# Patient Record
Sex: Female | Born: 1940 | Race: Asian | Hispanic: No | Marital: Married | Smoking: Never smoker
Health system: Southern US, Community
[De-identification: ages and names within clinical notes are randomized; demographics above are authoritative.]

## PROBLEM LIST (undated history)

## (undated) DIAGNOSIS — I10 Essential (primary) hypertension: Secondary | ICD-10-CM

## (undated) DIAGNOSIS — I5189 Other ill-defined heart diseases: Secondary | ICD-10-CM

## (undated) DIAGNOSIS — I213 ST elevation (STEMI) myocardial infarction of unspecified site: Secondary | ICD-10-CM

## (undated) DIAGNOSIS — E785 Hyperlipidemia, unspecified: Secondary | ICD-10-CM

## (undated) DIAGNOSIS — J45909 Unspecified asthma, uncomplicated: Secondary | ICD-10-CM

## (undated) DIAGNOSIS — I639 Cerebral infarction, unspecified: Secondary | ICD-10-CM

## (undated) DIAGNOSIS — R630 Anorexia: Secondary | ICD-10-CM

## (undated) DIAGNOSIS — D333 Benign neoplasm of cranial nerves: Secondary | ICD-10-CM

## (undated) DIAGNOSIS — G919 Hydrocephalus, unspecified: Secondary | ICD-10-CM

## (undated) HISTORY — DX: Hydrocephalus, unspecified: G91.9

## (undated) HISTORY — DX: Benign neoplasm of cranial nerves: D33.3

## (undated) HISTORY — DX: ST elevation (STEMI) myocardial infarction of unspecified site: I21.3

## (undated) HISTORY — DX: Hyperlipidemia, unspecified: E78.5

## (undated) HISTORY — DX: Other ill-defined heart diseases: I51.89

---

## 2007-12-17 ENCOUNTER — Emergency Department: Payer: Self-pay | Admitting: Emergency Medicine

## 2010-05-19 ENCOUNTER — Emergency Department: Payer: Self-pay | Admitting: Unknown Physician Specialty

## 2010-06-29 ENCOUNTER — Emergency Department: Payer: Self-pay | Admitting: Emergency Medicine

## 2010-07-03 ENCOUNTER — Emergency Department: Payer: Self-pay | Admitting: Emergency Medicine

## 2011-08-03 DIAGNOSIS — I213 ST elevation (STEMI) myocardial infarction of unspecified site: Secondary | ICD-10-CM

## 2011-08-03 HISTORY — DX: ST elevation (STEMI) myocardial infarction of unspecified site: I21.3

## 2011-08-18 ENCOUNTER — Inpatient Hospital Stay (HOSPITAL_COMMUNITY)
Admission: EM | Admit: 2011-08-18 | Discharge: 2011-08-23 | DRG: 249 | Disposition: A | Discharge status: Home Health | Payer: Medicare Other | Attending: Cardiology | Admitting: Cardiology

## 2011-08-18 ENCOUNTER — Encounter (HOSPITAL_COMMUNITY): Payer: Self-pay

## 2011-08-18 ENCOUNTER — Other Ambulatory Visit: Payer: Self-pay

## 2011-08-18 ENCOUNTER — Encounter (HOSPITAL_COMMUNITY)
Admission: EM | Disposition: A | Discharge status: Home Health | Payer: Self-pay | Source: Home / Self Care | Attending: Cardiology

## 2011-08-18 DIAGNOSIS — D72829 Elevated white blood cell count, unspecified: Secondary | ICD-10-CM | POA: Diagnosis present

## 2011-08-18 DIAGNOSIS — R4701 Aphasia: Secondary | ICD-10-CM | POA: Diagnosis present

## 2011-08-18 DIAGNOSIS — I10 Essential (primary) hypertension: Secondary | ICD-10-CM

## 2011-08-18 DIAGNOSIS — I219 Acute myocardial infarction, unspecified: Secondary | ICD-10-CM

## 2011-08-18 DIAGNOSIS — I2510 Atherosclerotic heart disease of native coronary artery without angina pectoris: Secondary | ICD-10-CM | POA: Diagnosis present

## 2011-08-18 DIAGNOSIS — R079 Chest pain, unspecified: Secondary | ICD-10-CM

## 2011-08-18 DIAGNOSIS — I2119 ST elevation (STEMI) myocardial infarction involving other coronary artery of inferior wall: Principal | ICD-10-CM | POA: Diagnosis present

## 2011-08-18 DIAGNOSIS — R634 Abnormal weight loss: Secondary | ICD-10-CM | POA: Diagnosis present

## 2011-08-18 DIAGNOSIS — G919 Hydrocephalus, unspecified: Secondary | ICD-10-CM | POA: Diagnosis present

## 2011-08-18 DIAGNOSIS — Z8673 Personal history of transient ischemic attack (TIA), and cerebral infarction without residual deficits: Secondary | ICD-10-CM

## 2011-08-18 DIAGNOSIS — G911 Obstructive hydrocephalus: Secondary | ICD-10-CM | POA: Diagnosis present

## 2011-08-18 DIAGNOSIS — E7800 Pure hypercholesterolemia, unspecified: Secondary | ICD-10-CM | POA: Diagnosis present

## 2011-08-18 DIAGNOSIS — Z23 Encounter for immunization: Secondary | ICD-10-CM

## 2011-08-18 DIAGNOSIS — Z79899 Other long term (current) drug therapy: Secondary | ICD-10-CM

## 2011-08-18 DIAGNOSIS — D333 Benign neoplasm of cranial nerves: Secondary | ICD-10-CM | POA: Diagnosis present

## 2011-08-18 DIAGNOSIS — I213 ST elevation (STEMI) myocardial infarction of unspecified site: Secondary | ICD-10-CM

## 2011-08-18 DIAGNOSIS — E785 Hyperlipidemia, unspecified: Secondary | ICD-10-CM | POA: Diagnosis present

## 2011-08-18 HISTORY — DX: Essential (primary) hypertension: I10

## 2011-08-18 HISTORY — DX: Cerebral infarction, unspecified: I63.9

## 2011-08-18 HISTORY — PX: LEFT HEART CATHETERIZATION WITH CORONARY ANGIOGRAM: SHX5451

## 2011-08-18 HISTORY — PX: CORONARY STENT PLACEMENT: SHX1402

## 2011-08-18 HISTORY — PX: PERCUTANEOUS CORONARY STENT INTERVENTION (PCI-S): SHX5485

## 2011-08-18 HISTORY — DX: Anorexia: R63.0

## 2011-08-18 LAB — CBC
HCT: 40.5 % (ref 36.0–46.0)
HCT: 40.7 % (ref 36.0–46.0)
Hemoglobin: 13.7 g/dL (ref 12.0–15.0)
Hemoglobin: 12.9 g/dL (ref 12.0–15.0)
MCH: 31.6 pg (ref 26.0–34.0)
MCH: 29.8 pg (ref 26.0–34.0)
MCHC: 33.8 g/dL (ref 30.0–36.0)
MCHC: 31.7 g/dL (ref 30.0–36.0)
MCV: 93.3 fL (ref 78.0–100.0)
MCV: 94 fL (ref 78.0–100.0)
Platelets: 229 10*3/uL (ref 150–400)
Platelets: 215 10*3/uL (ref 150–400)
RBC: 4.34 MIL/uL (ref 3.87–5.11)
RBC: 4.33 MIL/uL (ref 3.87–5.11)
RDW: 12.4 % (ref 11.5–15.5)
RDW: 12.5 % (ref 11.5–15.5)
WBC: 12.3 10*3/uL — ABNORMAL HIGH (ref 4.0–10.5)
WBC: 11.9 10*3/uL — ABNORMAL HIGH (ref 4.0–10.5)

## 2011-08-18 LAB — CARDIAC PANEL(CRET KIN+CKTOT+MB+TROPI)
CK, MB: 93.8 ng/mL (ref 0.3–4.0)
Relative Index: 6.6 — ABNORMAL HIGH (ref 0.0–2.5)
Total CK: 1425 U/L — ABNORMAL HIGH (ref 7–177)
Troponin I: 11.33 ng/mL (ref <0.30)

## 2011-08-18 LAB — BASIC METABOLIC PANEL
BUN: 26 mg/dL — ABNORMAL HIGH (ref 6–23)
CO2: 28 mEq/L (ref 19–32)
Calcium: 9.4 mg/dL (ref 8.4–10.5)
Chloride: 104 mEq/L (ref 96–112)
Creatinine, Ser: 0.81 mg/dL (ref 0.50–1.10)
GFR calc Af Amer: 85 mL/min — ABNORMAL LOW (ref >90)
GFR calc non Af Amer: 73 mL/min — ABNORMAL LOW (ref >90)
Glucose, Bld: 142 mg/dL — ABNORMAL HIGH (ref 70–99)
Potassium: 4.1 mEq/L (ref 3.5–5.1)
Sodium: 142 mEq/L (ref 135–145)

## 2011-08-18 LAB — COMPREHENSIVE METABOLIC PANEL
ALT: 37 U/L — ABNORMAL HIGH (ref 0–35)
AST: 115 U/L — ABNORMAL HIGH (ref 0–37)
Albumin: 3.8 g/dL (ref 3.5–5.2)
Alkaline Phosphatase: 66 U/L (ref 39–117)
BUN: 20 mg/dL (ref 6–23)
CO2: 23 mEq/L (ref 19–32)
Calcium: 9.5 mg/dL (ref 8.4–10.5)
Chloride: 101 mEq/L (ref 96–112)
Creatinine, Ser: 0.66 mg/dL (ref 0.50–1.10)
GFR calc Af Amer: >90 mL/min (ref >90)
GFR calc non Af Amer: 89 mL/min — ABNORMAL LOW (ref >90)
Glucose, Bld: 138 mg/dL — ABNORMAL HIGH (ref 70–99)
Potassium: 4.3 mEq/L (ref 3.5–5.1)
Sodium: 137 mEq/L (ref 135–145)
Total Bilirubin: 0.3 mg/dL (ref 0.3–1.2)
Total Protein: 8 g/dL (ref 6.0–8.3)

## 2011-08-18 LAB — DIFFERENTIAL
Basophils Absolute: 0 10*3/uL (ref 0.0–0.1)
Basophils Absolute: 0 10*3/uL (ref 0.0–0.1)
Basophils Relative: 0 % (ref 0–1)
Basophils Relative: 0 % (ref 0–1)
Eosinophils Absolute: 0.1 10*3/uL (ref 0.0–0.7)
Eosinophils Absolute: 0 10*3/uL (ref 0.0–0.7)
Eosinophils Relative: 1 % (ref 0–5)
Eosinophils Relative: 0 % (ref 0–5)
Lymphocytes Relative: 24 % (ref 12–46)
Lymphocytes Relative: 9 % — ABNORMAL LOW (ref 12–46)
Lymphs Abs: 2.9 10*3/uL (ref 0.7–4.0)
Lymphs Abs: 1.1 10*3/uL (ref 0.7–4.0)
Monocytes Absolute: 0.7 10*3/uL (ref 0.1–1.0)
Monocytes Absolute: 0.2 10*3/uL (ref 0.1–1.0)
Monocytes Relative: 5 % (ref 3–12)
Monocytes Relative: 2 % — ABNORMAL LOW (ref 3–12)
Neutro Abs: 8.5 10*3/uL — ABNORMAL HIGH (ref 1.7–7.7)
Neutro Abs: 10.5 10*3/uL — ABNORMAL HIGH (ref 1.7–7.7)
Neutrophils Relative %: 69 % (ref 43–77)
Neutrophils Relative %: 89 % — ABNORMAL HIGH (ref 43–77)

## 2011-08-18 LAB — PROTIME-INR
INR: 0.9 (ref 0.00–1.49)
INR: 1.15 (ref 0.00–1.49)
Prothrombin Time: 12.3 seconds (ref 11.6–15.2)
Prothrombin Time: 14.9 seconds (ref 11.6–15.2)

## 2011-08-18 LAB — MRSA PCR SCREENING: MRSA by PCR: NEGATIVE

## 2011-08-18 LAB — APTT
aPTT: 29 seconds (ref 24–37)
aPTT: 97 seconds — ABNORMAL HIGH (ref 24–37)

## 2011-08-18 SURGERY — LEFT HEART CATHETERIZATION WITH CORONARY ANGIOGRAM
Anesthesia: LOCAL | Site: Groin | Laterality: Right

## 2011-08-18 MED ORDER — NITROGLYCERIN 0.4 MG SL SUBL
0.4000 mg | SUBLINGUAL_TABLET | SUBLINGUAL | Status: DC | PRN
  Administered 2011-08-19: 0.4 mg via SUBLINGUAL
  Filled 2011-08-18 (×2): qty 25

## 2011-08-18 MED ORDER — MIDAZOLAM HCL 2 MG/2ML IJ SOLN
INTRAMUSCULAR | Status: AC
  Filled 2011-08-18: qty 2

## 2011-08-18 MED ORDER — BIVALIRUDIN 250 MG IV SOLR
INTRAVENOUS | Status: AC
  Filled 2011-08-18: qty 250

## 2011-08-18 MED ORDER — ROSUVASTATIN CALCIUM 20 MG PO TABS
20.0000 mg | ORAL_TABLET | Freq: Every day | ORAL | Status: DC
  Administered 2011-08-18 – 2011-08-19 (×2): 20 mg via ORAL
  Filled 2011-08-18 (×2): qty 1

## 2011-08-18 MED ORDER — FENTANYL CITRATE 0.05 MG/ML IJ SOLN
INTRAMUSCULAR | Status: AC
  Filled 2011-08-18: qty 2

## 2011-08-18 MED ORDER — HEPARIN BOLUS VIA INFUSION
4000.0000 [IU] | Freq: Once | INTRAVENOUS | Status: AC
  Administered 2011-08-18: 4000 [IU] via INTRAVENOUS

## 2011-08-18 MED ORDER — NITROGLYCERIN 0.2 MG/ML ON CALL CATH LAB
INTRAVENOUS | Status: AC
  Filled 2011-08-18: qty 1

## 2011-08-18 MED ORDER — METOPROLOL TARTRATE 25 MG PO TABS
25.0000 mg | ORAL_TABLET | Freq: Two times a day (BID) | ORAL | Status: DC
  Administered 2011-08-18 – 2011-08-23 (×10): 25 mg via ORAL
  Filled 2011-08-18 (×11): qty 1

## 2011-08-18 MED ORDER — NITROGLYCERIN IN D5W 200-5 MCG/ML-% IV SOLN
2.0000 ug/min | Freq: Once | INTRAVENOUS | Status: AC
  Administered 2011-08-18: 10 ug/min via INTRAVENOUS

## 2011-08-18 MED ORDER — ATROPINE SULFATE 1 MG/ML IJ SOLN
INTRAMUSCULAR | Status: AC
  Filled 2011-08-18: qty 1

## 2011-08-18 MED ORDER — ONDANSETRON HCL 4 MG/2ML IJ SOLN: 4.0000 mg | Freq: Four times a day (QID) | INTRAMUSCULAR | Status: DC | PRN

## 2011-08-18 MED ORDER — ACETAMINOPHEN 325 MG PO TABS: 650.0000 mg | ORAL_TABLET | ORAL | Status: DC | PRN

## 2011-08-18 MED ORDER — DOCUSATE SODIUM 100 MG PO CAPS
100.0000 mg | ORAL_CAPSULE | Freq: Every day | ORAL | Status: DC
  Administered 2011-08-18 – 2011-08-22 (×5): 100 mg via ORAL
  Filled 2011-08-18: qty 2
  Filled 2011-08-18 (×5): qty 1

## 2011-08-18 MED ORDER — SODIUM CHLORIDE 0.9 % IV SOLN
250.0000 mL | INTRAVENOUS | Status: DC | PRN
  Administered 2011-08-20: 250 mL via INTRAVENOUS

## 2011-08-18 MED ORDER — ASPIRIN EC 325 MG PO TBEC
325.0000 mg | DELAYED_RELEASE_TABLET | Freq: Every day | ORAL | Status: DC
  Administered 2011-08-18 – 2011-08-19 (×2): 325 mg via ORAL
  Filled 2011-08-18 (×3): qty 1

## 2011-08-18 MED ORDER — CLOPIDOGREL BISULFATE 300 MG PO TABS: 600.0000 mg | ORAL_TABLET | Freq: Once | ORAL | Status: DC

## 2011-08-18 MED ORDER — CLOPIDOGREL BISULFATE 75 MG PO TABS
75.0000 mg | ORAL_TABLET | Freq: Every day | ORAL | Status: DC
  Administered 2011-08-19 – 2011-08-23 (×5): 75 mg via ORAL
  Filled 2011-08-18 (×6): qty 1

## 2011-08-18 MED ORDER — HEPARIN SODIUM (PORCINE) 5000 UNIT/ML IJ SOLN
5000.0000 [IU] | Freq: Three times a day (TID) | INTRAMUSCULAR | Status: DC
  Administered 2011-08-19 – 2011-08-23 (×13): 5000 [IU] via SUBCUTANEOUS
  Filled 2011-08-18 (×16): qty 1

## 2011-08-18 MED ORDER — HEPARIN (PORCINE) IN NACL 2-0.9 UNIT/ML-% IJ SOLN
INTRAMUSCULAR | Status: AC
  Filled 2011-08-18: qty 2000

## 2011-08-18 MED ORDER — CLOPIDOGREL BISULFATE 300 MG PO TABS
600.0000 mg | ORAL_TABLET | Freq: Once | ORAL | Status: AC
  Administered 2011-08-18: 600 mg via ORAL

## 2011-08-18 MED ORDER — LIDOCAINE HCL (PF) 1 % IJ SOLN
INTRAMUSCULAR | Status: AC
  Filled 2011-08-18: qty 30

## 2011-08-18 MED ORDER — SODIUM CHLORIDE 0.9 % IJ SOLN
3.0000 mL | Freq: Two times a day (BID) | INTRAMUSCULAR | Status: DC
  Administered 2011-08-20 – 2011-08-23 (×7): 3 mL via INTRAVENOUS

## 2011-08-18 MED ORDER — ASPIRIN EC 81 MG PO TBEC
81.0000 mg | DELAYED_RELEASE_TABLET | Freq: Every day | ORAL | Status: DC
  Administered 2011-08-19 – 2011-08-23 (×5): 81 mg via ORAL
  Filled 2011-08-18 (×5): qty 1

## 2011-08-18 MED ORDER — DOPAMINE-DEXTROSE 3.2-5 MG/ML-% IV SOLN
INTRAVENOUS | Status: AC
  Filled 2011-08-18: qty 250

## 2011-08-18 MED ORDER — CLOPIDOGREL BISULFATE 300 MG PO TABS
ORAL_TABLET | ORAL | Status: AC
  Filled 2011-08-18: qty 2

## 2011-08-18 MED ORDER — HEPARIN SOD (PORCINE) IN D5W 100 UNIT/ML IV SOLN
900.0000 [IU]/h | INTRAVENOUS | Status: DC
  Administered 2011-08-18: 900 [IU]/h via INTRAVENOUS
  Filled 2011-08-18: qty 250

## 2011-08-18 MED ORDER — CLOPIDOGREL BISULFATE 75 MG PO TABS
75.0000 mg | ORAL_TABLET | Freq: Every day | ORAL | Status: DC
  Filled 2011-08-18: qty 1

## 2011-08-18 MED ORDER — SODIUM CHLORIDE 0.9 % IJ SOLN: 3.0000 mL | INTRAMUSCULAR | Status: DC | PRN

## 2011-08-18 MED ORDER — EPTIFIBATIDE 75 MG/100ML IV SOLN
INTRAVENOUS | Status: AC
  Filled 2011-08-18: qty 100

## 2011-08-18 NOTE — Progress Notes (Signed)
ANTICOAGULATION CONSULT NOTE - Initial Consult  Pharmacy Consult for heparin  Indication: chest pain/ACS  No Known Allergies  Patient Measurements: Height: 5\' 2"  (157.5 cm) Weight: 152 lb (68.947 kg) IBW/kg (Calculated) : 50.1  Heparin Dosing Weight: 65 kg  Vital Signs: Temp: 97.4 F (36.3 C) (02/16 1749) BP: 154/90 mmHg (02/16 1749)  Labs:  Basename 08/18/11 1745  HGB 13.7  HCT 40.5  PLT 229  APTT --  LABPROT --  INR --  HEPARINUNFRC --  CREATININE --  CKTOTAL --  CKMB --  TROPONINI --   CrCl is unknown because no creatinine reading has been taken.  Medical History: Past Medical History  Diagnosis Date  . Stroke   . Hypertension     Medications:  Scheduled:    . lidocaine      . clopidogrel  600 mg Oral Once  . clopidogrel  600 mg Oral Once  . heparin  4,000 Units Intravenous Once  . nitroGLYCERIN  2-200 mcg/min Intravenous Once    Assessment: 3 YOF admitted for STEMI, awaiting for Cath, to start heparin infusion. 4000 units bolus was already given. Hgb and plt wnl, baseline PT/INR and aPTT are pending.   Goal of Therapy:  Heparin level 0.3-0.7 units/ml   Plan:  1. Heparin infusion 900 units/hr 2. F/u plan after cath. 3. F/u heparin level 6 hrs after infusion start (0030 on 2/17) if not go to cath lab before midnight.   Riki Rusk 08/18/2011,6:23 PM

## 2011-08-18 NOTE — ED Notes (Signed)
Waiting for cath lab to empty to take patient upstairs

## 2011-08-18 NOTE — ED Notes (Signed)
Pt waiting for transport to Cath lab.  Dr. Katha Cabal with Cardiology at bedside.  Orders received for Heparin, Nitro gtt, & Plavix.  Dr. Lynelle Doctor placed orders in system.  Pt's son at bedside.  Will cont to monitor until transport to cath lab.

## 2011-08-18 NOTE — ED Provider Notes (Signed)
Pt presented to the Emergency room as a transfer from [facility] for a STEMI.  Her visit is complicated by a language barrier.  We do not know what language she speaks.  [place] interpreter was used however the patient did not respond at all.  They are attempting to get a family input. Cardiologist is at the bedside .  The patient is being cared for by them.    Celene Kras, MD 08/18/11 [phone]

## 2011-08-18 NOTE — ED Notes (Signed)
EKG handed to cardiologist

## 2011-08-18 NOTE — ED Notes (Signed)
Pt called EMS for "sick call"  abd pain for 6 hours, inferior MI on 12 lead, 2 IVs,  ASA 324 mg, 3 sprays of NTG,

## 2011-08-18 NOTE — ED Notes (Signed)
EKG was performed by DJ, EMT

## 2011-08-18 NOTE — ED Notes (Signed)
Heparin 4000 unit bolus given

## 2011-08-18 NOTE — Op Note (Signed)
Cardiac Catheterization Procedure Note  Name: [name] MRN: [id number] DOB: December 27, 1940  Procedure: Left Heart Cath, Selective Coronary Angiography, LV angiography,  PTCA/Stent of the mid RCA  Indication: Year-old [name] presented with chief complaint of abdominal pain. ECG showed 4 mm of ST segment elevation in the inferior leads. Of note there were significant delays in door to balloon time. The patient did not speak English and there was difficulty obtaining consent. There were also simultaneous code STEMI patients with only one lab available resulting in delay of this procedure until the first procedure was complete. This patient was also uncooperative until she was adequately sedated.  Diagnostic Procedure Details: The right groin was prepped, draped, and anesthetized with 1% lidocaine. Using the modified Seldinger technique, a 6 French sheath was introduced into the right femoral artery. Standard Judkins catheters were used for selective coronary angiography and left ventriculography. Catheter exchanges were performed over a wire.  The diagnostic procedure was well-tolerated without immediate complications.  PROCEDURAL FINDINGS Hemodynamics: AO 166/84 with a mean of 117 mm Hg LV 166/22 mmHg  Coronary angiography: Coronary dominance: right  Left mainstem: There is mild diffuse narrowing of the left main up to 20%.  Left anterior descending (LAD): There is a 90% stenosis in the proximal LAD. The proximal vessel is diffusely diseased. The mid LAD is very tortuous and diffusely diseased of moderate degree up to 70%. The first diagonal also has a moderate stenosis proximally up to 80%.  Left circumflex (LCx): The left circumflex gives rise to a very small first marginal branch and then terminates in a larger marginal branch. There is diffuse disease in the mid vessel up to 70%.  Right coronary artery (RCA): The right coronary has an anterior takeoff. It is occluded  proximally.  Left ventriculography: Performed at the end of the procedure Left ventricular systolic function is overall normal, LVEF is estimated at 55-65%, there is mild inferior wall hypokinesis, there is no significant mitral regurgitation   PCI Procedure Note:  Following the diagnostic procedure, the decision was made to proceed with PCI.  Weight-based bivalirudin was given for anticoagulation. Once a therapeutic ACT was achieved, a 6 French left Amplatz 0.75 guide catheter was inserted.  A pro-water coronary guidewire was used to cross the lesion.  With wire crossing there was reperfusion with a high-grade stenosis in the mid right coronary with significant thrombus burden. Thrombectomy extraction was performed with an expressway catheter. The lesion was predilated with a 2.5 mm balloon.  The lesion was then stented with a 3.0 x 22 mm integrity bare-metal stent.   The stent was postdilated with a 3.25 mm noncompliant balloon.  Following PCI, there was less than 10 % residual stenosis and TIMI-3 flow. The distal right coronary was occluded after the first posterior lateral branch. The PDA was severely and diffusely diseased in the proximal and mid vessel up to 95%. Femoral hemostasis was achieved with an Angio-Seal device.  The patient tolerated the PCI procedure well. There were no immediate procedural complications.  The patient was transferred to the intensive care unit for further monitoring.  PCI Data: Vessel - RCA/Segment - mid Percent Stenosis (pre)  100% TIMI-flow 0 Stent 3.0 x 22 mm integrity stent Percent Stenosis (post) less than 10% TIMI-flow (post) 3  Final Conclusions:   1. 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 2. Good left ventricular systolic function. 3. Successful stenting of the mid right coronary with a bare-metal stent.  Recommendations: Patient will be treated initially  with dual antiplatelet therapy. I think she will need coronary bypass surgery  for definitive revascularization.  Tanya Crothers [place] 08/18/2011, 8:39 PM

## 2011-08-18 NOTE — H&P (Signed)
[name] is an 71 y.o. female.    Chief Complaint: Epigastric pain  HPI: 71 y/o [name] who speaks no [place].  History was obtained from patient's son ([name]) via a [place] interpreter.  Patient has a PMH of HTN and a recent diagnosis of "brain tumor" (seeing Dr. Deneen Harts at [facility]). She was in her usual state of health up until about 11 am today when she started to complain of epigastric pain.  She denies any chest pain, nausea, vomiting, diaphoresis, PND, orthopnea or syncope.  Due to persistent epigastric pain, EMS were called who obtained a 12-lead ECG and found the patient to have 3mm ST elevation in lead II, III and aVF.  She received 3 SL NTG and 4-baby Aspirin en-route to ER. She arrived in ER with no chest pain, but she still has epigastric pain.  In ER, she was loaded on Plavix (600mg ) and started on Heparin drip.  Her initial blood pressure in ER was 154/90 with a heart rate of 58 bpm.  She is not in distress and she is not in heart failure by examination.  I discussed the clinical situation with the patient's son.  He has signed consent for cardiac catheterization.   Past Medical History  Diagnosis Date  . Stroke   . Hypertension     History reviewed. No pertinent past surgical history.  History reviewed. No pertinent family history. Social History:  does not have a smoking history on file. She does not have any smokeless tobacco history on file. Her alcohol and drug histories not on file.  Allergies: No Known Allergies  Medications Prior to Admission  Medication Dose Route Frequency Provider Last Rate Last Dose  . lidocaine (XYLOCAINE) 1 % injection           . DISCONTD: bivalirudin (ANGIOMAX) 250 MG injection           . DISCONTD: fentaNYL (SUBLIMAZE) 0.05 MG/ML injection           . DISCONTD: heparin 2-0.9 UNIT/ML-% infusion           . DISCONTD: midazolam (VERSED) 2 MG/2ML injection           . DISCONTD: nitroGLYCERIN (NTG ON-CALL) 0.2 mg/mL injection            . clopidogrel (PLAVIX) tablet 600 mg  600 mg Oral Once Celene Kras, MD      . heparin ADULT infusion 100 units/ml (25000 units/250 ml)  900 Units/hr Intravenous Continuous Celene Kras, MD      . heparin bolus via infusion 4,000 Units  4,000 Units Intravenous Once Celene Kras, MD      . nitroGLYCERIN 0.2 mg/mL in dextrose 5 % infusion  2-200 mcg/min Intravenous Once Celene Kras, MD 3 mL/hr at 08/18/11 1813 10 mcg/min at 08/18/11 1813  . DISCONTD: clopidogrel (PLAVIX) tablet 600 mg  600 mg Oral Once Celene Kras, MD       No current outpatient prescriptions on file as of 08/18/2011.   Home Medication  1. Metoprolol 25 mg q daily 2.  Tramadol 50 mg q 6 hours PRN  Results for orders placed during the hospital encounter of 08/18/11 (from the past 48 hour(s))  CBC     Status: Abnormal   Collection Time   08/18/11  5:45 PM      Component Value Range Comment   WBC 12.3 (*) 4.0 - 10.5 (K/uL)    RBC 4.34  3.87 - 5.11 (  MIL/uL)    Hemoglobin 13.7  12.0 - 15.0 (g/dL)    HCT 16.1  09.6 - 04.5 (%)    MCV 93.3  78.0 - 100.0 (fL)    MCH 31.6  26.0 - 34.0 (pg)    MCHC 33.8  30.0 - 36.0 (g/dL)    RDW 40.9  81.1 - 91.4 (%)    Platelets 229  150 - 400 (K/uL)   DIFFERENTIAL     Status: Abnormal   Collection Time   08/18/11  5:45 PM      Component Value Range Comment   Neutrophils Relative 69  43 - 77 (%)    Neutro Abs 8.5 (*) 1.7 - 7.7 (K/uL)    Lymphocytes Relative 24  12 - 46 (%)    Lymphs Abs 2.9  0.7 - 4.0 (K/uL)    Monocytes Relative 5  3 - 12 (%)    Monocytes Absolute 0.7  0.1 - 1.0 (K/uL)    Eosinophils Relative 1  0 - 5 (%)    Eosinophils Absolute 0.1  0.0 - 0.7 (K/uL)    Basophils Relative 0  0 - 1 (%)    Basophils Absolute 0.0  0.0 - 0.1 (K/uL)    No results found.  Review of Systems  Constitutional: Negative.  Negative for fever, chills, weight loss, malaise/fatigue and diaphoresis.  HENT: Negative.  Negative for hearing loss, ear pain, neck pain, tinnitus and ear discharge.    Eyes: Negative.  Negative for blurred vision, double vision, photophobia, pain, discharge and redness.  Respiratory: Negative.  Negative for cough, hemoptysis, sputum production, shortness of breath and wheezing.   Cardiovascular: Negative.  Negative for chest pain, palpitations, orthopnea and claudication.  Gastrointestinal: Positive for abdominal pain. Negative for heartburn, nausea, vomiting, diarrhea, constipation and blood in stool.  Genitourinary: Negative.  Negative for dysuria, urgency, frequency, hematuria and flank pain.  Musculoskeletal: Negative.  Negative for myalgias, back pain and joint pain.  Skin: Negative.  Negative for itching and rash.  Neurological: Negative.  Negative for dizziness, tingling, tremors, seizures, weakness and headaches.  Endo/Heme/Allergies: Negative for environmental allergies. Does not bruise/bleed easily.  Psychiatric/Behavioral: Negative.  Negative for depression and hallucinations.    Blood pressure 154/90, temperature 97.4 F (36.3 C), resp. rate 13, height 5\' 2"  (1.575 m), weight 68.947 kg (152 lb), SpO2 94.00%. Physical Exam  Constitutional: She appears well-developed and well-nourished. No distress.  HENT:  Head: Normocephalic and atraumatic.  Eyes: EOM are normal. Pupils are equal, round, and reactive to light.  Neck: Neck supple. No JVD present.  Cardiovascular: Normal rate, regular rhythm and normal heart sounds.  Exam reveals no gallop and no friction rub.   No murmur heard. Respiratory: Effort normal and breath sounds normal. No stridor. No respiratory distress. She has no wheezes. She has no rales. She exhibits no tenderness.  GI: Soft. Bowel sounds are normal. She exhibits no distension. There is no tenderness. There is no rebound.  Musculoskeletal: Normal range of motion. She exhibits no tenderness.  Neurological: She is alert. No cranial nerve deficit.  Skin: Skin is warm and dry. She is not diaphoretic. No erythema.  Psychiatric:  She has a normal mood and affect.     Assessment  1.  Inferior STEMI 2.  Epigastric pain (anginal equivalent) 3.  HTN  Plan  Patient received 3 SL NTG and 4 baby Aspirin by EMS.  She has been loaded on Plavix 600mg  and is currently on Heparin and Nitro drip.  She is  currently hemodynamically stable. We will send the patient to the cardiac cath lab for diagnostic cath and PCI. Patient's Son has already signed consent for cardiac cath. In the meantime, we will obtain serial cardiac markers, restart her on Metoprolol and obtain a transthoracic echocardiogram in the morning to evaluate her left ventricular systolic and diastolic function.  Mabelle Mungin E 08/18/2011, 6:26 PM

## 2011-08-18 NOTE — ED Notes (Signed)
Pt transported to Cath lab. Pt remains alertx4. Report given to cath lab.

## 2011-08-18 NOTE — Interval H&P Note (Signed)
History and Physical Interval Note:  08/18/2011 8:33 PM  [name]  has presented today for surgery, with the diagnosis of STEMI  The various methods of treatment have been discussed with the patient and family. After consideration of risks, benefits and other options for treatment, the patient has consented to  Procedure(s) (LRB): LEFT HEART CATHETERIZATION WITH CORONARY ANGIOGRAM (N/A) PERCUTANEOUS CORONARY STENT INTERVENTION (PCI-S) (Right) as a surgical intervention .  The patients' history has been reviewed, patient examined, no change in status, stable for surgery.  I have reviewed the patients' chart and labs.  Questions were answered to the patient's satisfaction.     Theron Arista [facility]

## 2011-08-19 ENCOUNTER — Encounter (HOSPITAL_COMMUNITY): Payer: Self-pay | Admitting: Thoracic Surgery (Cardiothoracic Vascular Surgery)

## 2011-08-19 ENCOUNTER — Inpatient Hospital Stay (HOSPITAL_COMMUNITY): Payer: Medicare Other

## 2011-08-19 DIAGNOSIS — I2510 Atherosclerotic heart disease of native coronary artery without angina pectoris: Secondary | ICD-10-CM

## 2011-08-19 DIAGNOSIS — I219 Acute myocardial infarction, unspecified: Secondary | ICD-10-CM

## 2011-08-19 LAB — LIPID PANEL
Cholesterol: 306 mg/dL — ABNORMAL HIGH (ref 0–200)
HDL: 60 mg/dL (ref >39)
LDL Cholesterol: 221 mg/dL — ABNORMAL HIGH (ref 0–99)
Total CHOL/HDL Ratio: 5.1 RATIO
Triglycerides: 127 mg/dL (ref <150)
VLDL: 25 mg/dL (ref 0–40)

## 2011-08-19 LAB — BASIC METABOLIC PANEL
BUN: 19 mg/dL (ref 6–23)
CO2: 25 mEq/L (ref 19–32)
Calcium: 9.6 mg/dL (ref 8.4–10.5)
Chloride: 102 mEq/L (ref 96–112)
Creatinine, Ser: 0.64 mg/dL (ref 0.50–1.10)
GFR calc Af Amer: >90 mL/min (ref >90)
GFR calc non Af Amer: 90 mL/min — ABNORMAL LOW (ref >90)
Glucose, Bld: 122 mg/dL — ABNORMAL HIGH (ref 70–99)
Potassium: 3.7 mEq/L (ref 3.5–5.1)
Sodium: 138 mEq/L (ref 135–145)

## 2011-08-19 LAB — CARDIAC PANEL(CRET KIN+CKTOT+MB+TROPI)
CK, MB: 225.3 ng/mL (ref 0.3–4.0)
CK, MB: 166.6 ng/mL (ref 0.3–4.0)
Relative Index: 7.2 — ABNORMAL HIGH (ref 0.0–2.5)
Relative Index: 5.4 — ABNORMAL HIGH (ref 0.0–2.5)
Total CK: 3144 U/L — ABNORMAL HIGH (ref 7–177)
Total CK: 3110 U/L — ABNORMAL HIGH (ref 7–177)
Troponin I: >25 ng/mL (ref <0.30)
Troponin I: >25 ng/mL (ref <0.30)

## 2011-08-19 LAB — CBC
HCT: 41.8 % (ref 36.0–46.0)
Hemoglobin: 14 g/dL (ref 12.0–15.0)
MCH: 31.2 pg (ref 26.0–34.0)
MCHC: 33.5 g/dL (ref 30.0–36.0)
MCV: 93.1 fL (ref 78.0–100.0)
Platelets: 231 10*3/uL (ref 150–400)
RBC: 4.49 MIL/uL (ref 3.87–5.11)
RDW: 12.5 % (ref 11.5–15.5)
WBC: 13.7 10*3/uL — ABNORMAL HIGH (ref 4.0–10.5)

## 2011-08-19 LAB — HEMOGLOBIN A1C
Hgb A1c MFr Bld: 5.7 % — ABNORMAL HIGH (ref <5.7)
Mean Plasma Glucose: 117 mg/dL — ABNORMAL HIGH (ref <117)

## 2011-08-19 LAB — TSH: TSH: 1.201 u[IU]/mL (ref 0.350–4.500)

## 2011-08-19 MED ORDER — ROSUVASTATIN CALCIUM 40 MG PO TABS
40.0000 mg | ORAL_TABLET | Freq: Every day | ORAL | Status: DC
  Administered 2011-08-20 – 2011-08-23 (×4): 40 mg via ORAL
  Filled 2011-08-19 (×4): qty 1

## 2011-08-19 NOTE — Consult Note (Signed)
CARDIOTHORACIC SURGERY CONSULTATION REPORT  PCP is Jaclyn Shaggy, MD, MD Attending physician is Cassell Clement, MD Referring Provider is  [place], PETER, MD   Reason for consultation:  Severe 3 vessel CAD s/p acute MI  HPI:  Patient is a 71 year old [name] who lives in [place] with her husband and has no previous history of coronary artery disease but risk factors notable for history of hypertension. The patient also has history of some type of brain tumor for which she has been evaluated by Dr. Kelli Hope at [facility]. The patient reportedly developed epigastric tightness in her chest associated with diaphoresis initially 3 days ago. The pain recurred yesterday morning, ultimately prompting the patient be brought via EMS to the emergency department where baseline electrocardiogram demonstrated ST segment elevation in the inferior leads. The patient was taken directly to the cardiac Cath Lab by Dr. [place] where she was found to have acute occlusion of the right coronary artery with severe three-vessel coronary artery disease. She underwent PCI and stenting of the right coronary artery using a bare-metal stent. Left ventricular function was remarkably normal. Cardiothoracic surgical consultation has been requested to consider surgical revascularization.  Past Medical History  Diagnosis Date  . Stroke   . Hypertension   . Brain tumor     evaluated at [facility]  . Aphasia   . Anorexia     History reviewed. No pertinent past surgical history.  History reviewed. No pertinent family history.  Social History History  Substance Use Topics  . Smoking status: Not on file  . Smokeless tobacco: Not on file  . Alcohol Use:     Prior to Admission medications   Medication Sig Start Date End Date Taking? Authorizing Provider  docusate sodium (COLACE) 100 MG capsule Take 100 mg by mouth daily.   Yes Historical Provider, MD  metoprolol tartrate  (LOPRESSOR) 25 MG tablet Take 25 mg by mouth daily.   Yes Historical Provider, MD  polyethylene glycol (MIRALAX / GLYCOLAX) packet Take 17 g by mouth daily as needed. For constipation   Yes Historical Provider, MD  traMADol (ULTRAM) 50 MG tablet Take 50 mg by mouth every 6 (six) hours as needed. For pain   Yes Historical Provider, MD    Current Facility-Administered Medications  Medication Dose Route Frequency Provider Last Rate Last Dose  . 0.9 %  sodium chloride infusion  250 mL Intravenous PRN Celene Kras, MD 10 mL/hr at 08/18/11 2100 250 mL at 08/18/11 2100  . acetaminophen (TYLENOL) tablet 650 mg  650 mg Oral Q4H PRN Peter [place], MD      . aspirin EC tablet 325 mg  325 mg Oral Daily Peter [place], MD   325 mg at 08/19/11 1029  . aspirin EC tablet 81 mg  81 mg Oral Daily Celene Kras, MD   81 mg at 08/19/11 1030  . atropine 1 MG/ML injection           . bivalirudin (ANGIOMAX) 250 MG injection           . clopidogrel (PLAVIX) tablet 600 mg  600 mg Oral Once Celene Kras, MD   600 mg at 08/18/11 1826  . clopidogrel (PLAVIX) tablet 75 mg  75 mg Oral Q breakfast Peter [place], MD   75 mg at 08/19/11 0819  . docusate sodium (COLACE) capsule 100 mg  100 mg Oral Daily Celene Kras, MD   100 mg at 08/19/11  1135  . fentaNYL (SUBLIMAZE) 0.05 MG/ML injection           . heparin 2-0.9 UNIT/ML-% infusion           . heparin injection 5,000 Units  5,000 Units Subcutaneous Q8H Peter [place], MD   5,000 Units at 08/19/11 1450  . metoprolol tartrate (LOPRESSOR) tablet 25 mg  25 mg Oral BID Celene Kras, MD   25 mg at 08/19/11 1134  . midazolam (VERSED) 2 MG/2ML injection           . midazolam (VERSED) 2 MG/2ML injection           . nitroGLYCERIN (NITROSTAT) SL tablet 0.4 mg  0.4 mg Sublingual Q5 Min x 3 PRN Celene Kras, MD      . nitroGLYCERIN 0.2 mg/mL in dextrose 5 % infusion  2-200 mcg/min Intravenous Once Celene Kras, MD 3 mL/hr at 08/18/11 1813 10 mcg/min at 08/18/11 1813  . ondansetron (ZOFRAN) injection  4 mg  4 mg Intravenous Q6H PRN Peter [place], MD      . rosuvastatin (CRESTOR) tablet 40 mg  40 mg Oral Daily Cassell Clement, MD      . sodium chloride 0.9 % injection 3 mL  3 mL Intravenous Q12H Celene Kras, MD      . sodium chloride 0.9 % injection 3 mL  3 mL Intravenous PRN Celene Kras, MD      . DISCONTD: acetaminophen (TYLENOL) tablet 650 mg  650 mg Oral Q4H PRN Celene Kras, MD      . DISCONTD: bivalirudin (ANGIOMAX) 250 MG injection           . DISCONTD: clopidogrel (PLAVIX) tablet 600 mg  600 mg Oral Once Celene Kras, MD      . DISCONTD: clopidogrel (PLAVIX) tablet 75 mg  75 mg Oral Q breakfast Celene Kras, MD      . DISCONTD: DOPamine (INTROPIN) 3.2-5 MG/ML-% infusion           . DISCONTD: eptifibatide (INTEGRILIN) 75 mg / 100 mL infusion           . DISCONTD: heparin ADULT infusion 100 units/ml (25000 units/250 ml)  900 Units/hr Intravenous Continuous Celene Kras, MD 9 mL/hr at 08/18/11 1828 900 Units/hr at 08/18/11 1828  . DISCONTD: lidocaine (XYLOCAINE) 1 % injection           . DISCONTD: nitroGLYCERIN (NTG ON-CALL) 0.2 mg/mL injection           . DISCONTD: ondansetron (ZOFRAN) injection 4 mg  4 mg Intravenous Q6H PRN Celene Kras, MD      . DISCONTD: rosuvastatin (CRESTOR) tablet 20 mg  20 mg Oral Daily Celene Kras, MD   20 mg at 08/19/11 1027    No Known Allergies  Review of Systems:  Obtained at the patient's bedside with her daughter who speaks [place] well.   General:  Very poor appetite for several months. The patient has lost a fair amount of weight. She no longer eats nor communicates with her family much at all.   Respiratory:  no cough, no wheezing, no hemoptysis, no pain with inspiration or cough, no shortness of breath   Cardiac:   + chest pain or tightness first noted last Wednesday, no exertional SOB, no resting SOB, no PND, no orthopnea, no LE edema, no palpitations, no syncope  GI:   Not eating much.  No longer has any control of bowel function  GU:  No bladder  control  Musculoskeletal: no arthritis, no arthralgia  Vascular:  no pain suggestive of claudication  Neuro:   Progressive confusion dating back nearly one year. The patient no longer recognizes nor communicates with her daughter. She has been completely aphasic for several months. She has no control of bowel or bladder function. She has not eating and has lost weight. She reportedly can no longer walk at all.  Endocrine:  Negative   HEENT:  no loose teeth or painful teeth,  no recent vision changes  Psych:   no anxiety, no depression    Physical Exam:   BP 108/69  Pulse 66  Temp(Src) 98.3 F (36.8 C) (Oral)  Resp 17  Ht 5\' 2"  (1.575 m)  Wt 70 kg (154 lb 5.2 oz)  BMI 28.23 kg/m2  SpO2 96%  General:  Mildly obese in no distress  HEENT:  Unremarkable   Neck:   no JVD, no bruits, no adenopathy   Chest:   clear to auscultation, symmetrical breath sounds, no wheezes, no rhonchi   CV:   RRR, no  murmur   Abdomen:  soft, non-tender, no masses   Extremities:  warm, well-perfused, pulses   Rectal/GU  Deferred  Neuro:   Will not follow commands  Skin:   Clean and dry, no rashes, no breakdown  Labs:   Results for Kylie, [name] (MRN [id number]) as of 08/19/2011 18:09  Ref. Range 08/19/2011 03:55  WBC Latest Range: 4.0-10.5 K/uL 13.7 (H)  RBC Latest Range: 3.87-5.11 MIL/uL 4.49  HGB Latest Range: 12.0-15.0 g/dL 47.8  HCT Latest Range: 36.0-46.0 % 41.8  MCV Latest Range: 78.0-100.0 fL 93.1  MCH Latest Range: 26.0-34.0 pg 31.2  MCHC Latest Range: 30.0-36.0 g/dL 29.5  RDW Latest Range: 11.5-15.5 % 12.5  Platelets Latest Range: 150-400 K/uL 231    Results for YAHAYRA, GEIS (MRN [id number]) as of 08/19/2011 18:09  Ref. Range 08/19/2011 03:55  Sodium Latest Range: 135-145 mEq/L 138  Potassium Latest Range: 3.5-5.1 mEq/L 3.7  Chloride Latest Range: 96-112 mEq/L 102  CO2 Latest Range: 19-32 mEq/L 25  BUN Latest Range: 6-23 mg/dL 19  Creat Latest Range: 0.50-1.10 mg/dL 8.46  Calcium Latest  Range: 8.4-10.5 mg/dL 9.6  GFR calc non Af Amer Latest Range: >90 mL/min 90 (L)  GFR calc Af Amer Latest Range: >90 mL/min >90  Glucose Latest Range: 70-99 mg/dL 962 (H)    Results for PRINCESS, KARNES (MRN [id number]) as of 08/19/2011 18:09  Ref. Range 08/18/2011 21:54 08/19/2011 03:55 08/19/2011 07:12 08/19/2011 10:22 08/19/2011 14:00  CK, MB Latest Range: 0.3-4.0 ng/mL 93.8 (HH) 225.3 (HH)  166.6 (HH)   CK Total Latest Range: 7-177 U/L 1425 (H) 3144 (H)  3110 (H)   Troponin I Latest Range: <0.30 ng/mL 11.33 (HH) >25.00 (HH)  >25.00 [facility])     Diagnostic Tests:  Cardiac Catheterization Procedure Note  Name: Promyse Ardito  MRN: [id number]  DOB: 1940-08-15  PROCEDURAL FINDINGS  Hemodynamics:  AO 166/84 with a mean of 117 mm Hg  LV 166/22 mmHg  Coronary angiography:  Coronary dominance: right  Left mainstem: There is mild diffuse narrowing of the left main up to 20%.  Left anterior descending (LAD): There is a 90% stenosis in the proximal LAD. The proximal vessel is diffusely diseased. The mid LAD is very tortuous and diffusely diseased of moderate degree up to 70%. The first diagonal also has a moderate stenosis proximally up to 80%.  Left circumflex (LCx): The left circumflex gives rise to a very  small first marginal branch and then terminates in a larger marginal branch. There is diffuse disease in the mid vessel up to 70%.  Right coronary artery (RCA): The right coronary has an anterior takeoff. It is occluded proximally.  Left ventriculography: Performed at the end of the procedure Left ventricular systolic function is overall normal, LVEF is estimated at 55-65%, there is mild inferior wall hypokinesis, there is no significant mitral regurgitation  PCI Procedure Note: Following the diagnostic procedure, the decision was made to proceed with PCI. Weight-based bivalirudin was given for anticoagulation. Once a therapeutic ACT was achieved, a 6 French left Amplatz 0.75 guide catheter was inserted. A  pro-water coronary guidewire was used to cross the lesion. With wire crossing there was reperfusion with a high-grade stenosis in the mid right coronary with significant thrombus burden. Thrombectomy extraction was performed with an expressway catheter. The lesion was predilated with a 2.5 mm balloon. The lesion was then stented with a 3.0 x 22 mm integrity bare-metal stent. The stent was postdilated with a 3.25 mm noncompliant balloon. Following PCI, there was less than 10 % residual stenosis and TIMI-3 flow. The distal right coronary was occluded after the first posterior lateral branch. The PDA was severely and diffusely diseased in the proximal and mid vessel up to 95%. Femoral hemostasis was achieved with an Angio-Seal device. The patient tolerated the PCI procedure well. There were no immediate procedural complications. The patient was transferred to the intensive care unit for further monitoring.  PCI Data:  Vessel - RCA/Segment - mid  Percent Stenosis (pre) 100%  TIMI-flow 0  Stent 3.0 x 22 mm integrity stent  Percent Stenosis (post) less than 10%  TIMI-flow (post) 3  Final Conclusions:  1. 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  2. Good left ventricular systolic function.  3. Successful stenting of the mid right coronary with a bare-metal stent.   Impression:  Severe three-vessel coronary artery disease status post acute inferior wall myocardial infarction treated with PCI and stenting using a bare-metal stent. The patient has preserved left ventricular function and residual severe three-vessel coronary artery disease. Based upon coronary anatomy surgical revascularization would probably provide the best long-term result. However, this patient suffers from some type of brain tumor that has left her with severe neurologic functional deficit that would preclude the use of coronary artery bypass grafting as an appropriate means of therapy. According to the patient's  daughter she has been going downhill quite traumatically in recent months. She reportedly can no longer walk, she no longer recognizes her family members, she is eating poorly and losing weight, and she has apparently been aphasic. Details of her previous workup for brain tumor at [facility] are not currently available. However, under the circumstances I cannot recommend surgical revascularization.  Plan:  I recommend either long-term medical therapy or consideration of possible PCI and stenting for palliative treatment of coronary artery disease. Please call if further questions arise.    Salvatore Decent. Cornelius Moras, MD 08/19/2011 5:57 PM

## 2011-08-19 NOTE — Progress Notes (Signed)
Subjective:  The patient is resting comfortably this am after PCI of total RCA occlusion last pm.  She speaks no [place]. Her husband in room understands my questions slightly.  Rhythm NSR.  Objective:  Vital Signs in the last 24 hours: Temp:  [97.4 F (36.3 C)-98.6 F (37 C)] 98.1 F (36.7 C) (02/17 1153) Pulse Rate:  [59-97] 70  (02/17 1134) Resp:  [13-22] 20  (02/17 1000) BP: (103-165)/(48-142) 103/48 mmHg (02/17 1134) SpO2:  [93 %-100 %] 96 % (02/17 1000) Weight:  [152 lb (68.947 kg)-154 lb 5.2 oz (70 kg)] 154 lb 5.2 oz (70 kg) (02/17 0000)  Intake/Output from previous day: 02/16 0701 - 02/17 0700 In: 240 [P.O.:120; I.V.:110] Out: 650 [Urine:650] Intake/Output from this shift: Total I/O In: 80 [P.O.:50; I.V.:30] Out: 30 [Urine:30]     . aspirin EC  325 mg Oral Daily  . aspirin EC  81 mg Oral Daily  . atropine      . bivalirudin      . clopidogrel  600 mg Oral Once  . clopidogrel  75 mg Oral Q breakfast  . docusate sodium  100 mg Oral Daily  . fentaNYL      . heparin      . heparin  4,000 Units Intravenous Once  . heparin  5,000 Units Subcutaneous Q8H  . lidocaine      . metoprolol tartrate  25 mg Oral BID  . midazolam      . midazolam      . nitroGLYCERIN  2-200 mcg/min Intravenous Once  . rosuvastatin  20 mg Oral Daily  . sodium chloride  3 mL Intravenous Q12H  . DISCONTD: clopidogrel  600 mg Oral Once  . DISCONTD: clopidogrel  75 mg Oral Q breakfast      . DISCONTD: heparin 900 Units/hr (08/18/11 1828)    Physical Exam: The patient appears to be in no distress.  Head and neck exam reveals that the pupils are equal and reactive.  The extraocular movements are full.  There is no scleral icterus.  Mouth and pharynx are benign.  No lymphadenopathy.  No carotid bruits.  The jugular venous pressure is normal.  Thyroid is not enlarged or tender.  Chest is clear to percussion and auscultation.  No rales or rhonchi.  Expansion of the chest is  symmetrical.  Heart reveals no abnormal lift or heave.  First and second heart sounds are normal.  There is no murmur gallop rub or click.  The abdomen is soft and nontender.  Bowel sounds are normoactive.  There is no hepatosplenomegaly or mass.  There are no abdominal bruits.  Groin reveals no hematoma.  Extremities reveal no phlebitis or edema.  Pedal pulses are good.  There is no cyanosis or clubbing.  Integument reveals no rash  Lab Results:  Basename 08/19/11 0355 08/18/11 2154  WBC 13.7* 11.9*  HGB 14.0 12.9  PLT 231 215    Basename 08/19/11 0355 08/18/11 2154  NA 138 137  K 3.7 4.3  CL 102 101  CO2 25 23  GLUCOSE 122* 138*  BUN 19 20  CREATININE 0.64 0.66    Basename 08/19/11 1022 08/19/11 0355  TROPONINI >25.00* >25.00*   Hepatic Function Panel  Basename 08/18/11 2154  PROT 8.0  ALBUMIN 3.8  AST 115*  ALT 37*  ALKPHOS 66  BILITOT 0.3  BILIDIR --  IBILI --    Basename 08/19/11 0355  CHOL 306*   No results found for this basename: PROTIME  in the last 72 hours  Imaging: Imaging results have been reviewed.  No chest xray report in chart.  Will order. EKG shows evolving inferior MI Cardiac Studies: Cholesterol very high.  LDL 221 Assessment/Plan:  Patient Active Hospital Problem List: 1. STEMI          Continue dual platelet therapy for now. Will need eventual CABG if other medical problems ? Brain tumor followed at [facility] do not contradict.  I have spoken with Dr. Cornelius Moras. Their team will see, probably tomorrow. I will ask care management to obtain old records from [place]. 2. Marked dyslipidemia     Statin therapy   LOS: 1 day    Cassell Clement 08/19/2011, 12:09 PM

## 2011-08-20 DIAGNOSIS — E7800 Pure hypercholesterolemia, unspecified: Secondary | ICD-10-CM | POA: Diagnosis present

## 2011-08-20 DIAGNOSIS — I219 Acute myocardial infarction, unspecified: Secondary | ICD-10-CM

## 2011-08-20 DIAGNOSIS — I10 Essential (primary) hypertension: Secondary | ICD-10-CM | POA: Diagnosis present

## 2011-08-20 LAB — POCT ACTIVATED CLOTTING TIME: Activated Clotting Time: 672 seconds

## 2011-08-20 MED FILL — Dextrose Inj 5%: INTRAVENOUS | Qty: 50 | Status: AC

## 2011-08-20 NOTE — Progress Notes (Signed)
   CARE MANAGEMENT NOTE 08/20/2011  Patient:  [name], [name]   Account Number:  [id number]  Date Initiated:  08/20/2011  Documentation initiated by:  GRAVES-BIGELOW,Karys Meckley  Subjective/Objective Assessment:   Pt admitted with Epigastric pain. Pt has PMH: of HTN and a recent dx of "brain tumor" (seeing Dr. Deneen Harts at [facility]). 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 Continue dual platelet therapy for now.     Action/Plan:   RN / Sec on floor was able to send release forms and faxed infromation to [facility] to receive records. PT will f/u for d/c disposition. CM will speak to husband about disposition plans.   Anticipated DC Date:  08/27/2011   Anticipated DC Plan:  SKILLED NURSING FACILITY      DC Planning Services  CM consult      Choice offered to / List presented to:             Status of service:  In process, will continue to follow Medicare Important Message given?   (If response is "NO", the following Medicare IM given date fields will be blank) Date Medicare IM given:   Date Additional Medicare IM given:    Discharge Disposition:    Per UR Regulation:    Comments:  08-20-11 [address] RN,BSN [phone] CM did speak to RN Sam and she stated that per family pt had been loosing weight at home- wc bound and incontinent at times. CM called for a [place] interpreter to assist with disposition needs. Pt has been in a wheelchair for 1 year. She has dme rw and bsc. Pt has 4 steps to get into home and no ramp per husband. He bathes her in bed and prepares meals. Pt has cheildren that assists with care also. CM did give pt an advanced directive brochure and he will take it home and have his children read it to him. CM was able to fax release of information forms to [facility] with MD signature. Hopefully records to be sent today. If PT recommends HH services- pt will need [facility] RN also. Husband stated that [facility] will be fine if that is the  recommendation. We did discuss the possibility of SNF. Will continue to f/u for d/c disposition.

## 2011-08-20 NOTE — Progress Notes (Signed)
Cardiology Progress Note Patient Name: [name] Date of Encounter: 08/20/2011, 8:41 AM     Subjective  No overnight events. Patient does not speak english. Resting comfortably in bed with husband at bedside, in no acute distress. Awaiting records from [facility] regarding her brain tumor.   Objective   Telemetry: Sinus rhythm, 60s-80s, occasional PVCs  Medications: . aspirin EC  325 mg Oral Daily  . aspirin EC  81 mg Oral Daily  . clopidogrel  75 mg Oral Q breakfast  . docusate sodium  100 mg Oral Daily  . heparin  5,000 Units Subcutaneous Q8H  . metoprolol tartrate  25 mg Oral BID  . rosuvastatin  40 mg Oral Daily  . sodium chloride  3 mL Intravenous Q12H   Physical Exam: Temp:  [97.6 F (36.4 C)-99.1 F (37.3 C)] 99.1 F (37.3 C) (02/18 0721) Pulse Rate:  [60-100] 68  (02/18 0800) Resp:  [10-25] 19  (02/18 0800) BP: (103-155)/(42-119) 116/61 mmHg (02/18 0800) SpO2:  [90 %-97 %] 96 % (02/18 0800) Weight:  [154 lb 12.2 oz (70.2 kg)] 154 lb 12.2 oz (70.2 kg) (02/18 0500)  General: [name], in no acute distress. Head: Normocephalic, atraumatic, sclera non-icteric, nares are without discharge.  Neck: Supple. Negative for carotid bruits or JVD Lungs: Clear bilaterally to auscultation without wheezes, rales, or rhonchi. Breathing is unlabored. Heart: RRR S1 S2 without murmurs, rubs, or gallops.  Abdomen: Soft, non-tender, non-distended with normoactive bowel sounds. No rebound/guarding. No obvious abdominal masses. Msk:  Strength and tone appear normal for age. Extremities: No edema. No clubbing or cyanosis. Distal pedal pulses are 2+ and equal bilaterally. Neuro: Alert. Moves all extremities spontaneously. Psych:  Does not communicate verbally.   Intake/Output Summary (Last 24 hours) at 08/20/11 0841 Last data filed at 08/20/11 0800  Gross per 24 hour  Intake    390 ml  Output    725 ml  Net   -335 ml    Labs:  [facility] 08/19/11 0355 08/18/11 2154  NA  138 137  K 3.7 4.3  CL 102 101  CO2 25 23  GLUCOSE 122* 138*  BUN 19 20  CREATININE 0.64 0.66  CALCIUM 9.6 9.5   Basename 08/18/11 2154  AST 115*  ALT 37*  ALKPHOS 66  BILITOT 0.3  PROT 8.0  ALBUMIN 3.8   Basename 08/19/11 0355 08/18/11 2154 08/18/11 1745  WBC 13.7* 11.9* --  NEUTROABS -- 10.5* 8.5*  HGB 14.0 12.9 --  HCT 41.8 40.7 --  MCV 93.1 94.0 --  PLT 231 215 --    Basename 08/19/11 1022 08/19/11 0355 08/18/11 2154  CKTOTAL 3110* 3144* 1425*  CKMB 166.6* 225.3* 93.8*  TROPONINI >25.00* >25.00* 11.33*   Basename 08/18/11 2154  HGBA1C 5.7*   Basename 08/19/11 0355  CHOL 306*  HDL 60  LDLCALC 221*  TRIG 127  CHOLHDL 5.1   Basename 08/18/11 2154  TSH 1.201    Radiology/Studies:   08/18/11 - Left Heart Cath, Selective Coronary Angiography, LV angiography, PTCA/Stent of the mid RCA Hemodynamics:  AO 166/84 with a mean of 117 mm Hg  LV 166/22 mmHg  Coronary angiography:  Coronary dominance: right  Left mainstem: There is mild diffuse narrowing of the left main up to 20%.  Left anterior descending (LAD): There is a 90% stenosis in the proximal LAD. The proximal vessel is diffusely diseased. The mid LAD is very tortuous and diffusely diseased of moderate degree up to 70%. The first  diagonal also has a moderate stenosis proximally up to 80%.  Left circumflex (LCx): The left circumflex gives rise to a very small first marginal branch and then terminates in a larger marginal branch. There is diffuse disease in the mid vessel up to 70%.  Right coronary artery (RCA): The right coronary has an anterior takeoff. It is occluded proximally.  Left ventriculography: Performed at the end of the procedure Left ventricular systolic function is overall normal, LVEF is estimated at 55-65%, there is mild inferior wall hypokinesis, there is no significant mitral regurgitation  Final Conclusions:  1. 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  2. Good left  ventricular systolic function.  3. Successful stenting of the mid right coronary with a 3.0 x 22 mm integrity bare-metal stent.  Recommendations: Patient will be treated initially with dual antiplatelet therapy. I think she will need coronary bypass surgery for definitive revascularization.  08/19/2011 - CXR  Findings: Midline trachea.  Cardiomegaly accentuated by AP portable technique.  Age advanced aortic atherosclerosis. No pleural effusion or pneumothorax.  No congestive failure.  Mild scarring about the left midlung laterally.  Mild left base volume loss.  IMPRESSION: Cardiomegaly and low lung volumes, without acute disease.     Assessment and Plan  71 y.o. female w/ PMHx significant for HTN, CVA, and Brain tumor who presented to [facility] on 08/18/11 with an inferior STEMI and underwent emergent cath with BMS to RCA.  1. Inferior STEMI: Patient presented with epigastric pain and inferior ST elevation. She underwent cardiac catheterization revealing severe 3 vessel obstructive CAD & good LVSF (EF55-65%) and treated with BMS to RCA with recommendations for possible CABG. She was evaluated by TCTS who felt her reported h/o brain tumor and severe neurologic functional deficit would preclude CABG as an appropriate means of therapy and would recommend either long-term medical therapy or consideration of possible PCI & stenting for palliative treatment of CAD. Awaiting records from [facility] regarding her brain tumor workup. Cont DAPT w/ ASA & Plavix, and cont BB & statin. Echo pending. Will consult case management for assistance with social needs and dc planning. Will also get PT consultation.  2. Leukocytosis: WBC 13.7 yesterday, up from 11.9. Likely r/t STEMI. TMax 99.1 CXR without acute disease. Check UA.  3. Dyslipidemia: LDL 221. Cont statin  4. Hypertension: BPs stable. Cont current med regimen.  Disposition: Transfer to stepdown today  Signed, HOPE, JESSICA PA-C  Patient seen and  examined and history reviewed. Agree with above findings and plan. Patient has severe neurologic deficit according to family. We will need to review records from [facility] concerning cause and extent of neurologic disease. Clearly not a candidate for CABG at current functional level. If her prognosis is very poor from a neuro standpoint then there is little reason to pursue more aggressive revascularization even with PCI ( Proximal LAD could be stented but the rest of her disease is not suitable for PCI). Will get PT evaluation. May need to consider NHP. May need Neuro input depending on prior assessment.  Theron Arista [facility] 08/20/2011 10:42 AM

## 2011-08-20 NOTE — Progress Notes (Signed)
UR Completed. Simmons, Kalicia Dufresne F [phone]  

## 2011-08-20 NOTE — Progress Notes (Signed)
08-20-11 1509 Gerome Apley [phone] Records obtained from [facility]. Unit secretary placed records in shadow chart. Thanks

## 2011-08-21 ENCOUNTER — Encounter (HOSPITAL_COMMUNITY): Payer: Self-pay | Admitting: *Deleted

## 2011-08-21 DIAGNOSIS — G919 Hydrocephalus, unspecified: Secondary | ICD-10-CM | POA: Diagnosis present

## 2011-08-21 DIAGNOSIS — I2510 Atherosclerotic heart disease of native coronary artery without angina pectoris: Secondary | ICD-10-CM | POA: Diagnosis present

## 2011-08-21 DIAGNOSIS — I517 Cardiomegaly: Secondary | ICD-10-CM

## 2011-08-21 DIAGNOSIS — D333 Benign neoplasm of cranial nerves: Secondary | ICD-10-CM | POA: Diagnosis present

## 2011-08-21 DIAGNOSIS — I2119 ST elevation (STEMI) myocardial infarction involving other coronary artery of inferior wall: Secondary | ICD-10-CM | POA: Diagnosis present

## 2011-08-21 MED ORDER — ENSURE PUDDING PO PUDG
1.0000 | Freq: Three times a day (TID) | ORAL | Status: DC
  Administered 2011-08-21 – 2011-08-22 (×3): 1 via ORAL

## 2011-08-21 MED ORDER — PNEUMOCOCCAL VAC POLYVALENT 25 MCG/0.5ML IJ INJ
0.5000 mL | INJECTION | INTRAMUSCULAR | Status: AC
  Administered 2011-08-23: 0.5 mL via INTRAMUSCULAR
  Filled 2011-08-21: qty 0.5

## 2011-08-21 NOTE — Progress Notes (Signed)
TELEMETRY: Reviewed telemetry pt in NSR occ. PVCs: Filed Vitals:   08/21/11 0500 08/21/11 0600 08/21/11 0700 08/21/11 0806  BP:  107/61    Pulse:      Temp:    99.5 F (37.5 C)  TempSrc:    Axillary  Resp: 18 20 19    Height:      Weight:  68.6 kg (151 lb 3.8 oz)    SpO2:        Intake/Output Summary (Last 24 hours) at 08/21/11 0809 Last data filed at 08/21/11 0600  Gross per 24 hour  Intake    433 ml  Output   1150 ml  Net   -717 ml    SUBJECTIVE  Doesn't speak english. Uncommunicative even with family. No new events overnight.  LABS: Basic Metabolic Panel:  Basename 2011-08-25 0355 08/18/11 2154  NA 138 137  K 3.7 4.3  CL 102 101  CO2 25 23  GLUCOSE 122* 138*  BUN 19 20  CREATININE 0.64 0.66  CALCIUM 9.6 9.5  MG -- --  PHOS -- --   Liver Function Tests:  [facility] 08/18/11 2154  AST 115*  ALT 37*  ALKPHOS 66  BILITOT 0.3  PROT 8.0  ALBUMIN 3.8   No results found for this basename: LIPASE:2,AMYLASE:2 in the last 72 hours CBC:  Basename 08/25/2011 0355 08/18/11 2154 08/18/11 1745  WBC 13.7* 11.9* --  NEUTROABS -- 10.5* 8.5*  HGB 14.0 12.9 --  HCT 41.8 40.7 --  MCV 93.1 94.0 --  PLT 231 215 --   Cardiac Enzymes:  Basename 08-25-11 1022 08/25/11 0355 08/18/11 2154  CKTOTAL 3110* 3144* 1425*  CKMB 166.6* 225.3* 93.8*  CKMBINDEX -- -- --  TROPONINI >25.00* >25.00* 11.33*   BNP: No components found with this basename: POCBNP:3 D-Dimer: No results found for this basename: DDIMER:2 in the last 72 hours Hemoglobin A1C:  Basename 08/18/11 2154  HGBA1C 5.7*   Fasting Lipid Panel:  Basename 08/25/11 0355  CHOL 306*  HDL 60  LDLCALC 221*  TRIG 127  CHOLHDL 5.1  LDLDIRECT --   Thyroid Function Tests:  Basename 08/18/11 2154  TSH 1.201  T4TOTAL --  T3FREE --  THYROIDAB --   Anemia Panel: No results found for this basename: VITAMINB12,FOLATE,FERRITIN,TIBC,IRON,RETICCTPCT in the last 72 hours  Radiology/Studies:  Dg Chest Port 1  View  08/25/11  *RADIOLOGY REPORT*  Clinical Data: History stroke.  Myocardial infarction.  PORTABLE CHEST - 1 VIEW  Comparison: None.  Findings: Midline trachea.  Cardiomegaly accentuated by AP portable technique.  Age advanced aortic atherosclerosis. No pleural effusion or pneumothorax.  No congestive failure.  Mild scarring about the left midlung laterally.  Mild left base volume loss.  IMPRESSION: Cardiomegaly and low lung volumes, without acute disease.  Original Report Authenticated By: Consuello Bossier, M.D.    PHYSICAL EXAM General: Well developed, well nourished, in no acute distress. Head: Normocephalic, atraumatic, sclera non-icteric, no xanthomas, nares are without discharge. Neck: Negative for carotid bruits. JVD not elevated. Lungs: Clear bilaterally to auscultation without wheezes, rales, or rhonchi. Breathing is unlabored. Heart: RRR S1 S2 without murmurs, rubs, or gallops.  Abdomen: Soft, non-tender, non-distended with normoactive bowel sounds. No hepatomegaly. No rebound/guarding. No obvious abdominal masses. Msk:  Unable to follow commands. Extremities: No clubbing, cyanosis or edema.  Distal pedal pulses are 2+ and equal bilaterally. Neuro: Alert. Moves all extremities spontaneously.   ASSESSMENT AND PLAN: 1. Inferior STEMI s/p BMS to RCA. Patient has severe 3 vessel CAD. EF  is well preserved. 2. Progressive neurologic decline. Records from [facility] reviewed. Patient has a left acoustic neuroma. Also has hydrocephalus although lumbar drain trial was negative.  3. Dyslipidemia 4. Htn  Plan: PT consulted. Unclear what patient is able to do. Apparently she is essentially bed bound. Some discussion at [facility] about a shunt but its felt that this may not benefit. Now on dual antiplatelet Rx. Overall prognosis appears poor. I am not inclined to pursue any further cardiac intervention unless there is some hope for neurologic improvement. Need discussion with family concerning long term  goals/ Rx. ? If she will need NHP. Will ask neurology to see here to help determine prognosis/ treatment. Will transfer to telemetry today.  Active Problems:  HTN (hypertension)  Hypercholesterolemia    Signed, Treshawn Allen [place] MD, [facility] 08/21/2011 8:19 AM

## 2011-08-21 NOTE — Progress Notes (Signed)
Pt's husband informed of neuro consult and transfer to telemetry floor plan  Of care for today via pacific telephone interpreter. Husband states no questions of care via interpreter.

## 2011-08-21 NOTE — Progress Notes (Signed)
Interpreter in room and History completed with husband . Understands transfer

## 2011-08-21 NOTE — Progress Notes (Signed)
INITIAL ADULT NUTRITION ASSESSMENT Date: 08/21/2011   Time: 12:49 PM  Reason for Assessment: Consult  ASSESSMENT: Female 71 y.o.  Dx: ST elevation myocardial infarction (STEMI) of inferior wall  Hx:  Past Medical History  Diagnosis Date  . Stroke   . Hypertension   . Brain tumor     evaluated at [facility]  . Aphasia   . Anorexia    Related Meds:  Past Medical History  Diagnosis Date  . Stroke   . Hypertension   . Brain tumor     evaluated at [facility]  . Aphasia   . Anorexia     Ht: 5\' 2"  (157.5 cm)  Wt: 151 lb 3.8 oz (68.6 kg)  Ideal Wt: 50 kg % Ideal Wt: 137%  Usual Wt: --- % Usual Wt: ---  Body mass index is 27.66 kg/(m^2).  Food/Nutrition Related Hx: dysphagia per admission nutrition screen  Labs:  CMP     Component Value Date/Time   NA 138 08/19/2011 0355   K 3.7 08/19/2011 0355   CL 102 08/19/2011 0355   CO2 25 08/19/2011 0355   GLUCOSE 122* 08/19/2011 0355   BUN 19 08/19/2011 0355   CREATININE 0.64 08/19/2011 0355   CALCIUM 9.6 08/19/2011 0355   PROT 8.0 08/18/2011 2154   ALBUMIN 3.8 08/18/2011 2154   AST 115* 08/18/2011 2154   ALT 37* 08/18/2011 2154   ALKPHOS 66 08/18/2011 2154   BILITOT 0.3 08/18/2011 2154   GFRNONAA 90* 08/19/2011 0355   GFRAA >90 08/19/2011 0355    I/O last 3 completed shifts: In: 553 [P.O.:400; I.V.:153] Out: 1660 [Urine:1660] Total I/O In: 240 [P.O.:240] Out: 150 [Urine:150]   Diet Order: Heart Healthy  Supplements/Tube Feeding: N/A  IVF: N/A  Estimated Nutritional Needs:   Kcal: 1700-1900 Protein: 80-90 gm Fluid: 1.7-1.9 L  RD spoke with pt's husband utilizing telephonic interpreting; s/p percutaneous coronary stent intervention 2/16; husband reports pt has had an 8 lb weight loss -- unable to quantify exact time frame; was eating rather poorly PTA; suspect some level of malnutrition; current PO intake variable at 25-100%; also reports pt has trouble swallowing at times; amenable to RD adding supplements during  hospitalization   NUTRITION DIAGNOSIS: -Inadequate oral intake (NI-2.1).  Status: Ongoing  RELATED TO: decreased appetite, s/p PCI-S  AS EVIDENCE BY: PO intake 25-100%  MONITORING/EVALUATION(Goals): Goal: meet >90% of estimated nutrition needs to promote post op healing & recovery Monitor: PO intake, labs, weight, I/O's  EDUCATION NEEDS: -No education needs identified at this time  INTERVENTION:  Recommend speech path consult for swallow evaluation  Ensure Pudding PO TID (170 kcals, 4 gm protein per 4 oz cup)  RD to follow for nutrition care plan  Dietitian #: [phone]  DOCUMENTATION CODES Per approved criteria  -Not Applicable    Alger Memos 08/21/2011, 12:49 PM

## 2011-08-21 NOTE — Evaluation (Signed)
Physical Therapy Evaluation Patient Details Name: [name] MRN: [id number] DOB: 1941/04/30 Today's Date: 08/21/2011  Problem List:  Patient Active Problem List  Diagnoses  . HTN (hypertension)  . Hypercholesterolemia  . ST elevation myocardial infarction (STEMI) of inferior wall  . CAD (coronary artery disease)  . Hydrocephalus  . Acoustic neuroma    Past Medical History:  Past Medical History  Diagnosis Date  . Stroke   . Hypertension   . Brain tumor     evaluated at [facility]  . Aphasia   . Anorexia    Past Surgical History: History reviewed. No pertinent past surgical history.  PT Assessment/Plan/Recommendation PT Assessment Clinical Impression Statement: Pt with STEMI and hx of brain tumor. Pt with severely impaired mobility and cognition. Spouse has been caregiver for pt and states he is not interested in SNF and wishes to continue to  care for pt but would be accepting of assist of an aide. Will trial PT to see if pt can progress with mobility coupled with cognitive impairment. Educated family for benefit of exercising legs as much as possilbe and trying to get pt to participate rather than doing it for her if possible. Family appreciative. PT Recommendation/Assessment: Patient will need skilled PT in the acute care venue PT Problem List: Decreased strength;Decreased balance;Decreased mobility;Decreased activity tolerance;Decreased coordination;Decreased cognition;Decreased safety awareness;Decreased knowledge of use of DME Barriers to Discharge: Inaccessible home environment PT Therapy Diagnosis : Difficulty walking;Generalized weakness;Altered mental status PT Plan PT Frequency: Min 2X/week PT Treatment/Interventions: DME instruction;Functional mobility training;Balance training;Therapeutic exercise;Therapeutic activities;Patient/family education PT Recommendation Follow Up Recommendations: Skilled nursing facility;Spouse does not want SNF and states he plans to continue to  provide care.Hospital bed, ramp, and aide would be most beneficial to maximize pt ability in home and decrease physical burden for caregiver Equipment Recommended: Other (comment) (hospital bed) PT Goals  Acute Rehab PT Goals PT Goal Formulation: With family Pt will go Supine/Side to Sit: with min assist PT Goal: Supine/Side to Sit - Progress: Goal set today Pt will go Sit to Stand: with min assist PT Goal: Sit to Stand - Progress: Goal set today Pt will go Stand to Sit: with min assist PT Goal: Stand to Sit - Progress: Goal set today Pt will Transfer Bed to Chair/Chair to Bed: with min assist PT Transfer Goal: Bed to Chair/Chair to Bed - Progress: Goal set today  PT Evaluation Precautions/Restrictions  Precautions Precautions: Fall Restrictions Weight Bearing Restrictions: No Prior Functioning  Home Living Lives With: Spouse Receives Help From: Family Type of Home: House Home Layout: One level Home Access: Stairs to enter [employer] of Steps: 4, family physically carries pt up the steps Home Adaptive Equipment: Wheelchair - manual;Walker - rolling;Bedside commode/3-in-1 Prior Function Level of Independence: Needs assistance with ADLs;Needs assistance with tranfers Bath: Total Toileting: Total Dressing: Total Grooming: Total Feeding: Total Driving: No Vocation: Unemployed Comments: Per son who provided information spouse does everything for pt who has not ambulated in 1 year after a fall and whose cognition has declined over the last 3 months with waxing and waning of being able to understand commands or recognize family Cognition Cognition Arousal/Alertness: Awake/alert Overall Cognitive Status: History of cognitive impairments History of Cognitive Impairment: Appears at baseline functioning Memory: Appears impaired Orientation Level: Other (Comment) (Pt does not respond to questions) Following Commands: Other (comment) (pt only follows tactile cues coupled  with facilitation) Cognition - Other Comments: Pt with flat affect and relatively blank stare. When physically assisted onto feet or to  scoot to EOB pt will participate.Pt unable to perform with commands only Sensation/Coordination   Extremity Assessment RLE Assessment RLE Assessment: Exceptions to [facility] RLE Strength RLE Overall Strength Comments: grossly 2+/5 pt able to perform Long arc quads with visual and tactile cueing LLE Assessment LLE Assessment: Exceptions to [facility] LLE Strength LLE Overall Strength Comments: grossly 2+/5 pt able to perform Long arc quads with visual and tactile cueing Mobility (including Balance) Bed Mobility Supine to Sit: 6: Modified independent (Device/Increase time) Sitting - Scoot to Edge of Bed: 2: Max assist Sitting - Scoot to [employer] of Bed Details (indicate cue type and reason): Pt able to sit up in bed without assist but required max assist with pad to pivot and scoot to EOB Transfers Sit to Stand: 3: Mod assist;From bed;From chair/3-in-1 Sit to Stand Details (indicate cue type and reason): Pt requires tactile cueing and facilitation at bilateral axilla to initiate standing with assist for anterior weight shift x 4 trials Stand to Sit: 3: Mod assist;To chair/3-in-1 Stand to Sit Details: assist to control descent and position buttocks at surface x 4 trials Stand Pivot Transfers: 3: Mod assist Stand Pivot Transfer Details (indicate cue type and reason): no AD, pt holding onto PT arms with facilitation to advance and rotate pelvis Ambulation/Gait Ambulation/Gait: Yes Ambulation/Gait Assistance: 2: Max assist Ambulation/Gait Assistance Details (indicate cue type and reason): pt required posterior to anterior facilitation to advance feet with max assist for anterior translation and initiation. Total assist to direct RW and pull RW back toward pt. Very short shuffling steps grossly a couple inches each step. Pt maintaining upright without knee buckling Ambulation  Distance (Feet): 12 Feet Assistive device: Rolling walker Gait Pattern: Shuffle Gait velocity: very slow  Posture/Postural Control Posture/Postural Control: No significant limitations Balance Balance Assessed: Yes Static Sitting Balance Static Sitting - Balance Support: Feet supported Static Sitting - Level of Assistance: 5: Stand by assistance Static Sitting - Comment/# of Minutes: 2 Exercise    End of Session PT - End of Session Equipment Utilized During Treatment: Gait belt Activity Tolerance: Patient tolerated treatment well;Other (comment) (Pt limited by cognition) Patient left: in chair;with call bell in reach;with family/visitor present Nurse Communication: Mobility status for transfers;Mobility status for ambulation General Behavior During Session: Flat affect Cognition: Impaired, at baseline Son able to translate Delorse Lek 08/21/2011, 4:05 PM  Toney Sang, PT [phone]

## 2011-08-21 NOTE — Progress Notes (Signed)
  Echocardiogram 2D Echocardiogram has been performed.  [name], Real Cons 08/21/2011, 11:25 AM

## 2011-08-21 NOTE — Progress Notes (Signed)
   CARE MANAGEMENT NOTE 08/21/2011  Patient:  [name], [name]   Account Number:  [id number]  Date Initiated:  08/20/2011  Documentation initiated by:  GRAVES-BIGELOW,Siah Kannan  Subjective/Objective Assessment:   Pt admitted with Epigastric pain. Pt has PMH: of HTN and a recent dx of "brain tumor" (seeing Dr. Deneen Harts at [facility]). 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 Continue dual platelet therapy for now.     Action/Plan:   RN / Sec on floor was able to send release forms and faxed infromation to [facility] to receive records. PT will f/u for d/c disposition. CM will speak to husband about disposition plans.   Anticipated DC Date:  08/27/2011   Anticipated DC Plan:  SKILLED NURSING FACILITY      DC Planning Services  CM consult      Choice offered to / List presented to:             Status of service:  In process, will continue to follow Medicare Important Message given?   (If response is "NO", the following Medicare IM given date fields will be blank) Date Medicare IM given:   Date Additional Medicare IM given:    Discharge Disposition:    Per UR Regulation:    Comments:  08-21-11 1504 Tomi Bamberger, RN,BSN [phone] CM did speak to PT and she will evaluate today and make recommendations for disposition. CM will continue to monitor for disposition needs.  08-20-11 1509 Gerome Apley [phone] Records obtained from [facility]. Unit secretary placed records in shadow chart. Thanks  08-20-11 [address], [place] [phone] CM did speak to RN Sam and she stated that per family pt had been loosing weight at home- wc bound and incontinent at times. CM called for a [place] interpreter to assist with disposition needs. Pt has been in a wheelchair for 1 year. She has dme rw and bsc. Pt has 4 steps to get into home and no ramp per husband. He bathes her in bed and prepares meals. Pt has cheildren that assists with care also.  CM did give pt an advanced directive brochure and he will take it home and have his children read it to him. CM was able to fax release of information forms to [facility] with MD signature. Hopefully records to be sent today. If PT recommends HH services- pt will need [facility] RN also. Husband stated that [facility] will be fine if that is the recommendation. We did discuss the possibility of SNF. Will continue to f/u for d/c disposition.

## 2011-08-22 DIAGNOSIS — I10 Essential (primary) hypertension: Secondary | ICD-10-CM

## 2011-08-22 DIAGNOSIS — I2510 Atherosclerotic heart disease of native coronary artery without angina pectoris: Secondary | ICD-10-CM

## 2011-08-22 DIAGNOSIS — I2119 ST elevation (STEMI) myocardial infarction involving other coronary artery of inferior wall: Principal | ICD-10-CM

## 2011-08-22 NOTE — Progress Notes (Signed)
   CARE MANAGEMENT NOTE 08/22/2011  Patient:  [name], [name]   Account Number:  [id number]  Date Initiated:  08/20/2011  Documentation initiated by:  GRAVES-BIGELOW,Chantille Navarrete  Subjective/Objective Assessment:   Pt admitted with Epigastric pain. Pt has PMH: of HTN and a recent dx of "brain tumor" (seeing Dr. Deneen Harts at [facility]). 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 Continue dual platelet therapy for now.     Action/Plan:   RN / Sec on floor was able to send release forms and faxed infromation to [facility] to receive records. PT will f/u for d/c disposition. CM will speak to husband about disposition plans.   Anticipated DC Date:  08/27/2011   Anticipated DC Plan:  SKILLED NURSING FACILITY      DC Planning Services  CM consult      PAC Choice  DURABLE MEDICAL EQUIPMENT  HOME HEALTH   Choice offered to / List presented to:  C-1 Patient   DME arranged  HOSPITAL BED      DME agency  [facility]     [facility] arranged  HH-1 RN  HH-10 DISEASE MANAGEMENT  HH-2 PT  HH-4 NURSE'S AIDE  HH-6 SOCIAL WORKER      HH agency  [facility]   Status of service:  In process, will continue to follow Medicare Important Message given?   (If response is "NO", the following Medicare IM given date fields will be blank) Date Medicare IM given:   Date Additional Medicare IM given:    Discharge Disposition:  HOME W HOME HEALTH SERVICES  Per UR Regulation:    Comments:  08-22-11 [address], Rn,BSN [phone] CM did make a referral with [facility] for services of listed above. Will need to get orders. Will f/u.  08-22-11 1411 Tomi Bamberger, RN,BSN [phone] CM did speak to Pt's son Vo and he and his dad would like to take pt home and would like [facility] services of RN, Aide and PT. Per PT dme needed would be Hospital bed.Pt will need to qualify for hospital bed.  Per son they would like to use [facility] for services. CM will need orders for  [facility] services and did speak to [employer].  08-21-11 1504 Tomi Bamberger, RN,BSN [phone] CM did speak to PT and she will evaluate today and make recommendations for disposition. CM will continue to monitor for disposition needs.  08-20-11 1509 Gerome Apley [phone] Records obtained from [facility]. Unit secretary placed records in shadow chart. Thanks  08-20-11 [address], [place] [phone] CM did speak to RN Sam and she stated that per family pt had been loosing weight at home- wc bound and incontinent at times. CM called for a [place] interpreter to assist with disposition needs. Pt has been in a wheelchair for 1 year. She has dme rw and bsc. Pt has 4 steps to get into home and no ramp per husband. He bathes her in bed and prepares meals. Pt has cheildren that assists with care also. CM did give pt an advanced directive brochure and he will take it home and have his children read it to him. CM was able to fax release of information forms to [facility] with MD signature. Hopefully records to be sent today. If PT recommends HH services- pt will need [facility] RN also. Husband stated that [facility] will be fine if that is the recommendation. We did discuss the possibility of SNF. Will continue to f/u for d/c disposition.

## 2011-08-22 NOTE — Progress Notes (Signed)
   TELEMETRY: Reviewed telemetry pt in NSR: Filed Vitals:   08/21/11 1425 08/21/11 2203 08/22/11 0528 08/22/11 1330  BP: 115/70 124/80 130/68 102/60  Pulse: 65 65 65 67  Temp: 97.5 F (36.4 C) 97.6 F (36.4 C) 98.4 F (36.9 C) 97.5 F (36.4 C)  TempSrc: Oral Oral  Oral  Resp: 18 18 18 18   Height:      Weight:   70.7 kg (155 lb 13.8 oz)   SpO2: 95% 97% 97% 95%    Intake/Output Summary (Last 24 hours) at 08/22/11 1410 Last data filed at 08/22/11 1300  Gross per 24 hour  Intake    600 ml  Output   1103 ml  Net   -503 ml    SUBJECTIVE  Doesn't speak english. Uncommunicative even with family. No new events overnight. Family present. They report patient comfortable.  LABS:  PHYSICAL EXAM General: Well developed, well nourished, in no acute distress. Head: Normocephalic, atraumatic, sclera non-icteric, no xanthomas, nares are without discharge. Neck: Negative for carotid bruits. JVD not elevated. Lungs: Clear bilaterally to auscultation without wheezes, rales, or rhonchi. Breathing is unlabored. Heart: RRR S1 S2 without murmurs, rubs, or gallops.  Abdomen: Soft, non-tender, non-distended with normoactive bowel sounds. No hepatomegaly. No rebound/guarding. No obvious abdominal masses. Msk:  Unable to follow commands. Extremities: No clubbing, cyanosis or edema.  Distal pedal pulses are 2+ and equal bilaterally. Neuro: Alert. Eyes open. Moves all extremities spontaneously.   ASSESSMENT AND PLAN: 1. Inferior STEMI s/p BMS to RCA. Patient has severe 3 vessel CAD. EF is well preserved. 2. Progressive neurologic decline. Records from [facility] reviewed. Patient has a left acoustic neuroma. Also has hydrocephalus although lumbar drain trial was negative.  3. Dyslipidemia 4. Htn  Plan: I spoke with her neurosurgeon Dr. Cherrie Distance. Zachery Conch at [facility] yesterday. Updated him on events. Patent's evaluation there demonstrated normal pressure hydrocephalus and an acoustic neuroma. He did not feel  her neurologic decline was related to these issues. She was admitted to the hospital in November and had a lumbar drain placed for 3 days without improvement. There was consideration for a shunt but given the lack of response to lumbar drain he did not think this would help. Now with her recent cardiac events she is not a candidate for surgical procedure. I discussed all this with son and daughter who speak some [place]. I offered NHP. They desire to take patient home with [facility]. They understand that she is not likely to improve. If they are not able to adequately care for her at home then NHP could be done later. We will DC foley today. Plan DC to home tomorrow.   Principal Problem:  *ST elevation myocardial infarction (STEMI) of inferior wall Active Problems:  HTN (hypertension)  Hypercholesterolemia  CAD (coronary artery disease)  Hydrocephalus  Acoustic neuroma    Signed, Masai Kidd Sw[place] MD, [facility] 08/22/2011 2:10 PM

## 2011-08-22 NOTE — Progress Notes (Signed)
   CARE MANAGEMENT NOTE 08/22/2011  Patient:  [name], [name]   Account Number:  [id number]  Date Initiated:  08/20/2011  Documentation initiated by:  GRAVES-BIGELOW,Bharat Antillon  Subjective/Objective Assessment:   Pt admitted with Epigastric pain. Pt has PMH: of HTN and a recent dx of "brain tumor" (seeing Dr. Deneen Harts at [facility]). 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 Continue dual platelet therapy for now.     Action/Plan:   RN / Sec on floor was able to send release forms and faxed infromation to [facility] to receive records. PT will f/u for d/c disposition. CM will speak to husband about disposition plans.   Anticipated DC Date:  08/27/2011   Anticipated DC Plan:  SKILLED NURSING FACILITY      DC Planning Services  CM consult      Choice offered to / List presented to:             Status of service:  In process, will continue to follow Medicare Important Message given?   (If response is "NO", the following Medicare IM given date fields will be blank) Date Medicare IM given:   Date Additional Medicare IM given:    Discharge Disposition:    Per UR Regulation:    Comments:  08-22-11 1411 Tomi Bamberger, RN,BSN [phone] CM did speak to Pt's son Vo and he and his dad would like to take pt home and would like [facility] services of RN, Aide and PT. Per PT dme needed would be Hospital bed.Pt will need to qualify for hospital bed.  Per son they would like to use [facility] for services. CM will need orders for [facility] services and did speak to [employer].  08-21-11 1504 Tomi Bamberger, RN,BSN [phone] CM did speak to PT and she will evaluate today and make recommendations for disposition. CM will continue to monitor for disposition needs.  08-20-11 1509 Gerome Apley [phone] Records obtained from [facility]. Unit secretary placed records in shadow chart. Thanks  08-20-11 [address] [place] [phone] CM did speak to RN Sam and she  stated that per family pt had been loosing weight at home- wc bound and incontinent at times. CM called for a [place] interpreter to assist with disposition needs. Pt has been in a wheelchair for 1 year. She has dme rw and bsc. Pt has 4 steps to get into home and no ramp per husband. He bathes her in bed and prepares meals. Pt has cheildren that assists with care also. CM did give pt an advanced directive brochure and he will take it home and have his children read it to him. CM was able to fax release of information forms to [facility] with MD signature. Hopefully records to be sent today. If PT recommends HH services- pt will need [facility] RN also. Husband stated that [facility] will be fine if that is the recommendation. We did discuss the possibility of SNF. Will continue to f/u for d/c disposition.

## 2011-08-22 NOTE — Discharge Instructions (Addendum)
Home Health  Services will be arranged with [facility]. [phone]. 1. Registered Nurse 2. Physical Therapy 3. Aide 4. Child psychotherapist.   Durable medical equipment will be delivered to home-hospital bed.   Activity: Increase activity slowly as tolerated. You may shower, but no soaking baths for 2 days.    You May Return to Work: ---  Wound Care: You may wash cath site gently with soap and water. Keep cath site clean and dry. If you notice pain, swelling, bleeding or pus at your cath site, please call [phone].

## 2011-08-23 MED ORDER — METOPROLOL TARTRATE 25 MG PO TABS: 25.0000 mg | ORAL_TABLET | Freq: Two times a day (BID) | ORAL | Status: DC

## 2011-08-23 MED ORDER — CLOPIDOGREL BISULFATE 75 MG PO TABS: 75.0000 mg | ORAL_TABLET | Freq: Every day | ORAL | Status: DC

## 2011-08-23 MED ORDER — NITROGLYCERIN 0.4 MG SL SUBL: 0.4000 mg | SUBLINGUAL_TABLET | SUBLINGUAL | Status: DC | PRN

## 2011-08-23 MED ORDER — ASPIRIN 81 MG PO TBEC: 81.0000 mg | DELAYED_RELEASE_TABLET | Freq: Every day | ORAL | Status: DC

## 2011-08-23 MED ORDER — ROSUVASTATIN CALCIUM 40 MG PO TABS: 40.0000 mg | ORAL_TABLET | Freq: Every day | ORAL | Status: DC

## 2011-08-23 NOTE — Progress Notes (Signed)
   TELEMETRY: Reviewed telemetry pt in NSR: Filed Vitals:   08/22/11 0528 08/22/11 1330 08/22/11 2213 08/23/11 0459  BP: 130/68 102/60 120/74 149/73  Pulse: 65 67 64 74  Temp: 98.4 F (36.9 C) 97.5 F (36.4 C) 98.7 F (37.1 C) 97.5 F (36.4 C)  TempSrc:  Oral Oral Oral  Resp: 18 18 20 19   Height:      Weight: 70.7 kg (155 lb 13.8 oz)     SpO2: 97% 95% 97% 97%    Intake/Output Summary (Last 24 hours) at 08/23/11 9604 Last data filed at 08/22/11 2100  Gross per 24 hour  Intake    360 ml  Output    803 ml  Net   -443 ml    SUBJECTIVE  Doesn't speak english. Uncommunicative even with family. No new events overnight. Need to confirm voiding urine now that foley out  LABS:  PHYSICAL EXAM General: Well developed, well nourished, in no acute distress. Head: Normocephalic, atraumatic, sclera non-icteric, no xanthomas, nares are without discharge. Neck: Negative for carotid bruits. JVD not elevated. Lungs: Clear bilaterally to auscultation without wheezes, rales, or rhonchi. Breathing is unlabored. Heart: RRR S1 S2 without murmurs, rubs, or gallops.  Abdomen: Soft, non-tender, non-distended with normoactive bowel sounds. No hepatomegaly. No rebound/guarding. Bladder not distended Msk:  Unable to follow commands. Extremities: No clubbing, cyanosis or edema.  Distal pedal pulses are 2+ and equal bilaterally. Neuro: Alert. Eyes open. Moves all extremities spontaneously.   ASSESSMENT AND PLAN: 1. Inferior STEMI s/p BMS to RCA. Patient has severe 3 vessel CAD. EF is well preserved. 2. Progressive neurologic decline. Records from [facility] reviewed. Patient has a left acoustic neuroma. Also has hydrocephalus although lumbar drain trial was negative. See discussion from yesterday 3. Dyslipidemia 4. Htn  Plan: DC today with HH services.   Principal Problem:  *ST elevation myocardial infarction (STEMI) of inferior wall Active Problems:  HTN (hypertension)  Hypercholesterolemia  CAD  (coronary artery disease)  Hydrocephalus  Acoustic neuroma    Signed, Treshun Wold [place] MD, [facility] 08/23/2011 8:03 AM

## 2011-08-23 NOTE — Discharge Summary (Signed)
Patient seen and examined and history reviewed. Agree with above findings and plan.See rounding note from earlier today.   Truman Aceituno JordanMD 08/23/2011 10:58 AM    

## 2011-08-23 NOTE — Progress Notes (Signed)
CARE MANAGEMENT NOTE 08/23/2011  Patient:  [name], [name]   Account Number:  [id number]  Date Initiated:  08/20/2011  Documentation initiated by:  GRAVES-BIGELOW,Keidy Thurgood  Subjective/Objective Assessment:   Pt admitted with Epigastric pain. Pt has PMH: of HTN and a recent dx of "brain tumor" (seeing Dr. Deneen Harts at [facility]). 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 Continue dual platelet therapy for now.     Action/Plan:   RN / Sec on floor was able to send release forms and faxed infromation to [facility] to receive records. PT will f/u for d/c disposition. CM will speak to husband about disposition plans.   Anticipated DC Date:  08/27/2011   Anticipated DC Plan:  SKILLED NURSING FACILITY      DC Planning Services  CM consult      PAC Choice  DURABLE MEDICAL EQUIPMENT  HOME HEALTH   Choice offered to / List presented to:  C-1 Patient   DME arranged  HOSPITAL BED      DME agency  [facility]     [facility] arranged  HH-1 RN  HH-10 DISEASE MANAGEMENT  HH-2 PT  HH-4 NURSE'S AIDE  HH-6 SOCIAL WORKER      HH agency  [facility]   Status of service:  In process, will continue to follow Medicare Important Message given?   (If response is "NO", the following Medicare IM given date fields will be blank) Date Medicare IM given:   Date Additional Medicare IM given:    Discharge Disposition:  HOME W HOME HEALTH SERVICES  Per UR Regulation:    Comments:  08-23-11 0946 Tomi Bamberger, RN,BSN [phone] CM did receive orders for services and dme. referrals made and bed will be delivered today to home. HH services SOC to begin within 24-48 hours post d/c.   08-22-11 [address] Rn,BSN [phone] CM did make a referral with [facility] for services of listed above. Will need to get orders. Will f/u.  08-22-11 1411 Tomi Bamberger, RN,BSN [phone] CM did speak to Pt's son Vo and he and his dad would like to  take pt home and would like [facility] services of RN, Aide and PT. Per PT dme needed would be Hospital bed.Pt will need to qualify for hospital bed.  Per son they would like to use [facility] for services. CM will need orders for [facility] services and did speak to [employer].  08-21-11 1504 Tomi Bamberger, RN,BSN [phone] CM did speak to PT and she will evaluate today and make recommendations for disposition. CM will continue to monitor for disposition needs.  08-20-11 1509 Gerome Apley [phone] Records obtained from [facility]. Unit secretary placed records in shadow chart. Thanks  08-20-11 [address] [place] [phone] CM did speak to RN Sam and she stated that per family pt had been loosing weight at home- wc bound and incontinent at times. CM called for a [place] interpreter to assist with disposition needs. Pt has been in a wheelchair for 1 year. She has dme rw and bsc. Pt has 4 steps to get into home and no ramp per husband. He bathes her in bed and prepares meals. Pt has cheildren that assists with care also. CM did give pt an advanced directive brochure and he will take it home and have his children read it to him. CM was able to fax release of information forms to [facility] with MD signature. Hopefully records to be sent today. If PT recommends [facility] services- pt will need  HH RN also. Husband stated that [facility] will be fine if that is the recommendation. We did discuss the possibility of SNF. Will continue to f/u for d/c disposition.

## 2011-08-23 NOTE — Progress Notes (Signed)
Pt. Foley Discontinued at 2240 on 08/22/11, pt has not voided yet. Bladder scan was done, pt had 307 cc in bladder. Interpretor was called and spoke with husband to see if pt felt any pressure or felt like bladder was full. Pt told husband no and husband told RN no. Will reassess per protocol and continue to monitor.

## 2011-08-23 NOTE — Discharge Summary (Signed)
Discharge Summary   Patient ID: [name],  MRN: [id number], DOB/AGE: [birth year] 71 y.o.  Admit date: 08/18/2011 Discharge date: 08/23/2011  Discharge Diagnoses Principal Problem:  *ST elevation myocardial infarction (STEMI) of inferior wall Active Problems:  HTN (hypertension)  Hypercholesterolemia  CAD (coronary artery disease)  Hydrocephalus  Acoustic neuroma   Allergies No Known Allergies  Procedures  Cardiac Catheterization  Hemodynamics:  AO 166/84 with a mean of 117 mm Hg  LV 166/22 mmHg  Coronary angiography:  Coronary dominance: right  Left mainstem: There is mild diffuse narrowing of the left main up to 20%.  Left anterior descending (LAD): There is a 90% stenosis in the proximal LAD. The proximal vessel is diffusely diseased. The mid LAD is very tortuous and diffusely diseased of moderate degree up to 70%. The first diagonal also has a moderate stenosis proximally up to 80%.  Left circumflex (LCx): The left circumflex gives rise to a very small first marginal branch and then terminates in a larger marginal branch. There is diffuse disease in the mid vessel up to 70%.  Right coronary artery (RCA): The right coronary has an anterior takeoff. It is occluded proximally.  Left ventriculography: Performed at the end of the procedure Left ventricular systolic function is overall normal, LVEF is estimated at 55-65%, there is mild inferior wall hypokinesis, there is no significant mitral regurgitation  PCI Procedure Note: Following the diagnostic procedure, the decision was made to proceed with PCI. Weight-based bivalirudin was given for anticoagulation. Once a therapeutic ACT was achieved, a 6 French left Amplatz 0.75 guide catheter was inserted. A pro-water coronary guidewire was used to cross the lesion. With wire crossing there was reperfusion with a high-grade stenosis in the mid right coronary with significant thrombus burden. Thrombectomy extraction was performed with an  expressway catheter. The lesion was predilated with a 2.5 mm balloon. The lesion was then stented with a 3.0 x 22 mm integrity bare-metal stent. The stent was postdilated with a 3.25 mm noncompliant balloon. Following PCI, there was less than 10 % residual stenosis and TIMI-3 flow. The distal right coronary was occluded after the first posterior lateral branch. The PDA was severely and diffusely diseased in the proximal and mid vessel up to 95%. Femoral hemostasis was achieved with an Angio-Seal device. The patient tolerated the PCI procedure well. There were no immediate procedural complications. The patient was transferred to the intensive care unit for further monitoring.  PCI Data:  Vessel - RCA/Segment - mid  Percent Stenosis (pre) 100%  TIMI-flow 0  Stent 3.0 x 22 mm integrity stent  Percent Stenosis (post) less than 10%  TIMI-flow (post) 3  Final Conclusions:  1. 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  2. Good left ventricular systolic function.  3. Successful stenting of the mid right coronary with a bare-metal stent.  Recommendations: Patient will be treated initially with dual antiplatelet therapy. I think she will need coronary bypass surgery for definitive revascularization.  2D echocardiogram   Study Conclusions  Left ventricle: The cavity size was normal. Wall thickness was increased in a pattern of moderate LVH. Systolic function was normal. The estimated ejection fraction was in the range of 55% to 60%. There is severe hypokinesis of the entireinferior myocardium. Doppler parameters are consistent with abnormal left ventricular relaxation (grade 1 diastolic dysfunction). Transthoracic echocardiography. M-mode, complete 2D, spectral Doppler, and color Doppler. Height: Height: 157.5cm. Height: 62in. Weight: Weight: 68.6kg. Weight: 150.9lb. Body mass index: BMI: 27.7kg/m^2. Body surface area: BSA: 1.19m^2.  Blood pressure: 110/60. Patient status: Inpatient.  Location: ICU/CCU ------------------------------------------------------------ ------------------------------------------------------------ Left ventricle: The cavity size was normal. Wall thickness was increased in a pattern of moderate LVH. Systolic function was normal. The estimated ejection fraction was in the range of 55% to 60%. Regional wall motion abnormalities: There is severe hypokinesis of the entireinferior myocardium. Doppler parameters are consistent with abnormal left ventricular relaxation (grade 1 diastolic dysfunction). ------------------------------------------------------------ Aortic valve: Mildly thickened leaflets. Doppler: No significant regurgitation. ------------------------------------------------------------ Aorta: Aortic root: The aortic root was normal in size. Ascending aorta: The ascending aorta was normal in size. ------------------------------------------------------------ Mitral valve: Doppler: No significant regurgitation. Peak gradient: 2mm Hg (D). ------------------------------------------------------------ Left atrium: The atrium was normal in size. ------------------------------------------------------------ Right ventricle: The cavity size was normal. Systolic function was normal. ------------------------------------------------------------ Pulmonic valve: Poorly visualized. ------------------------------------------------------------ Tricuspid valve: Doppler: No significant regurgitation. ------------------------------------------------------------ Right atrium: The atrium was normal in size. ------------------------------------------------------------ Pericardium: There was no pericardial effusion. ------------------------------------------------------------ 2D measurements Normal Doppler Normal Left ventricle measurements LVID ED, 39 mm 43-52 Left ventricle chord, Ea, lat 5.26 cm/ ------- PLAX ann, tiss s LVID ES, 27.8 mm 23-38 DP chord, E/Ea,  lat 14.16 ------- PLAX ann, tiss FS, chord, 29 % >29 DP PLAX Ea, med 4.29 cm/ ------- LVPW, ED 14.2 mm ------ ann, tiss s IVS/LVPW 1.16 <1.3 DP ratio, ED E/Ea, med 17.37 ------- Ventricular septum ann, tiss IVS, ED 16.5 mm ------ DP Aorta Mitral valve Root diam, 30 mm ------ Peak E vel 74.5 cm/ ------- ED s Left atrium Peak A vel 103 cm/ ------- AP dim 38 mm ------ s AP dim 2.17 cm/m^2 <2.2 Deceleratio 243 ms 150-230 index n time Peak 2 mm ------- gradient, D Hg Peak E/A 0.7 ------- ratio Right ventricle Sa vel, lat 15.1 cm/ ------- ann, tiss s DP  History of Present Illness  Ms. Shadden is a 71yo Asian female, non-English-speaking, with PMHx significant for acoustic neuroma, normal pressure hydrocephalus s/p failed improvement with lumbar drain placement (managed by [facility] neurology), HTN and HL who presented to [facility] ED on 02/16 with epigastric pain and evidence of STEMI. She was transported to the cardiac cath lab for emergent PCI.   Her HPI was supplemented by her son who speaks [place]. She has been in her usual state of health until 11am the day of admission when she complained of epigastric pain. EMS was called, and a 12-lead EKG revealed inferior ST elevations (II, III, aVF) of ~ 3mm. She received NTG SL x 3, ASA 81 x 4 en route to the ED. Her pain persisted on arrival. She was loaded with Plavix (600mg ) and started on Heparin drip. BP in the ED: 154/90, HR 58. NAD or signs of HF on exam. Clinical condition was discussed with her son, and he signed the consent form.    Hospital Course   She was started on NTG gtt on transport to the cath lab. She underwent cardiac catheterization eliciting the above details including 100% mid RCA stenosis, LVEF 55-65% and severe diffuse CAD (90% prox LAD, 70% mid LAD, 80%, prox D1, 70% mid LCx lesions). The decision was made to stent the culprit lesions (mid RCA stenosis), which was successfully achieved with a BMS. This a achieved a 10%  post-intervention stenosis. The recommendation was made to pursue DAPT- ASA/Plavix, and evaluate CABG for definitive revascularization. She tolerated the procedure well without immediate post-procedure complications.   Her symptoms improved the following day. TCTS was consulted, and agreed CABG would provide definitive revascularization, however, this patient suffers  from some type of brain tumor that has left her with severe neurologic functional deficit that would preclude the use of coronary artery bypass grafting as an appropriate means of therapy. According to the patient's daughter she had been going downhill quite traumatically in recent months. She reportedly can no longer walk, she no longer recognizes her family members, she is eating poorly and losing weight, and she has apparently been aphasic. For this reason, surgical revascularization was not recommended. The decision was made to continue long-term medical therapy and consideration of palliative treatment. CM was consulted for [facility] needs including RN, PT, Aide and SW for SNF placement in the future.   A hospital bed was also requested and ordered. The patient's neurological and functional status, as mentioned above, have been steadily declining. The patient has an acoustic neuroma and normal pressure hydrocephalus, and the patient has become unable to ambulate. This requires her upper body and extremities to be positioned in ways not feasible with a normal bed. The head must be elevated at least 30 degrees or more to ensure proper airway management and to feed easily. If condition worsens, patient may not be able to maintain her own airway and/or may be a high aspiration risk.   Records were obtained from [facility] and Dr. Swaziland spoke with a neurologist there. He was informed of her circumstances this admission. Dr. Swaziland learned of her course at [facility] with failing improvement in lumbar drain placement. The decision was definitively made not to pursue  further intervention given her decline. This was discussed with the family, and they understood that her condition is not likely to improve. They did request that she be discharged home with [facility] services (mentioned above) and consideration of SNF in the future.   A 2D echo was performed revealing the above details including LVEF 55-60%, moderate LVH, severe hypokinesis of the inferior myocardium and grade 1 diastolic dysfunction.   The patient remained in NSR and condition further stabilized. Today, she is stable, at baseline and will be discharged home. She will be continued on initiated cardiac meds- ASA/Plavix, up-titrated Lopressor, Crestor and NTG SL PRN. She will resume her outpatient meds. She will follow-up in our office in 14 days. She will also need to follow-up with [facility] neurology for this hospitalization. This information has been clearly outlined for her on the discharge sheet, including post-PCI instructions.   Discharge Vitals:  Blood pressure 149/73, pulse 74, temperature 97.5 F (36.4 C), temperature source Oral, resp. rate 19, height 5\' 2"  (1.575 m), weight 70.7 kg (155 lb 13.8 oz), SpO2 97.00%.   Labs:  Lab 08/19/11 0355 08/18/11 2154 08/18/11 1745  NA 138 137 142  K 3.7 4.3 4.1  CL 102 101 104  CO2 25 23 28   BUN 19 20 26*  CREATININE 0.64 0.66 0.81  CALCIUM 9.6 9.5 9.4  PROT -- 8.0 --  BILITOT -- 0.3 --  ALKPHOS -- 66 --  ALT -- 37* --  AST -- 115* --  AMYLASE -- -- --  LIPASE -- -- --  GLUCOSE 122* 138* 142*    Disposition:  Discharge Orders    Future Appointments: Provider: Department: Dept Phone: Center:   09/06/2011 9:15 AM Rosalio Macadamia, NP [facility] [phone] None     Future Orders Please Complete By Expires   DME Hospital bed        Follow-up Information    Follow up with Norma Fredrickson, NP on 09/06/2011. (At 9:15 AM)    Contact  information:   [address], [address] [place] [address] [phone]       Schedule an  appointment as soon as possible for a visit to follow up. (With [facility] for follow-up after this hospitalization. )          Discharge Medications:  Medication List  As of 08/23/2011  9:41 AM   START taking these medications         aspirin 81 MG EC tablet   Take 1 tablet (81 mg total) by mouth daily.      clopidogrel 75 MG tablet   Commonly known as: PLAVIX   Take 1 tablet (75 mg total) by mouth daily with breakfast.      nitroGLYCERIN 0.4 MG SL tablet   Commonly known as: NITROSTAT   Place 1 tablet (0.4 mg total) under the tongue every 5 (five) minutes x 3 doses as needed for chest pain.      rosuvastatin 40 MG tablet   Commonly known as: CRESTOR   Take 1 tablet (40 mg total) by mouth daily.         CHANGE how you take these medications         metoprolol tartrate 25 MG tablet   Commonly known as: LOPRESSOR   Take 1 tablet (25 mg total) by mouth 2 (two) times daily.   What changed: how often to take the med         CONTINUE taking these medications         docusate sodium 100 MG capsule   Commonly known as: COLACE      polyethylene glycol packet   Commonly known as: MIRALAX / GLYCOLAX      traMADol 50 MG tablet   Commonly known as: ULTRAM          Where to get your medications    These are the prescriptions that you need to pick up. We sent them to a specific pharmacy, so you will need to go there to get them.   [id number] - [place], [place] - [address]    [address] [place] [address]    Phone: [phone]        clopidogrel 75 MG tablet   metoprolol tartrate 25 MG tablet   nitroGLYCERIN 0.4 MG SL tablet   rosuvastatin 40 MG tablet         Information on where to get these meds is not yet available. Ask your nurse or doctor.         aspirin 81 MG EC tablet             Outstanding Labs/Studies: None  Duration of Discharge Encounter: 40 minutes including physician time.  Signed, R. Hurman Horn,  PA-C 08/23/2011, 9:41 AM

## 2011-08-30 ENCOUNTER — Telehealth: Payer: Self-pay | Admitting: Cardiology

## 2011-08-30 NOTE — Telephone Encounter (Signed)
Talked with Barbara Cower. He is already in the home doing PT. He is  requesting an order for speech therapy also. Barbara Cower is aware that I am forwarding  this to Dr Swaziland for review and recommendations.

## 2011-08-30 NOTE — Telephone Encounter (Signed)
Barbara Cower- PT from [facility] [phone]   Please return call to Physical therapist from [facility], he needs a  Verbal order for speech therapy.

## 2011-08-30 NOTE — Telephone Encounter (Signed)
OK for speech therapy evaluation. Felder Lebeda Swaziland MD, [facility]

## 2011-08-31 NOTE — Telephone Encounter (Signed)
Left message on [facility]'s [facility] personal voice mail, ok with Dr.Jordan for patient to have speech therapy evaluation.

## 2011-09-04 ENCOUNTER — Telehealth: Payer: Self-pay | Admitting: Cardiology

## 2011-09-04 NOTE — Telephone Encounter (Signed)
Amy speech therapist called stating she was going to follow patient every week for the next 3 weeks.

## 2011-09-04 NOTE — Telephone Encounter (Signed)
New msg [facility] from [facility] wants speech therapy orders please call back

## 2011-09-06 ENCOUNTER — Ambulatory Visit (INDEPENDENT_AMBULATORY_CARE_PROVIDER_SITE_OTHER): Payer: Medicare Other | Admitting: Nurse Practitioner

## 2011-09-06 ENCOUNTER — Encounter: Payer: Self-pay | Admitting: Nurse Practitioner

## 2011-09-06 VITALS — BP 118/70 | HR 62 | Ht 61.0 in | Wt 146.0 lb

## 2011-09-06 DIAGNOSIS — I213 ST elevation (STEMI) myocardial infarction of unspecified site: Secondary | ICD-10-CM

## 2011-09-06 DIAGNOSIS — G911 Obstructive hydrocephalus: Secondary | ICD-10-CM

## 2011-09-06 DIAGNOSIS — I219 Acute myocardial infarction, unspecified: Secondary | ICD-10-CM

## 2011-09-06 DIAGNOSIS — I2119 ST elevation (STEMI) myocardial infarction involving other coronary artery of inferior wall: Secondary | ICD-10-CM

## 2011-09-06 DIAGNOSIS — E7800 Pure hypercholesterolemia, unspecified: Secondary | ICD-10-CM

## 2011-09-06 DIAGNOSIS — I10 Essential (primary) hypertension: Secondary | ICD-10-CM

## 2011-09-06 DIAGNOSIS — G919 Hydrocephalus, unspecified: Secondary | ICD-10-CM

## 2011-09-06 LAB — BASIC METABOLIC PANEL
BUN: 18 mg/dL (ref 6–23)
CO2: 26 mEq/L (ref 19–32)
Calcium: 9.2 mg/dL (ref 8.4–10.5)
Chloride: 106 mEq/L (ref 96–112)
Creatinine, Ser: 0.7 mg/dL (ref 0.4–1.2)
GFR: 82.74 mL/min (ref >60.00)
Glucose, Bld: 117 mg/dL — ABNORMAL HIGH (ref 70–99)
Potassium: 4.1 mEq/L (ref 3.5–5.1)
Sodium: 140 mEq/L (ref 135–145)

## 2011-09-06 LAB — CBC WITH DIFFERENTIAL/PLATELET
Basophils Absolute: 0 10*3/uL (ref 0.0–0.1)
Basophils Relative: 0.6 % (ref 0.0–3.0)
Eosinophils Absolute: 0.5 10*3/uL (ref 0.0–0.7)
Eosinophils Relative: 7.5 % — ABNORMAL HIGH (ref 0.0–5.0)
HCT: 36.3 % (ref 36.0–46.0)
Hemoglobin: 12.1 g/dL (ref 12.0–15.0)
Lymphocytes Relative: 20.7 % (ref 12.0–46.0)
Lymphs Abs: 1.4 10*3/uL (ref 0.7–4.0)
MCHC: 33.4 g/dL (ref 30.0–36.0)
MCV: 93.7 fl (ref 78.0–100.0)
Monocytes Absolute: 0.4 10*3/uL (ref 0.1–1.0)
Monocytes Relative: 5.4 % (ref 3.0–12.0)
Neutro Abs: 4.6 10*3/uL (ref 1.4–7.7)
Neutrophils Relative %: 65.8 % (ref 43.0–77.0)
Platelets: 234 10*3/uL (ref 150.0–400.0)
RBC: 3.87 Mil/uL (ref 3.87–5.11)
RDW: 13.1 % (ref 11.5–14.6)
WBC: 6.9 10*3/uL (ref 4.5–10.5)

## 2011-09-06 NOTE — Assessment & Plan Note (Signed)
Will recheck fasting labs on return.

## 2011-09-06 NOTE — Assessment & Plan Note (Signed)
She apparently failed improvement with lumbar drain. She is basically aphasic and cannot walk. She has not followed up with neurology at [facility]. Son will schedule her a follow up visit.

## 2011-09-06 NOTE — Assessment & Plan Note (Signed)
Blood pressure looks ok.

## 2011-09-06 NOTE — Patient Instructions (Signed)
We are going to check some labs today.  Stay on the same medicines.  We will see her back in about 6 weeks for fasting labs and a visit.  Call the [facility] office at [phone] if you have any questions, problems or concerns.

## 2011-09-06 NOTE — Progress Notes (Signed)
[name] Date of Birth: 03/31/1941 Medical Record #[id number]  History of Present Illness: [name] is seen today for a post hospital visit. She is seen for Dr. Swaziland. She is an unfortunate 71 year old Asian, non-English speaking female who has had recent inferior STEMI. She had emergent cath showing severe diffuse CAD. Culprit lesion in the RCA was stented with a BMS. She was felt to need CABG in order to be fully revascularized but due to her poor neurologic status, this was deferred and she is managed medically. She was treated with Lopressor, Crestor and Plavix. She has a normal EF at 55 to 60% with moderate LVH, severe hypokinesis of the inferior wall and grade 1 diastolic dysfunction. She has had a normal pressure hydrocephalus and failed improvement with lumbar drain placement. She has had a continued decline physically and can no longer walk, eating poorly and losing weight and apparently has been aphasic.   She comes in today. She is here with her son and with an interpreter. Her son reports that she is doing ok. Seems to be back to her baseline. Speaks very little. Not walking. No chest pain reported. Not short of breath. Seems to be tolerating the medicines. Has not been back to [facility] to follow up from her neurologic standpoint. No problems with her groin reported.   Current Outpatient Prescriptions on File Prior to Visit  Medication Sig Dispense Refill  . aspirin EC 81 MG EC tablet Take 1 tablet (81 mg total) by mouth daily.      . clopidogrel (PLAVIX) 75 MG tablet Take 1 tablet (75 mg total) by mouth daily with breakfast.  30 tablet  3  . docusate sodium (COLACE) 100 MG capsule Take 100 mg by mouth daily.      . metoprolol tartrate (LOPRESSOR) 25 MG tablet Take 1 tablet (25 mg total) by mouth 2 (two) times daily.  60 tablet  3  . nitroGLYCERIN (NITROSTAT) 0.4 MG SL tablet Place 1 tablet (0.4 mg total) under the tongue every 5 (five) minutes x 3 doses as needed for chest pain.  25  tablet  11  . polyethylene glycol (MIRALAX / GLYCOLAX) packet Take 17 g by mouth daily as needed. For constipation      . rosuvastatin (CRESTOR) 40 MG tablet Take 1 tablet (40 mg total) by mouth daily.  30 tablet  3  . traMADol (ULTRAM) 50 MG tablet Take 50 mg by mouth every 6 (six) hours as needed. For pain        No Known Allergies  Past Medical History  Diagnosis Date  . Stroke   . Hypertension   . Aphasia   . Anorexia   . ST elevation MI (STEMI) 08/2011    Inferior wall MI with severe diffuse CAD (90% prox LAD, 70% mid LAD, 80% prox DX1, 70% mid LCX & 100% RCA) with BMS to the RCA. Not felt to be a surgical candidate due to her poor neurologic condition with no hope for recovery. Will be managed medically  . Hyperlipidemia   . Hydrocephalus     failed improvement with lumbar drain placement - managed by [facility] neurology  . Acoustic neuroma   . Diastolic dysfunction     Per echo 08/2011 with moderate LVH, normal EF at 55 to 60%, & severe hypokinesis of the inferior wall. Grade 1 diastolic dysfunction    Past Surgical History  Procedure Date  . Coronary stent placement 08/18/2011    BMS to the  RCA    History  Smoking status  . Never Smoker   Smokeless tobacco  . Never Used    History  Alcohol Use No    History reviewed. No pertinent family history.  Review of Systems: The review of systems is per the HPI.  All other systems were reviewed and are negative.  Physical Exam: BP 118/70  Pulse 62  Ht 5\' 1"  (1.549 m)  Wt 146 lb (66.225 kg)  BMI 27.59 kg/m2  SpO2 96% Weight is questionable. She is not able to stand independently.  Patient is very pleasant and in no acute distress. She is in a wheelchair. She does not speak. She does smile. Skin is warm and dry. Color is normal.  HEENT is unremarkable. Normocephalic/atraumatic. PERRL. Sclera are nonicteric. Neck is supple. No masses. No JVD. Lungs are fairly clear but with poor effort. Cardiac exam shows a regular rate and  rhythm. Abdomen is soft. Extremities are without edema. Gait and ROM are intact. No gross neurologic deficits noted.   LABORATORY DATA: PENDING  Assessment / Plan:

## 2011-09-06 NOTE — Assessment & Plan Note (Signed)
Patient has had recent inferior STEMI treated with emergent cath. Has severe diffuse 3 vessel disease. Culprit lesion in the RCA was treated with a bare metal stent. She was not felt to be a candidate for CABG for fully revascularization due to her neurologic condition. She seems to be doing ok. Will check follow up BMET and CBC today. Will plan on seeing her back in about 6 weeks with fasting labs. Patient's son is agreeable to this plan and will call if any problems develop in the interim.

## 2011-09-14 ENCOUNTER — Telehealth: Payer: Self-pay | Admitting: Cardiology

## 2011-09-14 NOTE — Telephone Encounter (Signed)
New msg fyi Speech therapist called from [facility] wanted you to know she didn't see pt this week because of therapist's illness. She didn't need a call back

## 2011-09-14 NOTE — Telephone Encounter (Signed)
FU Call: Home Health PT nurse calling wanting to get extension of orders for home health PT. Please return call to discuss further.

## 2011-09-14 NOTE — Telephone Encounter (Signed)
Dr.Jordan was made aware. 

## 2011-09-17 ENCOUNTER — Telehealth: Payer: Self-pay | Admitting: Cardiology

## 2011-09-17 NOTE — Telephone Encounter (Signed)
09/17/11--pt therapy ([facility])  calling wanting to proceed with swallowing therapy , along with speech and ? Physical therapy --advised to continue with therapy--nt

## 2011-09-17 NOTE — Telephone Encounter (Signed)
OK to proceed with therapies. Florian Chauca Swaziland MD, [facility]

## 2011-09-17 NOTE — Telephone Encounter (Signed)
New msg Amy from [facility] wanted to talk to you about therapy orders

## 2011-09-21 ENCOUNTER — Telehealth: Payer: Self-pay | Admitting: Cardiology

## 2011-09-21 NOTE — Telephone Encounter (Signed)
Sherry from [facility] called, okay with Dr Swaziland to continue b/p checks and baths.

## 2011-09-21 NOTE — Telephone Encounter (Signed)
Needs order called into [facility] for her to continue BP checks, and for bath nurse to continue baths

## 2011-09-26 ENCOUNTER — Telehealth: Payer: Self-pay | Admitting: Nurse Practitioner

## 2011-09-26 NOTE — Telephone Encounter (Signed)
Discharge & H&P faxed to [facility] @ [phone] 09/26/11/KM

## 2011-10-01 HISTORY — PX: OTHER SURGICAL HISTORY: SHX169

## 2011-10-24 ENCOUNTER — Ambulatory Visit (INDEPENDENT_AMBULATORY_CARE_PROVIDER_SITE_OTHER): Payer: Medicare Other | Admitting: Nurse Practitioner

## 2011-10-24 ENCOUNTER — Other Ambulatory Visit: Payer: Self-pay

## 2011-10-24 ENCOUNTER — Encounter: Payer: Self-pay | Admitting: Nurse Practitioner

## 2011-10-24 VITALS — BP 110/80 | HR 66 | Ht 62.0 in | Wt 142.0 lb

## 2011-10-24 DIAGNOSIS — E785 Hyperlipidemia, unspecified: Secondary | ICD-10-CM

## 2011-10-24 DIAGNOSIS — G911 Obstructive hydrocephalus: Secondary | ICD-10-CM

## 2011-10-24 DIAGNOSIS — I2510 Atherosclerotic heart disease of native coronary artery without angina pectoris: Secondary | ICD-10-CM

## 2011-10-24 DIAGNOSIS — E7800 Pure hypercholesterolemia, unspecified: Secondary | ICD-10-CM

## 2011-10-24 DIAGNOSIS — I10 Essential (primary) hypertension: Secondary | ICD-10-CM

## 2011-10-24 DIAGNOSIS — G919 Hydrocephalus, unspecified: Secondary | ICD-10-CM

## 2011-10-24 LAB — HEPATIC FUNCTION PANEL
ALT: 20 U/L (ref 0–35)
AST: 22 U/L (ref 0–37)
Albumin: 3.8 g/dL (ref 3.5–5.2)
Alkaline Phosphatase: 47 U/L (ref 39–117)
Bilirubin, Direct: 0 mg/dL (ref 0.0–0.3)
Total Bilirubin: 0.2 mg/dL — ABNORMAL LOW (ref 0.3–1.2)
Total Protein: 7.3 g/dL (ref 6.0–8.3)

## 2011-10-24 LAB — LIPID PANEL
Cholesterol: 142 mg/dL (ref 0–200)
HDL: 41 mg/dL (ref >39.00)
Total CHOL/HDL Ratio: 3
Triglycerides: 204 mg/dL — ABNORMAL HIGH (ref 0.0–149.0)
VLDL: 40.8 mg/dL — ABNORMAL HIGH (ref 0.0–40.0)

## 2011-10-24 LAB — BASIC METABOLIC PANEL
BUN: 12 mg/dL (ref 6–23)
CO2: 25 mEq/L (ref 19–32)
Calcium: 9.1 mg/dL (ref 8.4–10.5)
Chloride: 108 mEq/L (ref 96–112)
Creatinine, Ser: 0.7 mg/dL (ref 0.4–1.2)
GFR: 91.18 mL/min (ref >60.00)
Glucose, Bld: 97 mg/dL (ref 70–99)
Potassium: 4.1 mEq/L (ref 3.5–5.1)
Sodium: 141 mEq/L (ref 135–145)

## 2011-10-24 LAB — LDL CHOLESTEROL, DIRECT: Direct LDL: 78.2 mg/dL

## 2011-10-24 NOTE — Assessment & Plan Note (Signed)
Has had recent STEMI with BMS to the RCA. Had severe and diffuse CAD and CABG was initially recommended. Currently doing well with no symptoms. Her neurologic status is now improving and she looks much stronger. Will see her back in 2 months. No change with her current medicines for now. Will have her see Dr. Swaziland on return. ? If her neurologic status continues to improve if she would be a surgical candidate. Patient and her son are agreeable to this plan and will call if any problems develop in the interim.

## 2011-10-24 NOTE — Progress Notes (Signed)
[name] Date of Birth: [birth year] Medical Record #[id number]  History of Present Illness: [name] is seen back today for a 6 week check. She is seen for Dr. Swazil[place]. She had an inferior STEMI back in February with BMS to the RCA which was the culprit lesion. She did have severe diffuse CAD but was not a candidate for CABG due to her poor neurologic condition. She has a normal pressure hydrocephalus and has failed with lumbar drain placement.  Her EF was normal at 55 to 60% with moderate LVH, severe hypokinesis of the inferior wall and grade 1 diastolic dysfunction. She remained in quite a debilitated state and could no longer walk, was aphasic, eating poorly and losing weight. She is followed by neurology at [facility].  She comes in today. She is here with her son and an interpreter. She has made a complete turn around. She had her drain revised at [facility] since her last visit. She can now talk. She is back walking short distances. No chest pain reported. Not short of breath. Tolerating her medicines. Not dizzy or lightheaded. Needs lipids rechecked today.   Current Outpatient Prescriptions on File Prior to Visit  Medication Sig Dispense Refill  . aspirin EC 81 MG EC tablet Take 1 tablet (81 mg total) by mouth daily.      . clopidogrel (PLAVIX) 75 MG tablet Take 1 tablet (75 mg total) by mouth daily with breakfast.  30 tablet  3  . docusate sodium (COLACE) 100 MG capsule Take 100 mg by mouth daily.      . metoprolol tartrate (LOPRESSOR) 25 MG tablet Take 1 tablet (25 mg total) by mouth 2 (two) times daily.  60 tablet  3  . nitroGLYCERIN (NITROSTAT) 0.4 MG SL tablet Place 1 tablet (0.4 mg total) under the tongue every 5 (five) minutes x 3 doses as needed for chest pain.  25 tablet  11  . polyethylene glycol (MIRALAX / GLYCOLAX) packet Take 17 g by mouth daily as needed. For constipation      . rosuvastatin (CRESTOR) 40 MG tablet Take 1 tablet (40 mg total) by mouth daily.  30 tablet  3  . traMADol  (ULTRAM) 50 MG tablet Take 50 mg by mouth every 6 (six) hours as needed. For pain        No Known Allergies  Past Medical History  Diagnosis Date  . Stroke   . Hypertension   . Aphasia   . Anorexia   . ST elevation MI (STEMI) 08/2011    Inferior wall MI with severe diffuse CAD (90% prox LAD, 70% mid LAD, 80% prox DX1, 70% mid LCX & 100% RCA) with BMS to the RCA. Not felt to be a surgical candidate due to her poor neurologic condition with no hope for recovery. Will be managed medically  . Hyperlipidemia   . Hydrocephalus     failed improvement with lumbar drain placement - managed by [facility] neurology  . Acoustic neuroma   . Diastolic dysfunction     Per echo 08/2011 with moderate LVH, normal EF at 55 to 60%, & severe hypokinesis of the inferior wall. Grade 1 diastolic dysfunction    Past Surgical History  Procedure Date  . Coronary stent placement 08/18/2011    BMS to the RCA    History  Smoking status  . Never Smoker   Smokeless tobacco  . Never Used    History  Alcohol Use No    No family history on file.  Review of Systems: The review of systems is per the HPI.  All other systems were reviewed and are negative.  Physical Exam: Ht 5\' 2"  (1.575 m)  Wt 142 lb (64.411 kg)  BMI 25.97 kg/m2 Patient is very pleasant and in no acute distress. She is in a wheelchair but was able to get up on the exam table.  She is now speaking and is very alert. Skin is warm and dry. Color is normal.  HEENT is unremarkable. Normocephalic/atraumatic. PERRL. Sclera are nonicteric. Neck is supple. No masses. No JVD. Lungs are clear. Cardiac exam shows a regular rate and rhythm. Abdomen is soft. Extremities are without edema. Gait and ROM are intact. No gross neurologic deficits noted.   LABORATORY DATA: Lab Results  Component Value Date   WBC 6.9 09/06/2011   HGB 12.1 09/06/2011   HCT 36.3 09/06/2011   PLT 234.0 09/06/2011   GLUCOSE 117* 09/06/2011   CHOL 306* 08/19/2011   TRIG 127 08/19/2011   HDL  60 08/19/2011   LDLCALC 221* 08/19/2011   ALT 37* 08/18/2011   AST 115* 08/18/2011   NA 140 09/06/2011   K 4.1 09/06/2011   CL 106 09/06/2011   CREATININE 0.7 09/06/2011   BUN 18 09/06/2011   CO2 26 09/06/2011   TSH 1.201 08/18/2011   INR 1.15 08/18/2011   HGBA1C 5.7* 08/18/2011     Assessment / Plan:

## 2011-10-24 NOTE — Assessment & Plan Note (Signed)
She has had revision of her drain with dramatic improvement in her neurologic function.

## 2011-10-24 NOTE — Assessment & Plan Note (Signed)
Blood pressure looks good. No change with her current medicines.

## 2011-10-24 NOTE — Patient Instructions (Addendum)
I think you are doing well.  Keep getting stronger  We will recheck your cholesterol levels today  See Dr. Swaziland in 2 months.  Call the [facility] office at [phone] if you have any questions, problems or concerns.

## 2011-10-24 NOTE — Assessment & Plan Note (Signed)
Rechecking labs today.

## 2011-12-25 ENCOUNTER — Encounter: Payer: Self-pay | Admitting: Cardiology

## 2011-12-25 ENCOUNTER — Ambulatory Visit (INDEPENDENT_AMBULATORY_CARE_PROVIDER_SITE_OTHER): Payer: Medicare Other | Admitting: Cardiology

## 2011-12-25 VITALS — BP 124/68 | HR 80 | Ht 62.0 in | Wt 143.2 lb

## 2011-12-25 DIAGNOSIS — E785 Hyperlipidemia, unspecified: Secondary | ICD-10-CM

## 2011-12-25 DIAGNOSIS — G919 Hydrocephalus, unspecified: Secondary | ICD-10-CM

## 2011-12-25 DIAGNOSIS — G911 Obstructive hydrocephalus: Secondary | ICD-10-CM

## 2011-12-25 DIAGNOSIS — E7800 Pure hypercholesterolemia, unspecified: Secondary | ICD-10-CM

## 2011-12-25 DIAGNOSIS — I2510 Atherosclerotic heart disease of native coronary artery without angina pectoris: Secondary | ICD-10-CM

## 2011-12-25 NOTE — Assessment & Plan Note (Signed)
She is made a good recovery from her myocardial infarction. She has no recurrent anginal symptoms at this time. Her LV function was previously good. She has no evidence of congestive heart failure or arrhythmias. Previously she was not considered a candidate for surgery because of her neurologic status. This has improved quite dramatically. I discussed with the patient and her family the possibility reconsidering her for bypass surgery. While they are in agreement with this they would like to give her more time to recover from her previous illnesses. I will plan on seeing her back again in 3 months. She is still being followed for her acoustic neuroma by neurosurgery but I believe this is going to be managed conservatively and that most of her neurologic deficit was related to hydrocephalus.

## 2011-12-25 NOTE — Patient Instructions (Signed)
Continue the same medications  I will see you again in 3 months.  We may consider the option of coronary bypass surgery in the future to minimize her long term cardiac risk.

## 2011-12-25 NOTE — Assessment & Plan Note (Signed)
Lab work was reviewed from April was acceptable. We will continue with her current statin therapy.

## 2011-12-25 NOTE — Progress Notes (Signed)
[name] Date of Birth: [birth year] Medical Record #[id number]  History of Present Illness: [name] is seen back today for a followup visit. She had an inferior STEMI back in February with BMS to the RCA which was the culprit lesion. She did have severe three-vessel CAD but was not a candidate for CABG due to her poor neurologic condition. She has a normal pressure hydrocephalus and had failed with lumbar drain placement.  Her EF was normal at 55 to 60% with moderate LVH, severe hypokinesis of the inferior wall and grade 1 diastolic dysfunction. She remained in quite a debilitated state and could no longer walk, was aphasic, eating poorly and losing weight. Since her heart attack she did undergo placement of a drain by neurosurgery at [facility]. She is unremarkable recovery since then. Her speech is now normal. She is back walking 2, church per day. She denies any chest pain, shortness of breath, or palpitations. Her only complaint is that her legs are nervous when she walks.   Current Outpatient Prescriptions on File Prior to Visit  Medication Sig Dispense Refill  . aspirin EC 81 MG EC tablet Take 1 tablet (81 mg total) by mouth daily.      . clopidogrel (PLAVIX) 75 MG tablet Take 1 tablet (75 mg total) by mouth daily with breakfast.  30 tablet  3  . docusate sodium (COLACE) 100 MG capsule Take 100 mg by mouth daily.      . metoprolol tartrate (LOPRESSOR) 25 MG tablet Take 1 tablet (25 mg total) by mouth 2 (two) times daily.  60 tablet  3  . nitroGLYCERIN (NITROSTAT) 0.4 MG SL tablet Place 1 tablet (0.4 mg total) under the tongue every 5 (five) minutes x 3 doses as needed for chest pain.  25 tablet  11  . polyethylene glycol (MIRALAX / GLYCOLAX) packet Take 17 g by mouth daily as needed. For constipation      . rosuvastatin (CRESTOR) 40 MG tablet Take 1 tablet (40 mg total) by mouth daily.  30 tablet  3  . traMADol (ULTRAM) 50 MG tablet Take 50 mg by mouth every 6 (six) hours as needed. For pain         No Known Allergies  Past Medical History  Diagnosis Date  . Stroke   . Hypertension   . Anorexia   . ST elevation MI (STEMI) 08/2011    Inferior wall MI with severe diffuse CAD (90% prox LAD, 70% mid LAD, 80% prox DX1, 70% mid LCX & 100% RCA) with BMS to the RCA. Not felt to be a surgical candidate due to her poor neurologic condition with no Kylie for recovery. Will be managed medically  . Hyperlipidemia   . Hydrocephalus     failed improvement with lumbar drain placement - managed by [facility] neurology; revised in March 2013 with dramatic improvement  . Acoustic neuroma   . Diastolic dysfunction     Per echo 08/2011 with moderate LVH, normal EF at 55 to 60%, & severe hypokinesis of the inferior wall. Grade 1 diastolic dysfunction    Past Surgical History  Procedure Date  . Coronary stent placement 08/18/2011    BMS to the RCA  . Intracranial shunt april 2013    History  Smoking status  . Never Smoker   Smokeless tobacco  . Never Used    History  Alcohol Use No    No family history on file.  Review of Systems: The review of systems is per  the HPI.  All other systems were reviewed and are negative.  Physical Exam: BP 124/68  Pulse 80  Ht 5\' 2"  (1.575 m)  Wt 143 lb 3.2 oz (64.955 kg)  BMI 26.19 kg/m2 Patient is very pleasant and in no acute distress. She is walking without any support and appears quite stable.  She is now speaking and is very alert. Skin is warm and dry. Color is normal.  HEENT is unremarkable. Normocephalic/atraumatic. PERRL. Sclera are nonicteric. Neck is supple. No masses. No JVD. Lungs are clear. Cardiac exam shows a regular rate and rhythm. Abdomen is soft. Extremities are without edema. Gait and ROM are intact. No gross neurologic deficits noted.   LABORATORY DATA:   Assessment / Plan:

## 2012-01-06 ENCOUNTER — Other Ambulatory Visit (HOSPITAL_COMMUNITY): Payer: Self-pay | Admitting: Physician Assistant

## 2012-01-19 ENCOUNTER — Emergency Department: Payer: Self-pay | Admitting: Emergency Medicine

## 2012-01-20 LAB — URINALYSIS, COMPLETE
Bilirubin,UR: NEGATIVE
Blood: NEGATIVE
Glucose,UR: NEGATIVE mg/dL (ref 0–75)
Nitrite: NEGATIVE
Ph: 7 (ref 4.5–8.0)
Protein: 30
RBC,UR: 1 /HPF (ref 0–5)
Specific Gravity: 1.014 (ref 1.003–1.030)
Squamous Epithelial: 1
WBC UR: 33 /HPF (ref 0–5)

## 2012-01-20 LAB — CBC
HCT: 39 % (ref 35.0–47.0)
HGB: 12.9 g/dL (ref 12.0–16.0)
MCH: 31 pg (ref 26.0–34.0)
MCHC: 33.1 g/dL (ref 32.0–36.0)
MCV: 94 fL (ref 80–100)
Platelet: 188 10*3/uL (ref 150–440)
RBC: 4.16 10*6/uL (ref 3.80–5.20)
RDW: 13.5 % (ref 11.5–14.5)
WBC: 8.6 10*3/uL (ref 3.6–11.0)

## 2012-01-20 LAB — BASIC METABOLIC PANEL
Anion Gap: 10 (ref 7–16)
BUN: 16 mg/dL (ref 7–18)
Calcium, Total: 8.8 mg/dL (ref 8.5–10.1)
Chloride: 104 mmol/L (ref 98–107)
Co2: 29 mmol/L (ref 21–32)
Creatinine: 0.8 mg/dL (ref 0.60–1.30)
EGFR (African American): >60
EGFR (Non-African Amer.): >60
Glucose: 143 mg/dL — ABNORMAL HIGH (ref 65–99)
Osmolality: 289 (ref 275–301)
Potassium: 3.4 mmol/L — ABNORMAL LOW (ref 3.5–5.1)
Sodium: 143 mmol/L (ref 136–145)

## 2012-01-20 LAB — CK TOTAL AND CKMB (NOT AT ARMC)
CK, Total: 82 U/L (ref 21–215)
CK-MB: 0.6 ng/mL (ref 0.5–3.6)

## 2012-01-20 LAB — TROPONIN I: Troponin-I: <0.02 ng/mL

## 2012-02-07 ENCOUNTER — Emergency Department: Payer: Self-pay | Admitting: Emergency Medicine

## 2012-02-07 LAB — CBC WITH DIFFERENTIAL/PLATELET
Basophil #: 0 10*3/uL (ref 0.0–0.1)
Basophil %: 0.3 %
Eosinophil #: 0 10*3/uL (ref 0.0–0.7)
Eosinophil %: 0.1 %
HCT: 41 % (ref 35.0–47.0)
HGB: 13.8 g/dL (ref 12.0–16.0)
Lymphocyte #: 1.9 10*3/uL (ref 1.0–3.6)
Lymphocyte %: 16 %
MCH: 30.8 pg (ref 26.0–34.0)
MCHC: 33.6 g/dL (ref 32.0–36.0)
MCV: 92 fL (ref 80–100)
Monocyte #: 0.6 x10 3/mm (ref 0.2–0.9)
Monocyte %: 5.2 %
Neutrophil #: 9.3 10*3/uL — ABNORMAL HIGH (ref 1.4–6.5)
Neutrophil %: 78.4 %
Platelet: 216 10*3/uL (ref 150–440)
RBC: 4.48 10*6/uL (ref 3.80–5.20)
RDW: 13.1 % (ref 11.5–14.5)
WBC: 11.9 10*3/uL — ABNORMAL HIGH (ref 3.6–11.0)

## 2012-02-07 LAB — COMPREHENSIVE METABOLIC PANEL
Albumin: 3.9 g/dL (ref 3.4–5.0)
Alkaline Phosphatase: 77 U/L (ref 50–136)
Anion Gap: 8 (ref 7–16)
BUN: 8 mg/dL (ref 7–18)
Bilirubin,Total: 0.6 mg/dL (ref 0.2–1.0)
Calcium, Total: 9.2 mg/dL (ref 8.5–10.1)
Chloride: 107 mmol/L (ref 98–107)
Co2: 29 mmol/L (ref 21–32)
Creatinine: 1.06 mg/dL (ref 0.60–1.30)
EGFR (African American): >60
EGFR (Non-African Amer.): 54 — ABNORMAL LOW
Glucose: 162 mg/dL — ABNORMAL HIGH (ref 65–99)
Osmolality: 289 (ref 275–301)
Potassium: 3.2 mmol/L — ABNORMAL LOW (ref 3.5–5.1)
SGOT(AST): 28 U/L (ref 15–37)
SGPT (ALT): 20 U/L (ref 12–78)
Sodium: 144 mmol/L (ref 136–145)
Total Protein: 8.1 g/dL (ref 6.4–8.2)

## 2012-02-07 LAB — CK TOTAL AND CKMB (NOT AT ARMC)
CK, Total: 69 U/L (ref 21–215)
CK-MB: 0.6 ng/mL (ref 0.5–3.6)

## 2012-02-07 LAB — TROPONIN I: Troponin-I: 0.03 ng/mL

## 2012-03-13 ENCOUNTER — Ambulatory Visit (INDEPENDENT_AMBULATORY_CARE_PROVIDER_SITE_OTHER): Payer: Medicare Other | Admitting: Cardiology

## 2012-03-13 ENCOUNTER — Encounter: Payer: Self-pay | Admitting: Cardiology

## 2012-03-13 VITALS — BP 124/80 | HR 60 | Ht 62.0 in | Wt 130.4 lb

## 2012-03-13 DIAGNOSIS — I2510 Atherosclerotic heart disease of native coronary artery without angina pectoris: Secondary | ICD-10-CM

## 2012-03-13 DIAGNOSIS — E7800 Pure hypercholesterolemia, unspecified: Secondary | ICD-10-CM

## 2012-03-13 DIAGNOSIS — I10 Essential (primary) hypertension: Secondary | ICD-10-CM

## 2012-03-13 NOTE — Patient Instructions (Signed)
Continue your current therapy  I will see you again in 4 months. 

## 2012-03-13 NOTE — Progress Notes (Signed)
[name] Date of Birth: [birth year] Medical Record #[id number]  History of Present Illness: [name] is seen back today for a followup visit. She is seen with an interpreter and her son who understands English fairly well. She had an inferior STEMI back in February with BMS to the RCA which was the culprit lesion. She did have severe three-vessel CAD but was not a candidate for CABG due to her poor neurologic condition. She has since undergone placement of a cerebral drain for hydrocephalus with fairly dramatic response. On followup today she is feeling well. She denies any chest pain or shortness of breath. Her only complaint is that she feels a little bit tired at times. 5 weeks ago she did have a little setback and had to have some revision of her drain. She is able to walk and is eating well.   Current Outpatient Prescriptions on File Prior to Visit  Medication Sig Dispense Refill  . aspirin EC 81 MG EC tablet Take 1 tablet (81 mg total) by mouth daily.      . clopidogrel (PLAVIX) 75 MG tablet TAKE 1 TABLET (75 MG TOTAL) BY MOUTH DAILY WITH BREAKFAST.  30 tablet  3  . CRESTOR 40 MG tablet TAKE 1 TABLET (40 MG TOTAL) BY MOUTH DAILY.  30 tablet  3  . docusate sodium (COLACE) 100 MG capsule Take 100 mg by mouth daily.      . metoprolol tartrate (LOPRESSOR) 25 MG tablet Take 1 tablet (25 mg total) by mouth 2 (two) times daily.  60 tablet  3  . nitroGLYCERIN (NITROSTAT) 0.4 MG SL tablet Place 1 tablet (0.4 mg total) under the tongue every 5 (five) minutes x 3 doses as needed for chest pain.  25 tablet  11  . polyethylene glycol (MIRALAX / GLYCOLAX) packet Take 17 g by mouth daily as needed. For constipation      . traMADol (ULTRAM) 50 MG tablet Take 50 mg by mouth every 6 (six) hours as needed. For pain        No Known Allergies  Past Medical History  Diagnosis Date  . Stroke   . Hypertension   . Anorexia   . ST elevation MI (STEMI) 08/2011    Inferior wall MI with severe diffuse CAD  (90% prox LAD, 70% mid LAD, 80% prox DX1, 70% mid LCX & 100% RCA) with BMS to the RCA. Not felt to be a surgical candidate due to her poor neurologic condition with no Kylie for recovery. Will be managed medically  . Hyperlipidemia   . Hydrocephalus     failed improvement with lumbar drain placement - managed by [facility] neurology; revised in March 2013 with dramatic improvement  . Acoustic neuroma   . Diastolic dysfunction     Per echo 08/2011 with moderate LVH, normal EF at 55 to 60%, & severe hypokinesis of the inferior wall. Grade 1 diastolic dysfunction    Past Surgical History  Procedure Date  . Coronary stent placement 08/18/2011    BMS to the RCA  . Intracranial shunt april 2013    History  Smoking status  . Never Smoker   Smokeless tobacco  . Never Used    History  Alcohol Use No    History reviewed. No pertinent family history.  Review of Systems: The review of systems is per the HPI.  All other systems were reviewed and are negative.  Physical Exam: BP 124/80  Pulse 60  Ht 5\' 2"  (1.575 m)  Wt 130 lb 6.4 oz (59.149 kg)  BMI 23.85 kg/m2  SpO2 95% Patient is very pleasant and in no acute distress. She is in a wheelchair today.  She is now speaking and is very alert. Skin is warm and dry. Color is normal.  HEENT is unremarkable. Normocephalic/atraumatic. PERRL. Sclera are nonicteric. Neck is supple. No masses. No JVD. Lungs are clear. Cardiac exam shows a regular rate and rhythm. Abdomen is soft. Extremities are without edema.  No gross neurologic deficits noted.   LABORATORY DATA:   Assessment / Plan: 1. Coronary disease status post inferior myocardial infarction in February 2013 treated with a bare-metal stent to the right coronary. She has severe three-vessel coronary disease. She is asymptomatic. We will continue with aspirin, Plavix, and metoprolol. While she has recovered quite a bit from a neurologic status I still think she is fairly high risk for bypass surgery.  Since she is asymptomatic I would not push the issue at this point. We will continue with medical management.  2. Hypertension, controlled.  3. Hypercholesterolemia. Continue high-dose statin therapy with Crestor.  4. Hydrocephalus. Significant improvement with placement of a drain.

## 2012-04-21 ENCOUNTER — Other Ambulatory Visit: Payer: Self-pay | Admitting: Cardiology

## 2012-04-24 ENCOUNTER — Other Ambulatory Visit: Payer: Medicare Other

## 2012-05-06 ENCOUNTER — Other Ambulatory Visit (INDEPENDENT_AMBULATORY_CARE_PROVIDER_SITE_OTHER): Payer: Medicare Other

## 2012-05-06 DIAGNOSIS — E7800 Pure hypercholesterolemia, unspecified: Secondary | ICD-10-CM

## 2012-05-06 DIAGNOSIS — I2510 Atherosclerotic heart disease of native coronary artery without angina pectoris: Secondary | ICD-10-CM

## 2012-05-06 LAB — HEPATIC FUNCTION PANEL
ALT: 18 U/L (ref 0–35)
AST: 23 U/L (ref 0–37)
Albumin: 4 g/dL (ref 3.5–5.2)
Alkaline Phosphatase: 40 U/L (ref 39–117)
Bilirubin, Direct: 0.1 mg/dL (ref 0.0–0.3)
Total Bilirubin: 0.7 mg/dL (ref 0.3–1.2)
Total Protein: 7.2 g/dL (ref 6.0–8.3)

## 2012-05-06 LAB — LIPID PANEL
Cholesterol: 152 mg/dL (ref 0–200)
HDL: 45.5 mg/dL (ref >39.00)
LDL Cholesterol: 79 mg/dL (ref 0–99)
Total CHOL/HDL Ratio: 3
Triglycerides: 139 mg/dL (ref 0.0–149.0)
VLDL: 27.8 mg/dL (ref 0.0–40.0)

## 2012-05-06 LAB — BASIC METABOLIC PANEL
BUN: 15 mg/dL (ref 6–23)
CO2: 28 mEq/L (ref 19–32)
Calcium: 9.1 mg/dL (ref 8.4–10.5)
Chloride: 105 mEq/L (ref 96–112)
Creatinine, Ser: 0.8 mg/dL (ref 0.4–1.2)
GFR: 80.07 mL/min (ref >60.00)
Glucose, Bld: 97 mg/dL (ref 70–99)
Potassium: 4 mEq/L (ref 3.5–5.1)
Sodium: 141 mEq/L (ref 135–145)

## 2012-06-09 ENCOUNTER — Other Ambulatory Visit: Payer: Self-pay

## 2012-06-09 MED ORDER — CLOPIDOGREL BISULFATE 75 MG PO TABS: 75.0000 mg | ORAL_TABLET | Freq: Every day | ORAL | Status: DC

## 2012-06-20 ENCOUNTER — Observation Stay: Payer: Self-pay | Admitting: Family Medicine

## 2012-06-20 LAB — COMPREHENSIVE METABOLIC PANEL
Albumin: 3.8 g/dL (ref 3.4–5.0)
Alkaline Phosphatase: 54 U/L (ref 50–136)
Anion Gap: 3 — ABNORMAL LOW (ref 7–16)
BUN: 12 mg/dL (ref 7–18)
Bilirubin,Total: 0.5 mg/dL (ref 0.2–1.0)
Calcium, Total: 9 mg/dL (ref 8.5–10.1)
Chloride: 113 mmol/L — ABNORMAL HIGH (ref 98–107)
Co2: 28 mmol/L (ref 21–32)
Creatinine: 0.72 mg/dL (ref 0.60–1.30)
EGFR (African American): >60
EGFR (Non-African Amer.): >60
Glucose: 92 mg/dL (ref 65–99)
Osmolality: 286 (ref 275–301)
Potassium: 4 mmol/L (ref 3.5–5.1)
SGOT(AST): 23 U/L (ref 15–37)
SGPT (ALT): 21 U/L (ref 12–78)
Sodium: 144 mmol/L (ref 136–145)
Total Protein: 7.4 g/dL (ref 6.4–8.2)

## 2012-06-20 LAB — URINALYSIS, COMPLETE
Bacteria: NONE SEEN
Bilirubin,UR: NEGATIVE
Blood: NEGATIVE
Glucose,UR: NEGATIVE mg/dL (ref 0–75)
Ketone: NEGATIVE
Leukocyte Esterase: NEGATIVE
Nitrite: NEGATIVE
Ph: 9 (ref 4.5–8.0)
Protein: NEGATIVE
RBC,UR: 1 /HPF (ref 0–5)
Specific Gravity: 1.005 (ref 1.003–1.030)
Squamous Epithelial: <1
WBC UR: 2 /HPF (ref 0–5)

## 2012-06-20 LAB — TROPONIN I
Troponin-I: <0.02 ng/mL
Troponin-I: <0.02 ng/mL

## 2012-06-20 LAB — CBC
HCT: 40.6 % (ref 35.0–47.0)
HGB: 13.7 g/dL (ref 12.0–16.0)
MCH: 31.5 pg (ref 26.0–34.0)
MCHC: 33.7 g/dL (ref 32.0–36.0)
MCV: 94 fL (ref 80–100)
Platelet: 176 10*3/uL (ref 150–440)
RBC: 4.34 10*6/uL (ref 3.80–5.20)
RDW: 13.6 % (ref 11.5–14.5)
WBC: 6.1 10*3/uL (ref 3.6–11.0)

## 2012-06-20 LAB — TSH: Thyroid Stimulating Horm: 0.958 u[IU]/mL

## 2012-06-21 ENCOUNTER — Inpatient Hospital Stay: Admission: AD | Admit: 2012-06-21 | Payer: Self-pay | Source: Other Acute Inpatient Hospital | Admitting: Neurosurgery

## 2012-06-21 LAB — CBC WITH DIFFERENTIAL/PLATELET
Basophil #: 0 10*3/uL (ref 0.0–0.1)
Basophil %: 0.6 %
Eosinophil #: 0.1 10*3/uL (ref 0.0–0.7)
Eosinophil %: 0.8 %
HCT: 36.8 % (ref 35.0–47.0)
HGB: 12.6 g/dL (ref 12.0–16.0)
Lymphocyte #: 2.5 10*3/uL (ref 1.0–3.6)
Lymphocyte %: 35.2 %
MCH: 32.2 pg (ref 26.0–34.0)
MCHC: 34.2 g/dL (ref 32.0–36.0)
MCV: 94 fL (ref 80–100)
Monocyte #: 0.5 x10 3/mm (ref 0.2–0.9)
Monocyte %: 7.1 %
Neutrophil #: 4 10*3/uL (ref 1.4–6.5)
Neutrophil %: 56.3 %
Platelet: 159 10*3/uL (ref 150–440)
RBC: 3.91 10*6/uL (ref 3.80–5.20)
RDW: 13.5 % (ref 11.5–14.5)
WBC: 7.1 10*3/uL (ref 3.6–11.0)

## 2012-06-21 LAB — BASIC METABOLIC PANEL
Anion Gap: 6 — ABNORMAL LOW (ref 7–16)
BUN: 15 mg/dL (ref 7–18)
Calcium, Total: 8.4 mg/dL — ABNORMAL LOW (ref 8.5–10.1)
Chloride: 111 mmol/L — ABNORMAL HIGH (ref 98–107)
Co2: 29 mmol/L (ref 21–32)
Creatinine: 1.04 mg/dL (ref 0.60–1.30)
EGFR (African American): >60
EGFR (Non-African Amer.): 55 — ABNORMAL LOW
Glucose: 88 mg/dL (ref 65–99)
Osmolality: 291 (ref 275–301)
Potassium: 3.8 mmol/L (ref 3.5–5.1)
Sodium: 146 mmol/L — ABNORMAL HIGH (ref 136–145)

## 2012-06-21 LAB — LIPID PANEL
Cholesterol: 137 mg/dL (ref 0–200)
HDL Cholesterol: 53 mg/dL (ref 40–60)
Ldl Cholesterol, Calc: 70 mg/dL (ref 0–100)
Triglycerides: 68 mg/dL (ref 0–200)
VLDL Cholesterol, Calc: 14 mg/dL (ref 5–40)

## 2012-06-21 LAB — TROPONIN I: Troponin-I: 0.03 ng/mL

## 2012-07-15 ENCOUNTER — Encounter: Payer: Self-pay | Admitting: Cardiology

## 2012-07-15 ENCOUNTER — Ambulatory Visit (INDEPENDENT_AMBULATORY_CARE_PROVIDER_SITE_OTHER): Payer: Medicare Other | Admitting: Cardiology

## 2012-07-15 VITALS — BP 128/76 | HR 61 | Ht 62.0 in | Wt 121.8 lb

## 2012-07-15 DIAGNOSIS — I2119 ST elevation (STEMI) myocardial infarction involving other coronary artery of inferior wall: Secondary | ICD-10-CM

## 2012-07-15 DIAGNOSIS — I10 Essential (primary) hypertension: Secondary | ICD-10-CM

## 2012-07-15 DIAGNOSIS — E7800 Pure hypercholesterolemia, unspecified: Secondary | ICD-10-CM

## 2012-07-15 DIAGNOSIS — I2510 Atherosclerotic heart disease of native coronary artery without angina pectoris: Secondary | ICD-10-CM

## 2012-07-15 DIAGNOSIS — E872 Acidosis: Secondary | ICD-10-CM

## 2012-07-15 MED ORDER — ISOSORBIDE MONONITRATE ER 30 MG PO TB24: 30.0000 mg | ORAL_TABLET | Freq: Every day | ORAL | Status: DC

## 2012-07-15 MED ORDER — NITROGLYCERIN 0.4 MG SL SUBL: 0.4000 mg | SUBLINGUAL_TABLET | SUBLINGUAL | Status: DC | PRN

## 2012-07-15 NOTE — Patient Instructions (Addendum)
Keep your follow up with your physician at [facility].  I am happy to see you as needed.

## 2012-07-16 ENCOUNTER — Encounter: Payer: Self-pay | Admitting: Cardiology

## 2012-07-16 ENCOUNTER — Ambulatory Visit: Payer: Self-pay | Admitting: Gastroenterology

## 2012-07-16 MED ORDER — ISOSORBIDE DINITRATE 10 MG PO TABS: 10.0000 mg | ORAL_TABLET | Freq: Two times a day (BID) | ORAL | Status: DC

## 2012-07-16 NOTE — Progress Notes (Signed)
[name] Date of Birth: 05-09-1941 Medical Record #[id number]  History of Present Illness: [name] is seen back today for a followup visit. She is seen with an interpreter and her son who understands English fairly well. She had an inferior STEMI back in February 2013 with BMS to the RCA which was the culprit lesion. She did have severe three-vessel CAD but was not a candidate for CABG due to her poor neurologic condition. She has since undergone placement of a cerebral drain for hydrocephalus with fairly dramatic response. On followup today she is doing much worse. She is weaker and  is back in a wheelchair. Her appetite has decreased and she has lost about 9 pounds. She complains of symptoms of increased fatigue, chest pain, shortness of breath. Her son states she is no longer able to walk any distance and sits in one place. Over the past 2 weeks she has had increasing symptoms of chest pain. Her pain is midsternal and radiates to the left shoulder. She has increased dyspnea. She has been using nitroglycerin 3 times a day for the past 3 weeks. After her initial interview and examination I formulated a plan for evaluation and treatment of her increased chest pain. At the end of this conversation the son mentioned that she had been hospitalized since her last visit here. It turns out that she had presented with an acute ST elevation myocardial infarction on December 21 at [facility]. She was transported to [facility] where she underwent repeat stenting of the right coronary. She has since been followed in the cardiology clinic at [facility] and has an appointment scheduled there today. This was confirmed by discussion with the nurse practitioner at [facility] today.   Current Outpatient Prescriptions on File Prior to Visit  Medication Sig Dispense Refill  . aspirin EC 81 MG EC tablet Take 1 tablet (81 mg total) by mouth daily.      . clopidogrel (PLAVIX) 75 MG tablet Take 1 tablet (75 mg total)  by mouth daily.  30 tablet  3  . metoprolol tartrate (LOPRESSOR) 25 MG tablet Take 1 tablet (25 mg total) by mouth 2 (two) times daily.  60 tablet  3  . nitroGLYCERIN (NITROSTAT) 0.4 MG SL tablet Place 1 tablet (0.4 mg total) under the tongue every 5 (five) minutes x 3 doses as needed for chest pain.  25 tablet  5  . traMADol (ULTRAM) 50 MG tablet Take 50 mg by mouth every 6 (six) hours as needed. For pain        No Known Allergies  Past Medical History  Diagnosis Date  . Stroke   . Hypertension   . Anorexia   . ST elevation MI (STEMI) 08/2011    Inferior wall MI with severe diffuse CAD (90% prox LAD, 70% mid LAD, 80% prox DX1, 70% mid LCX & 100% RCA) with BMS to the RCA. Not felt to be a surgical candidate due to her poor neurologic condition with no Kylie for recovery. Will be managed medically  . Hyperlipidemia   . Hydrocephalus     failed improvement with lumbar drain placement - managed by [facility] neurology; revised in March 2013 with dramatic improvement  . Acoustic neuroma   . Diastolic dysfunction     Per echo 08/2011 with moderate LVH, normal EF at 55 to 60%, & severe hypokinesis of the inferior wall. Grade 1 diastolic dysfunction    Past Surgical History  Procedure Date  . Coronary stent placement 08/18/2011  BMS to the RCA  . Intracranial shunt april 2013    History  Smoking status  . Never Smoker   Smokeless tobacco  . Never Used    History  Alcohol Use No    History reviewed. No pertinent family history.  Review of Systems: The review of systems is per the HPI.  All other systems were reviewed and are negative.  Physical Exam: BP 128/76  Pulse 61  Ht 5\' 2"  (1.575 m)  Wt 121 lb 12.8 oz (55.248 kg)  BMI 22.28 kg/m2  SpO2 99% Patient is very pleasant and in no acute distress. She is in a wheelchair today.  She is very alert. Skin is warm and dry. Color is normal.  HEENT is unremarkable. Normocephalic/atraumatic. PERRL. Sclera are nonicteric. Neck is supple.  No masses. No JVD. Lungs are clear. Cardiac exam shows a regular rate and rhythm. Abdomen is soft. Extremities are without edema.  No gross neurologic deficits noted.   LABORATORY DATA:   Assessment / Plan: 1. Coronary disease status post inferior myocardial infarction in February 2013 treated with a bare-metal stent to the right coronary. Repeat STEMI in December 2013 treated with repeat stenting. She has severe three-vessel coronary disease. She has had increasing anginal symptoms. This despite titration of her medications.  2. Hypertension, controlled.  3. Hypercholesterolemia. Continue high-dose statin therapy   4. Hydrocephalus. Status post placement of drain.  5. Acoustic neuroma.  After extensive discussion with the patient and her family that lasted over one hour I recommended that they keep their appointment at [facility] today. Unfortunately I have not been involved in her care and decision making process of the past 2 months. For continuity sake I think it would be best for her to continue her care solely at [facility] since this is also where her neurology and neurosurgery are located. Her treatment plan will be determined by her physicians there.

## 2012-07-17 LAB — PATHOLOGY REPORT

## 2012-08-03 ENCOUNTER — Emergency Department: Payer: Self-pay | Admitting: Unknown Physician Specialty

## 2012-08-03 LAB — BASIC METABOLIC PANEL
Anion Gap: 10 (ref 7–16)
BUN: 26 mg/dL — ABNORMAL HIGH (ref 7–18)
Calcium, Total: 9 mg/dL (ref 8.5–10.1)
Chloride: 107 mmol/L (ref 98–107)
Co2: 27 mmol/L (ref 21–32)
Creatinine: 1.01 mg/dL (ref 0.60–1.30)
EGFR (African American): >60
EGFR (Non-African Amer.): 57 — ABNORMAL LOW
Glucose: 110 mg/dL — ABNORMAL HIGH (ref 65–99)
Osmolality: 292 (ref 275–301)
Potassium: 4 mmol/L (ref 3.5–5.1)
Sodium: 144 mmol/L (ref 136–145)

## 2012-08-03 LAB — HEPATIC FUNCTION PANEL A (ARMC)
Albumin: 4 g/dL (ref 3.4–5.0)
Alkaline Phosphatase: 60 U/L (ref 50–136)
Bilirubin, Direct: 0.2 mg/dL (ref 0.00–0.20)
Bilirubin,Total: 0.6 mg/dL (ref 0.2–1.0)
SGOT(AST): 26 U/L (ref 15–37)
SGPT (ALT): 22 U/L (ref 12–78)
Total Protein: 7.3 g/dL (ref 6.4–8.2)

## 2012-08-03 LAB — LIPASE, BLOOD: Lipase: 305 U/L (ref 73–393)

## 2012-08-03 LAB — PROTIME-INR
INR: 0.9
Prothrombin Time: 12.7 secs (ref 11.5–14.7)

## 2012-08-03 LAB — TROPONIN I: Troponin-I: 0.04 ng/mL

## 2012-08-03 LAB — CK TOTAL AND CKMB (NOT AT ARMC)
CK, Total: 93 U/L (ref 21–215)
CK-MB: 1.8 ng/mL (ref 0.5–3.6)

## 2012-08-03 LAB — CBC
HCT: 37.9 % (ref 35.0–47.0)
HGB: 12.8 g/dL (ref 12.0–16.0)
MCH: 32 pg (ref 26.0–34.0)
MCHC: 33.8 g/dL (ref 32.0–36.0)
MCV: 95 fL (ref 80–100)
Platelet: 178 10*3/uL (ref 150–440)
RBC: 3.99 10*6/uL (ref 3.80–5.20)
RDW: 14.1 % (ref 11.5–14.5)
WBC: 8 10*3/uL (ref 3.6–11.0)

## 2012-08-03 LAB — MAGNESIUM: Magnesium: 2.2 mg/dL

## 2012-08-15 ENCOUNTER — Observation Stay: Payer: Self-pay | Admitting: Internal Medicine

## 2012-08-15 LAB — COMPREHENSIVE METABOLIC PANEL
Albumin: 3.9 g/dL (ref 3.4–5.0)
Alkaline Phosphatase: 55 U/L (ref 50–136)
Anion Gap: 5 — ABNORMAL LOW (ref 7–16)
BUN: 11 mg/dL (ref 7–18)
Bilirubin,Total: 0.5 mg/dL (ref 0.2–1.0)
Calcium, Total: 9 mg/dL (ref 8.5–10.1)
Chloride: 111 mmol/L — ABNORMAL HIGH (ref 98–107)
Co2: 30 mmol/L (ref 21–32)
Creatinine: 0.8 mg/dL (ref 0.60–1.30)
EGFR (African American): >60
EGFR (Non-African Amer.): >60
Glucose: 106 mg/dL — ABNORMAL HIGH (ref 65–99)
Osmolality: 290 (ref 275–301)
Potassium: 4.1 mmol/L (ref 3.5–5.1)
SGOT(AST): 27 U/L (ref 15–37)
SGPT (ALT): 22 U/L (ref 12–78)
Sodium: 146 mmol/L — ABNORMAL HIGH (ref 136–145)
Total Protein: 7.6 g/dL (ref 6.4–8.2)

## 2012-08-15 LAB — URINALYSIS, COMPLETE
Bilirubin,UR: NEGATIVE
Blood: NEGATIVE
Glucose,UR: NEGATIVE mg/dL (ref 0–75)
Ketone: NEGATIVE
Nitrite: NEGATIVE
Ph: 8 (ref 4.5–8.0)
Protein: NEGATIVE
RBC,UR: 1 /HPF (ref 0–5)
Specific Gravity: 1.01 (ref 1.003–1.030)
Squamous Epithelial: <1
WBC UR: 4 /HPF (ref 0–5)

## 2012-08-15 LAB — CK TOTAL AND CKMB (NOT AT ARMC)
CK, Total: 93 U/L (ref 21–215)
CK, Total: 97 U/L (ref 21–215)
CK, Total: 90 U/L (ref 21–215)
CK-MB: <0.5 ng/mL — ABNORMAL LOW (ref 0.5–3.6)
CK-MB: 0.5 ng/mL (ref 0.5–3.6)
CK-MB: 0.7 ng/mL (ref 0.5–3.6)

## 2012-08-15 LAB — CBC
HCT: 36.4 % (ref 35.0–47.0)
HGB: 12 g/dL (ref 12.0–16.0)
MCH: 31.6 pg (ref 26.0–34.0)
MCHC: 32.9 g/dL (ref 32.0–36.0)
MCV: 96 fL (ref 80–100)
Platelet: 198 10*3/uL (ref 150–440)
RBC: 3.79 10*6/uL — ABNORMAL LOW (ref 3.80–5.20)
RDW: 14.3 % (ref 11.5–14.5)
WBC: 8.6 10*3/uL (ref 3.6–11.0)

## 2012-08-15 LAB — TROPONIN I
Troponin-I: 0.04 ng/mL
Troponin-I: 0.04 ng/mL
Troponin-I: 0.04 ng/mL

## 2012-08-19 LAB — URINE CULTURE

## 2012-09-22 ENCOUNTER — Emergency Department: Payer: Self-pay | Admitting: Emergency Medicine

## 2012-09-22 LAB — COMPREHENSIVE METABOLIC PANEL
Albumin: 3.9 g/dL (ref 3.4–5.0)
Alkaline Phosphatase: 54 U/L (ref 50–136)
Anion Gap: 4 — ABNORMAL LOW (ref 7–16)
BUN: 14 mg/dL (ref 7–18)
Bilirubin,Total: 0.3 mg/dL (ref 0.2–1.0)
Calcium, Total: 8.6 mg/dL (ref 8.5–10.1)
Chloride: 109 mmol/L — ABNORMAL HIGH (ref 98–107)
Co2: 27 mmol/L (ref 21–32)
Creatinine: 0.85 mg/dL (ref 0.60–1.30)
EGFR (African American): >60
EGFR (Non-African Amer.): >60
Glucose: 136 mg/dL — ABNORMAL HIGH (ref 65–99)
Osmolality: 282 (ref 275–301)
Potassium: 3.7 mmol/L (ref 3.5–5.1)
SGOT(AST): 26 U/L (ref 15–37)
SGPT (ALT): 26 U/L (ref 12–78)
Sodium: 140 mmol/L (ref 136–145)
Total Protein: 7.9 g/dL (ref 6.4–8.2)

## 2012-09-22 LAB — LIPASE, BLOOD: Lipase: 231 U/L (ref 73–393)

## 2012-09-22 LAB — CBC
HCT: 37.6 % (ref 35.0–47.0)
HGB: 12.2 g/dL (ref 12.0–16.0)
MCH: 30.6 pg (ref 26.0–34.0)
MCHC: 32.4 g/dL (ref 32.0–36.0)
MCV: 95 fL (ref 80–100)
Platelet: 210 10*3/uL (ref 150–440)
RBC: 3.97 10*6/uL (ref 3.80–5.20)
RDW: 13.9 % (ref 11.5–14.5)
WBC: 13.2 10*3/uL — ABNORMAL HIGH (ref 3.6–11.0)

## 2012-09-23 LAB — URINALYSIS, COMPLETE
Bacteria: NONE SEEN
Bilirubin,UR: NEGATIVE
Blood: NEGATIVE
Glucose,UR: NEGATIVE mg/dL (ref 0–75)
Hyaline Cast: 1
Nitrite: NEGATIVE
Ph: 7 (ref 4.5–8.0)
Protein: NEGATIVE
RBC,UR: 2 /HPF (ref 0–5)
Specific Gravity: 1.015 (ref 1.003–1.030)
Squamous Epithelial: <1
WBC UR: 4 /HPF (ref 0–5)

## 2012-09-23 LAB — CK TOTAL AND CKMB (NOT AT ARMC)
CK, Total: 107 U/L (ref 21–215)
CK-MB: 1.4 ng/mL (ref 0.5–3.6)

## 2012-09-23 LAB — TROPONIN I
Troponin-I: 0.03 ng/mL
Troponin-I: 0.03 ng/mL

## 2012-09-23 LAB — CK: CK, Total: 102 U/L (ref 21–215)

## 2012-09-23 LAB — CK-MB: CK-MB: 1.2 ng/mL (ref 0.5–3.6)

## 2012-10-10 ENCOUNTER — Emergency Department: Payer: Self-pay | Admitting: Emergency Medicine

## 2012-10-10 LAB — PROTIME-INR
INR: 0.9
Prothrombin Time: 12.4 secs (ref 11.5–14.7)

## 2012-10-11 LAB — COMPREHENSIVE METABOLIC PANEL
Albumin: 3.6 g/dL (ref 3.4–5.0)
Alkaline Phosphatase: 66 U/L (ref 50–136)
Anion Gap: 5 — ABNORMAL LOW (ref 7–16)
BUN: 16 mg/dL (ref 7–18)
Bilirubin,Total: 0.2 mg/dL (ref 0.2–1.0)
Calcium, Total: 8.6 mg/dL (ref 8.5–10.1)
Chloride: 109 mmol/L — ABNORMAL HIGH (ref 98–107)
Co2: 29 mmol/L (ref 21–32)
Creatinine: 0.78 mg/dL (ref 0.60–1.30)
EGFR (African American): >60
EGFR (Non-African Amer.): >60
Glucose: 134 mg/dL — ABNORMAL HIGH (ref 65–99)
Osmolality: 288 (ref 275–301)
Potassium: 3.9 mmol/L (ref 3.5–5.1)
SGOT(AST): 46 U/L — ABNORMAL HIGH (ref 15–37)
SGPT (ALT): 46 U/L (ref 12–78)
Sodium: 143 mmol/L (ref 136–145)
Total Protein: 7.6 g/dL (ref 6.4–8.2)

## 2012-10-11 LAB — CBC WITH DIFFERENTIAL/PLATELET
Basophil #: 0 10*3/uL (ref 0.0–0.1)
Basophil %: 0.4 %
Eosinophil #: 0.2 10*3/uL (ref 0.0–0.7)
Eosinophil %: 1.4 %
HCT: 37.6 % (ref 35.0–47.0)
HGB: 12.2 g/dL (ref 12.0–16.0)
Lymphocyte #: 1.6 10*3/uL (ref 1.0–3.6)
Lymphocyte %: 14.7 %
MCH: 31 pg (ref 26.0–34.0)
MCHC: 32.6 g/dL (ref 32.0–36.0)
MCV: 95 fL (ref 80–100)
Monocyte #: 0.4 x10 3/mm (ref 0.2–0.9)
Monocyte %: 3.9 %
Neutrophil #: 8.5 10*3/uL — ABNORMAL HIGH (ref 1.4–6.5)
Neutrophil %: 79.6 %
Platelet: 242 10*3/uL (ref 150–440)
RBC: 3.94 10*6/uL (ref 3.80–5.20)
RDW: 13.7 % (ref 11.5–14.5)
WBC: 10.7 10*3/uL (ref 3.6–11.0)

## 2014-02-14 IMAGING — CT CT HEAD WITHOUT CONTRAST
2 series · 15 of 30 positions shown, 19 images · non-contrast
Comparison: none

REASON FOR EXAM: vomiting, brain tumor removal 6 weeks ago
COMMENTS:

[Series 2: without · axial · non-contrast · 0.42mm/px · z∈[-656,-526]mm · 13 of 32 slices shown, 17 images]
[im 3/32  brain]
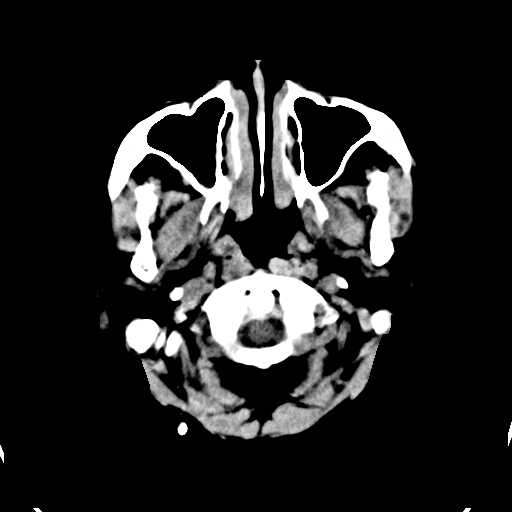
[im 3/32  bone]
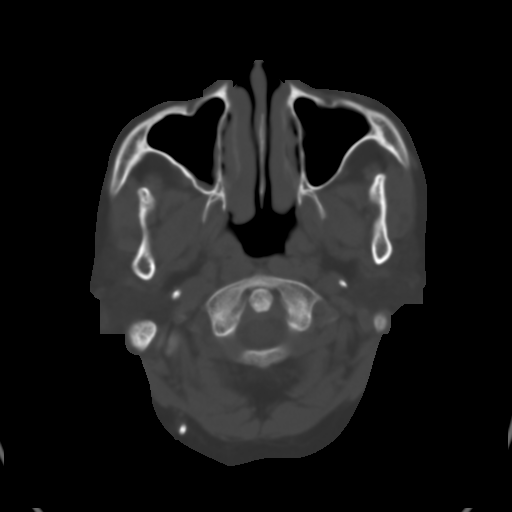
[im 5/32  brain]
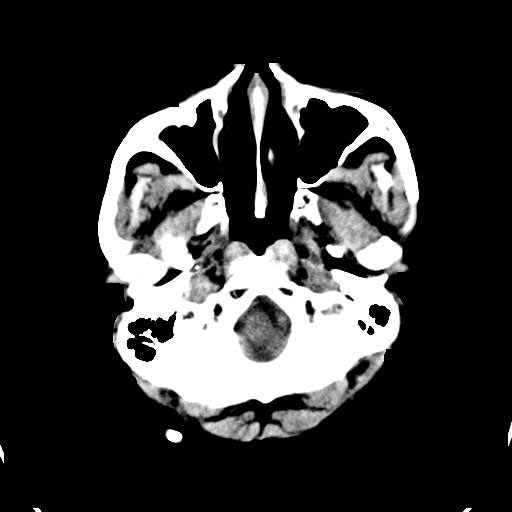
[im 7/32  brain]
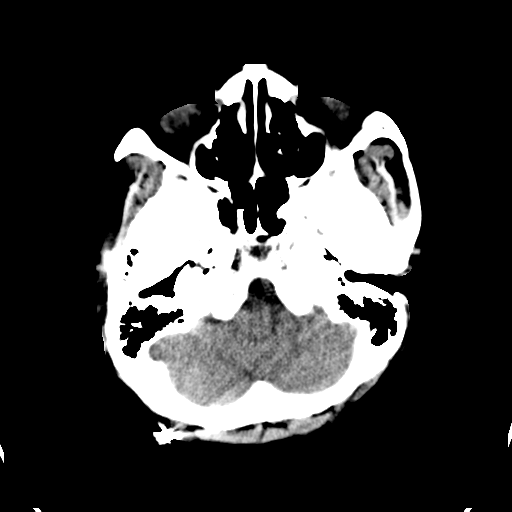
[im 9/32  brain]
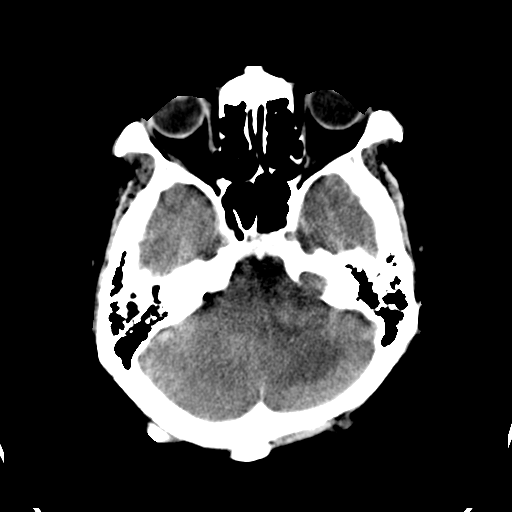
[im 12/32  brain]
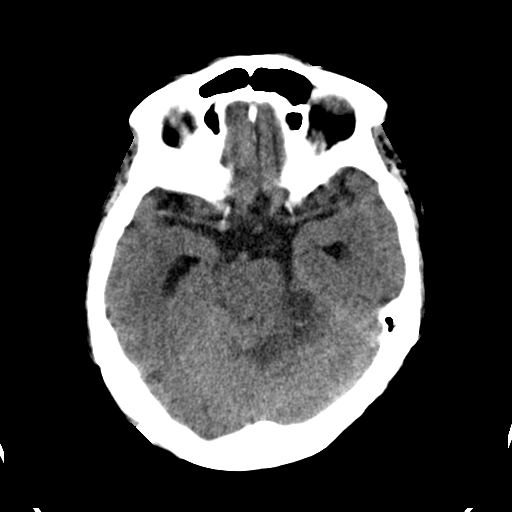
[im 12/32  bone]
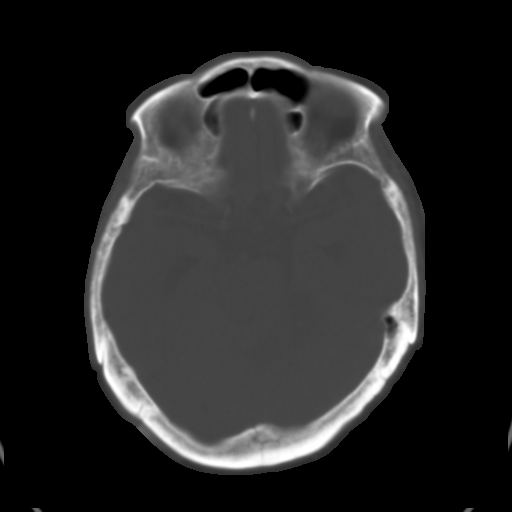
[im 14/32  brain]
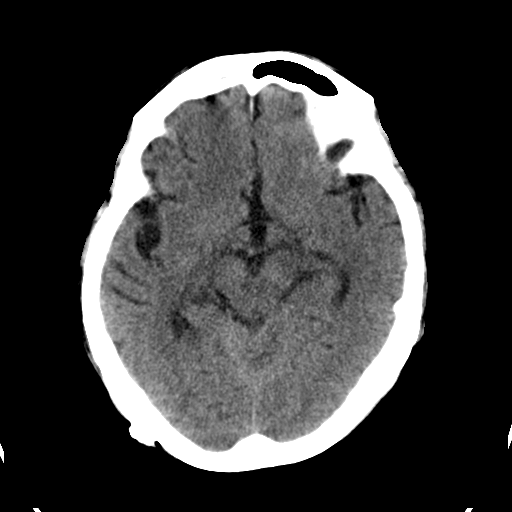
[im 16/32  brain]
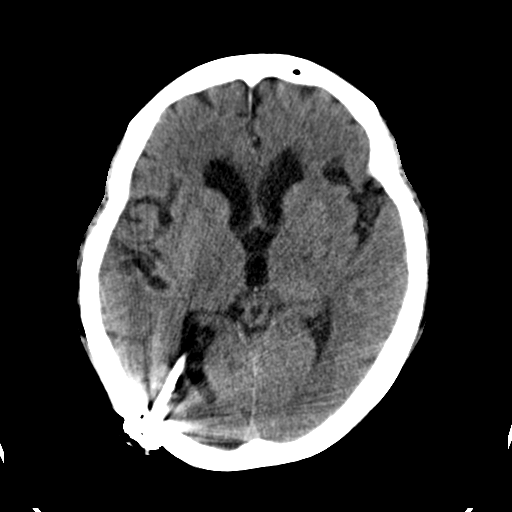
[im 18/32  brain]
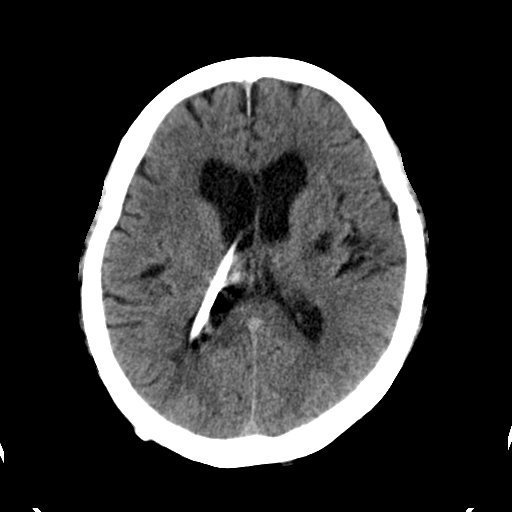
[im 20/32  brain]
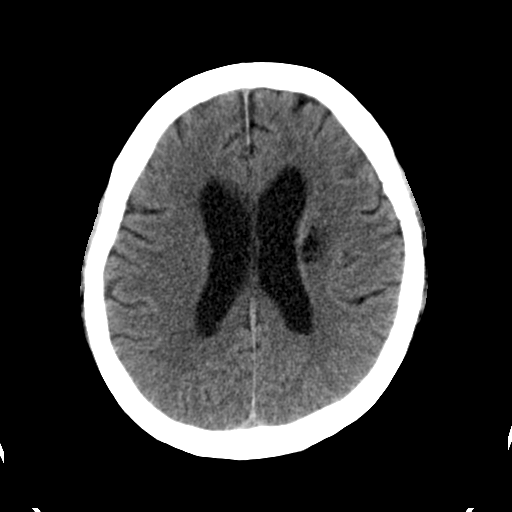
[im 20/32  bone]
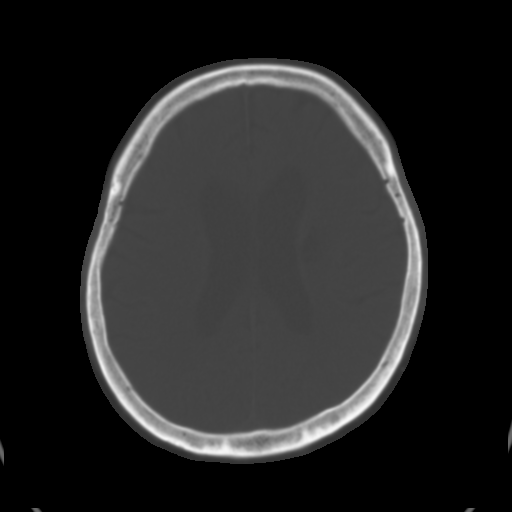
[im 23/32  brain]
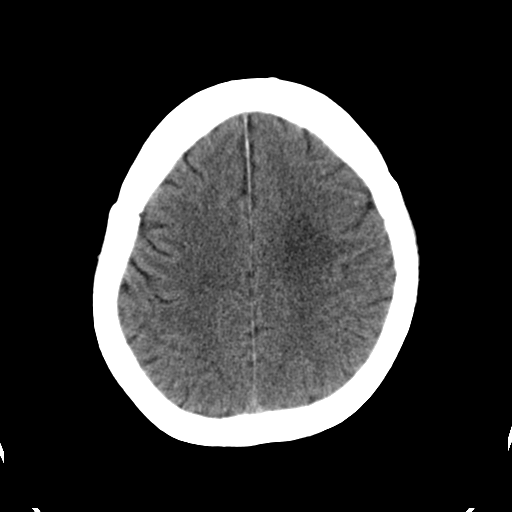
[im 25/32  brain]
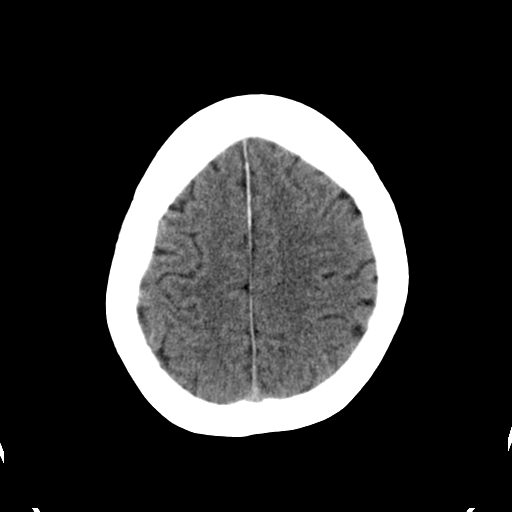
[im 27/32  brain]
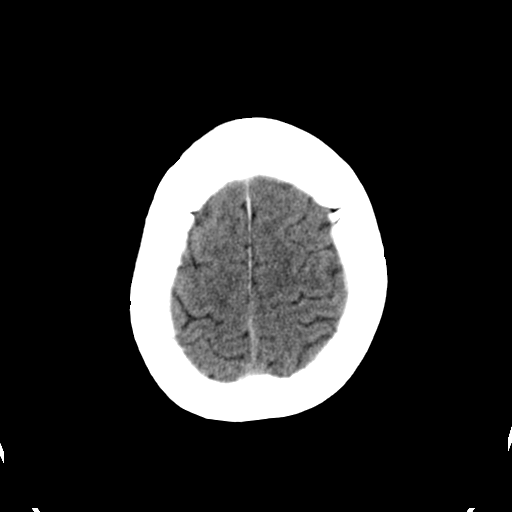
[im 29/32  brain]
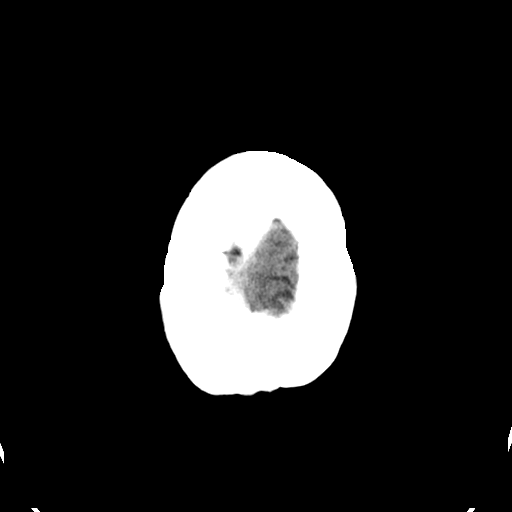
[im 29/32  bone]
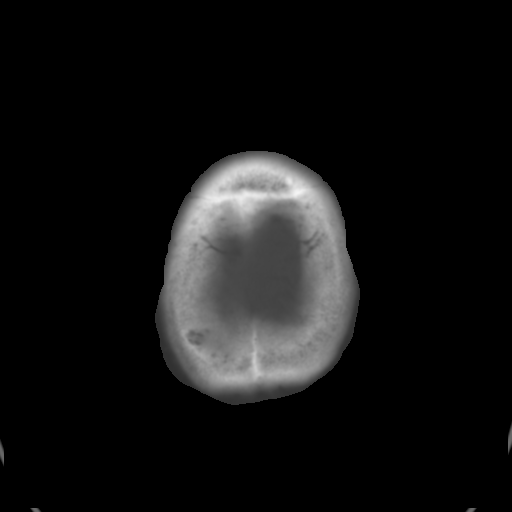

[Series 3: bone · axial · 0.42mm/px · z∈[-656,-636]mm · 2 of 32 slices shown]
[im 3/32  bone]
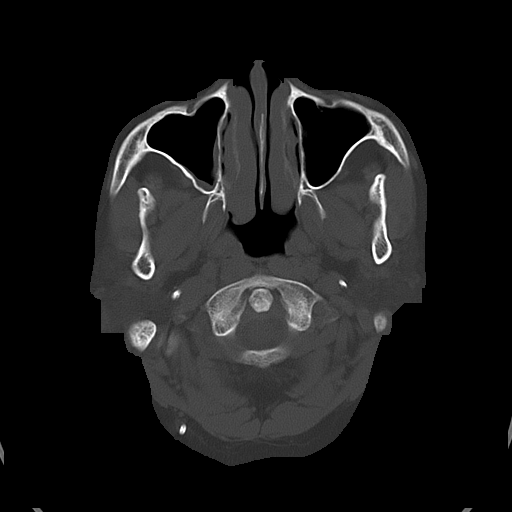
[im 7/32  bone]
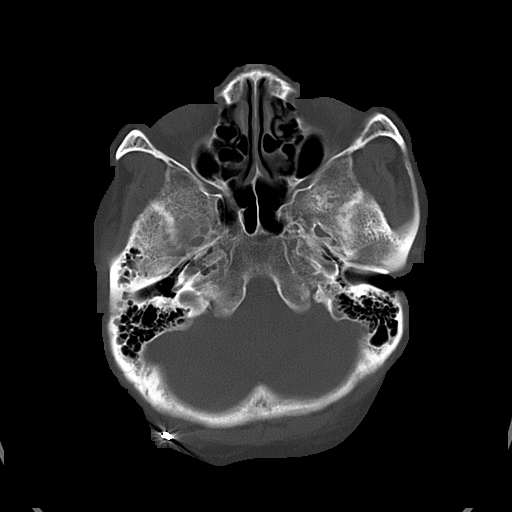

[15 of 30 positions shown; findings below may reference images not displayed]

PROCEDURE:     CT  - CT HEAD WITHOUT CONTRAST  - January 20, 2012 [DATE]

RESULT:     Comparison is made to the previous exam dated July 03, 2010.

Since the previous exam, a ventriculostomy shunt tube has been placed. The
shunt is from a right posterior approach passing through the right lateral
ventricle into the frontal horn of the left lateral ventricle and into the
paramedian periventricular area. There is a large area of low-attenuation
involving the left cerebellar region including the left cerebellopontine
angle. This area measures up to 4.31 x 3.42 cm on image 11 with mass effect
on the adjacent pons and basilar cistern. There is periventricular infarct
on the left in the corona radiata with diffuse low-attenuation in the
periventricular white matter consistent with chronic small vessel ischemic
disease. No acute infarct is evident. There is no intracranial hemorrhage.
There is mucosal thickening in the maxillary sinuses with some mucus
retention cysts inferiorly especially on the right and thickening of the
mucosa in the ethmoid region. The mastoid air cells are clear. The calvarium
appears intact.
IMPRESSION: 1. Cerebellopontine angle mass with mass effect. No hemorrhage evident.
2. Development of a left-sided periventricular infarct in the parietal
region corona radiata.  Followup recommended.

[REDACTED]

## 2014-06-10 ENCOUNTER — Encounter (HOSPITAL_COMMUNITY): Payer: Self-pay | Admitting: Cardiology

## 2014-07-16 IMAGING — CR DG CHEST 1V PORT
1 series · 1 of 1 positions shown · non-contrast
Comparison: none

REASON FOR EXAM: weakness
COMMENTS:

[ap]
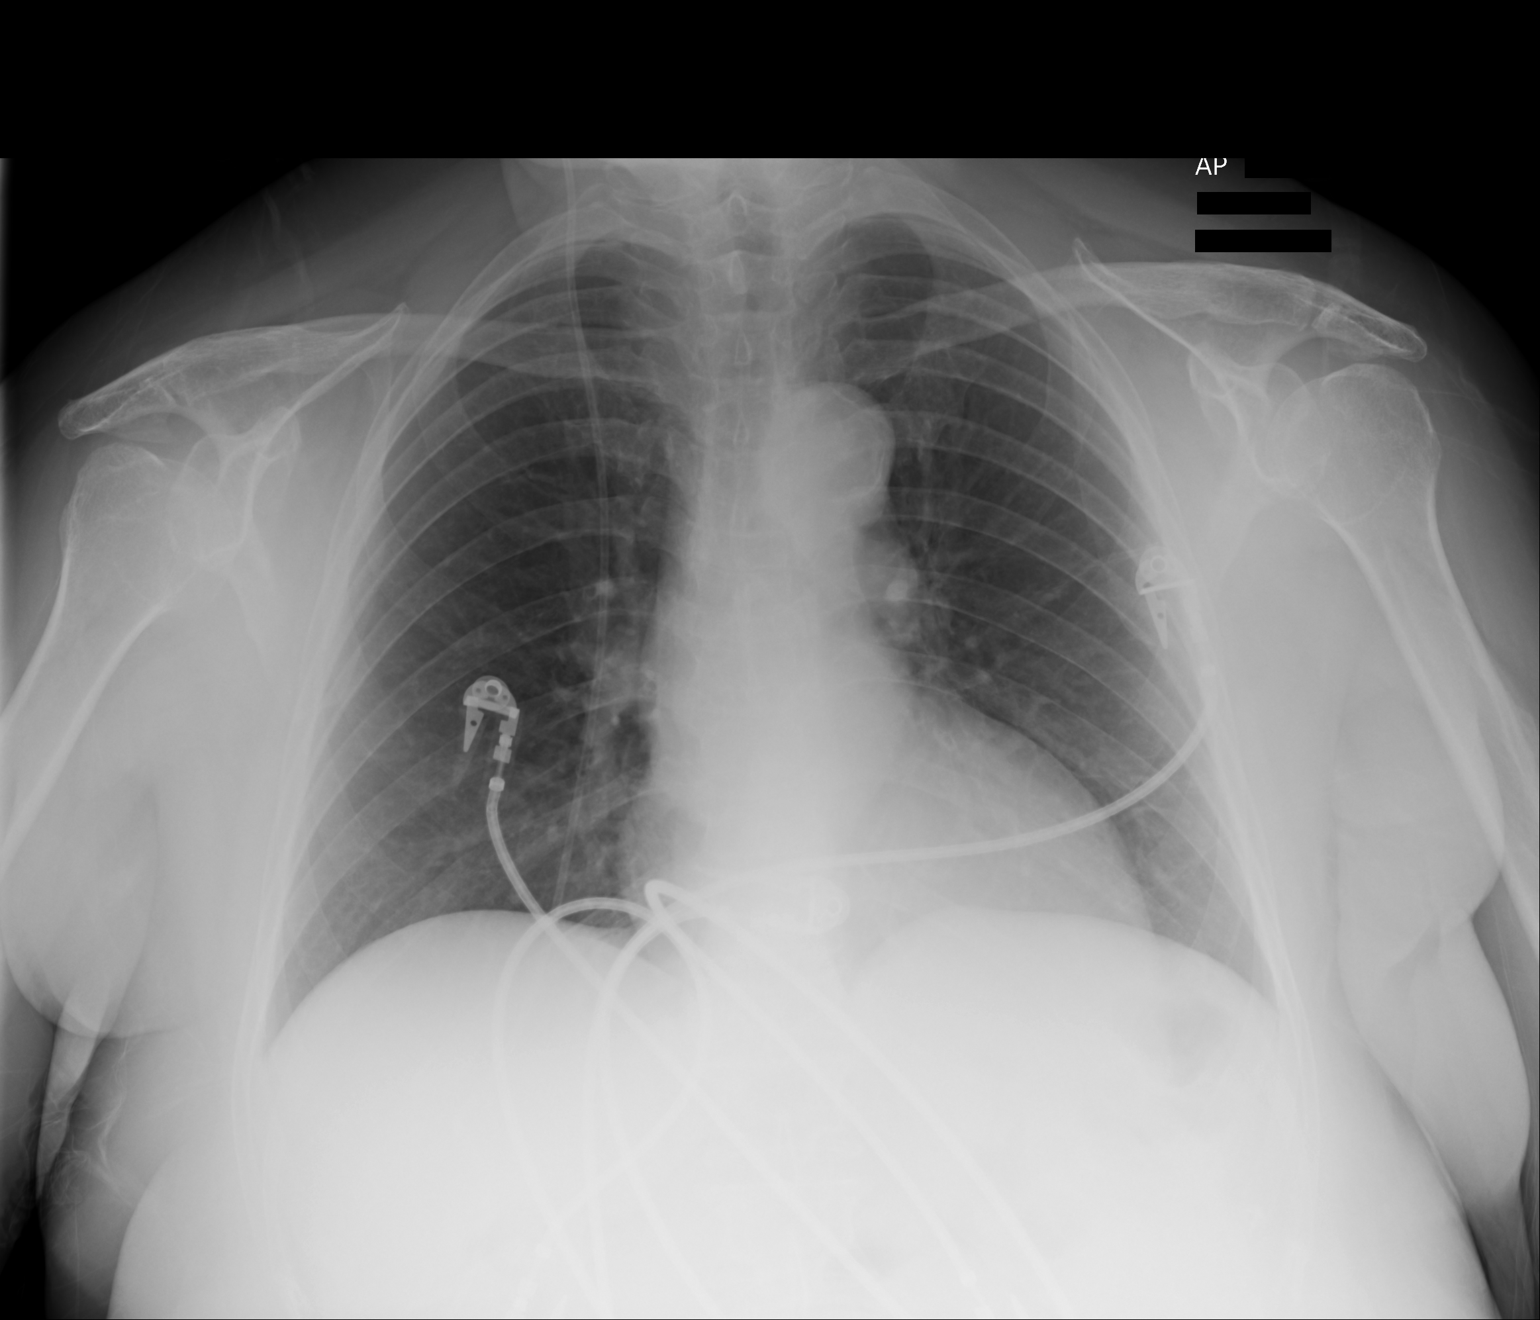

[1 of 1 positions shown; findings below may reference images not displayed]

PROCEDURE:     DXR - DXR PORTABLE CHEST SINGLE VIEW  - June 20, 2012 [DATE]

RESULT:     Comparison is made to the study July 03, 2010.

The lungs are adequately inflated and clear. A ventriculoperitoneal shunt
catheter is in place on the right. The cardiac silhouette is normal in size.
The pulmonary vascularity is not engorged. The mediastinum is normal in
width. There is mild tortuosity of the descending thoracic aorta.
IMPRESSION: There is no evidence of acute cardiopulmonary abnormality.

[REDACTED]

## 2014-10-22 NOTE — Discharge Summary (Signed)
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   OTHER DIAGNOSES: Include coronary artery disease, hypertension, dyslipidemia, history of hydrocephalus, status post ventriculoperitoneal shunt placed on 09/28/2011, and a history of a brain tumor.   DISPOSITION: [facility], emergent transfer.   HISTORY OF PRESENT ILLNESS AND HOSPITAL COURSE: The patient is a very nice 74 year old [place] lady, Vietnamese-speaking only, who comes with a history of chest pain and fatigue for 2 days with some headaches. Most of the interviews were conducted through a speaker phone due to the fact that the patient, or the husband, does not speak any [place]. 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 The family states that she had chest pain, 6 out of 10, increased in the morning with shortness of breath, dizziness and shaking for no reason, for what she came to the Emergency Room.   Five or 6 months ago, the patient was taking to [facility] where she had some testing done and at that moment, she was diagnosed with coronary artery disease. Apparently she had a procedure to, what the family said, unclog her arteries. I have not been able to obtain records from this yet.   The patient was admitted to the floor and yesterday had significant headaches and nausea and vomiting, for what a CT scan was ordered. They were not able to do it due to the fact that if the patient got contrast and if there was any bleeding, it was not going to be detected properly. For that reason, a neurologic exam was done. The patient did not have any neurologic findings. The patient has no changes on her  neurologic exam, for what we decided to wait for the next day for the CT scan. The CT scan was done today and it shows a mass, which is 3.7 cm, and has already cerebral edema and possible progression to herniation. For that, the patient is transferred to [facility]. At this moment, her neurologic exam remains stable. Her blood pressure is 146/85 and her heart rate went up to 130. Her respirations are about 20 and her temperature is 98.2. The patient is having active chest pain, which is likely due to anxiety. Nitroglycerin is given. Morphine has been given. The patient is starting to feel better. There are no signs of respiratory distress and the pain in the chest is completely reproducible to palpation. The patient is very anxious at this moment due to the news of the mass on her brain and for that reason, she is getting tachycardic and is starting to breathe a little bit harder. The patient has pupils that are equal and reactive. Her extraocular movements are intact. Her mucosa is moist. Her tongue is central. Her uvula is central. The sensation on both sides of the face is normal. She states that she can hear on both sides equal. There are no changes in her vision. There is no blurry vision or double vision. Her shoulders are able to be raised without problem against strength. Her strength in 4 extremities is normal.   The case was discussed with Dr. Delilah Shan, neurosurgery, who accepted the patient. We are going to transfer the patient as soon as possible to  [place]: She is on meclizine 25 mg 3 times a day, nitroglycerin patch 0.5 mg twice a day, Protonix 40 mg once a day, Crestor 40 mg once a day, aspirin 81 mg once a day, Plavix 75 mg once daily, dexamethasone 10 mg every 4 hours. She has not received her first dose of dexamethasone. The patient received the Plavix and the aspirin this morning.    ____________________________ [place] Sink, MD rsg:jm D: 06/21/2012  19:07:36 ET T: 06/21/2012 20:05:46 ET JOB#: [id number]  cc: Ulysses Sink, MD, <Dictator> Ambreen Tufte America Brown MD ELECTRONICALLY SIGNED 07/03/2012 7:33

## 2014-10-22 NOTE — H&P (Signed)
PATIENT NAMELATIKA, [name] MR#:  [id number] DATE OF BIRTH:  1940/12/19  DATE OF ADMISSION:  06/20/2012  ADDEMDUM:   I received the results of the CT scan showing dependent atelectasis in both lungs, no significant pleural or pericardial effusions. No thoracic aortic aneurysm or pulmonary embolism. There is a mild compression deformity of V94 which is of uncertain chronicity which could be related to the pain. The patient was tender to palpation at the level of the T spine and mobilization as well. She also has an enhancement of the thyroid gland with the right thyroid gland larger than the left. We are going to check a TSH, follow up results. There is a hepatic lobe cyst which looks benign.   PROBLEM LIST ADDENDUM:  1. Possible thyroid nodule.  2. Compression fracture of T12.   ____________________________ [place] Sink, MD rsg:cb D: 06/20/2012 16:19:23 ET T: 06/20/2012 17:25:25 ET JOB#: [id number]  cc: [place] Sink, MD, <Dictator> Denetta Fei America Brown MD ELECTRONICALLY SIGNED 07/03/2012 7:31

## 2014-10-22 NOTE — H&P (Signed)
PATIENT NAMEIYANNA, [name] MR#:  [id number] DATE OF BIRTH:  09-Jul-1940  DATE OF ADMISSION:  06/20/2012  PRIMARY CARE PHYSICIAN: Kylie Stabile, MD   REFERRING PHYSICIAN: Arman Filter, MD  CARDIOLOGIST: [facility] [place]  HISTORY OF PRESENT ILLNESS: This is a 74 year old [place] lady, Vietnamese-speaking, no English, who comes with a history of chest pain, headache and fatigue for the last two days. The interview was conducted through a speakerphone with an interpreter and despite that the patient and the patient's husband are really poor historians and that could be related to location, cultural or the stress of the situation. The patient comes over here very anxious. They state that the pain started two days ago and this has been increasing all the way to 6 out of 10 this morning associated with shortness of breath, dizziness and shaking and for that reason they decided to come to the Emergency Room. Five or six months ago, the patient was taken by ambulance to [facility] where she had some testing done and she was put on a lot of medications for coronary artery disease. I have a little discharge summary and mostly the information for the patient and the diagnosis at discharge were CAD, hyperlipidemia and hydrocephalus. As well, we can deduct that she has high blood pressure due to the elevated blood pressures here. The patient has had a big work-up before and today we are going to admit her for another chest pain work-up. The last time that she was at [facility], the ambulance decided to take her there, but the patient cannot tell me why. The husband says that it is because she was really tired.   REVIEW OF SYSTEMS: Unable to obtain a good and detailed review of systems due to difficulty in communicating.  PAST MEDICAL HISTORY: 1. Coronary artery disease.  2. Hypertension.  3. Dyslipidemia.  4. Hydrocephalus.  5. History of brain tumor.   ALLERGIES: No known drug allergies.   PAST  SURGICAL HISTORY: The patient had a brain tumor removed at [facility] and BP shunt placement.   FAMILY HISTORY: Denies any history of heart attacks, cancer, diabetes or high blood pressure.   SOCIAL HISTORY: The patient lives with her husband. She is retiring. They could not tell me what was her profession before. 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 She does not smoke and never has. She does not drink.   MEDICATIONS: 1. Aspirin 81 mg daily. 2. Plavix 75 mg daily with breakfast. 3. Colace 100 mg once a day. 4. Metoprolol 25 mg by mouth, take two 2 times daily. 5. Nitroglycerin p.r.n. chest pain.  6. Polyethylene glycol.  7. Crestor 40 mg once a day.  8. Tramadol 50 mg every 6 hours p.r.n.   PHYSICAL EXAMINATION:  VITAL SIGNS: Blood pressure 170/83, it has been as high as 190s/120s, pulse 58, respirations 18 and afebrile.   GENERAL: The patient is alert and apparently looks oriented x 3, as per the questions asked through the interpreter and husband. No acute distress. Her pain is resolved.   HEENT: Pupils are equal and reactive. Extraocular movements are intact. Mucosa is dry. No oropharyngeal lesions. Anicteric sclerae. Pink conjunctivae.   NECK: Supple. No JVD. No thyromegaly. No adenopathy. No carotid bruits. No pain in the neck. There is a tract from her BP shunt that seems intact coming down from her right side down to her right upper  quadrant.   CARDIOVASCULAR: Regular rate and rhythm. No murmurs, rubs or gallops. No displacement of PMI. Positive significant tenderness to palpation of the chest wall anteriorly. Pain is reproducible.  PULMONARY: Lungs are clear without any wheezing or crepitus. No dullness to percussion. No use of accessory muscles.   ABDOMEN: Soft, nontender and nondistended. No hepatosplenomegaly. No masses. Bowel sounds are positive.   EXTREMITIES: No edema, no cyanosis and no clubbing. Pulses  +2. Capillary refill less than 3.   GENITAL: Deferred.   NEUROLOGIC: Cranial nerves II through XII intact. Strength is five out of five in all 4 extremities. No focal deficits.   PSYCHIATRIC: Mood is flat affect. No significant agitation, but the patient was very anxious when she arrived over here.   SKIN: No rashes or petechiae.   LYMPHATICS: Negative for lymphadenopathy in neck or supraclavicular areas.   MUSCULOSKELETAL: No significant joint deformity. Positive tenderness to palpation of the chest wall.  RESULTS: Her labs are overall unremarkable with sodium of 144, potassium 4.0, creatinine 0.72 and BUN 12. LFTs are normal. Troponin is negative. Her white blood cells are 6.1, hemoglobin is 13.7 and platelets 176.   Urinalysis is within normal limits.   Chest x-ray:  No acute changes.   EKG: Normal sinus rhythm. No acute changes in the ST segment or T wave.     ASSESSMENT AND PLAN:  1. This is a 74 year old female with history of apparently coronary artery disease recently diagnosed at [facility]. Records are going to be ordered. The patient's case was discussed with Dr. Fletcher [name] by Dr. Renard [name]. He just wants to admit her for a work-up to rule out coronary artery disease. The patient has a tortuous aorta on her chest x-ray and she has significant elevation of her blood pressure with malignant hypertension for what I think she will benefit from getting a CT scan of the chest to rule out dissection. I am going to order that today. Cardiac enzymes are going to be checked. Myoview test is going to be ordered as well. Nitroglycerin is going to be given to also help with the blood pressure, aspirin and continue statin.  2. Hypertension. I added hydralazine. Continue nitroglycerin. The patient is already on a beta blocker and she is having heart rate of 58.  3. 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 Records to be requested from [facility].  4. Deep vein thrombosis prophylaxis with Lovenox.  CODE STATUS: FULL  CODE.  TIME SPENT: I spent about 50 minutes with this patient.  ____________________________ Arnold Sink, MD rsg:sb D: 06/20/2012 14:24:16 ET T: 06/20/2012 14:53:54 ET JOB#: [id number]  cc: [place] Sink, MD, <Dictator> Roman Dubuc America Brown MD ELECTRONICALLY SIGNED 07/03/2012 7:31

## 2014-10-22 NOTE — Discharge Summary (Signed)
PATIENT NAMESHANTANA, [name] MR#:  [id number] DATE OF BIRTH:  06-04-1941  DATE OF ADMISSION:  08/15/2012  DATE OF DISCHARGE:  08/16/2012   ADMITTING PHYSICIAN:  Dr. Darvin Neighbours.   DISCHARGING PHYSICIAN:   Gladstone Lighter.  PRIMARY CARE PHYSICIAN:  Dr. Benita Stabile.   PRIMARY NEUROSURGEON:  At [facility].   PRIMARY CARDIOLOGIST:  Also at [facility].   DISCHARGE DIAGNOSES: 1.  Syncope, likely vasovagal.  2.  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  3.  Coronary artery disease, status post recent cardiac catheterization on 08/02/2012, at [facility].  4.  Hypertension.  5.  Insomnia. 6.  Urinary tract infection.  DISCHARGE HOME MEDICATIONS: 1.  Sublingual nitroglycerin 0.4 mg tablet every 5 minutes p.r.n. for chest pain.  2.  Crestor 40 mg p.o. daily.  3.  Aspirin 81 mg p.o. daily.  4.  Plavix 75 mg p.o. daily.  5.  Metoprolol 25 mg p.o. b.i.d.  6.  Vicodin 5/500, 1 tablet every 12 hours as needed for pain.  7.  Tramadol 50 mg q.6 hours p.r.n. for pain.  8.  Lisinopril 20 mg p.o. daily.  9.  Trazodone 100 mg at bedtime.  10.  Meclizine 25 mg 3 times a day as needed for vertigo. 11.  Ambien 5 mg p.o. at bedtime as needed for sleep.   DISCHARGE DIET:  Low sodium diet.   HOME OXYGEN:  None.   DISCHARGE ACTIVITY:  As tolerated.    FOLLOWUP INSTRUCTIONS: 1.  Follow up with neurosurgeon at [facility] in 2 weeks.  2.  PCP followup in 1 to 2 weeks.  3.  Home health nursing and nursing aide.   LABS AND IMAGING STUDIES:  Urinalysis positive for an infection. Urine cultures were growing gram-negative rod.    WBC 8.6, hemoglobin 12.0, hematocrit 36.4, platelet count 198.   Sodium 146, potassium 4.1, chloride 111, bicarb 30, BUN 11, creatinine 0.80, glucose 106 and calcium of 9.0.   ALT 22, AST 27, alk phos 55, total bili 0.5, albumin of 3.9. Troponins were negative in the hospital.   CT of the  head without contrast showed large complex cystic mass, left cerebellopontine angle with compression of fourth ventricle and brainstem and midline shift from left-to-right of 6 mm. Ventriculostomy tube noted in good position. Chest x-ray showing borderline cardiomegaly. No acute abnormalities.   BRIEF HOSPITAL COURSE:  The patient is a 74 year old [place] female who speaks [place], who speaks [place]  and most of the communication was done by an Astronomer. The patient does have left cerebellopontine angle large cystic mass for which she has been following at [facility] neurosurgery. She had a shunt put in in April 2013, and had a recent hospitalization here in [place] in December 2013, and she was transferred to [facility], as the site of the mass was felt to be increased. However, according to family, no intervention was done, and she was given a followup appointment in April 2014.  She has been having some dizziness and also some left arm jerks for the last several months. However, she went to the bathroom and passed out and was brought to the hospital this time.  1.  Syncope, appears vasovagal based on the description; however, her underlying most of her symptoms were probably related to her tumor, and she needs followup at [facility] neurosurgery. Her tumor appears large and has some pressure effect, however, that seems chronic, based on her previous  CT. She was monitored on tele to rule out cardiogenic cause of her syncope. No arrhythmias were found, and her dizziness has improved, though she still has chronic dizziness, and she is being discharged in a stable condition.  2.  Hypertension. Lisinopril was added, and she was already on metoprolol.  3.  Coronary artery disease, appears stable at this time. Again, no records were available from [facility] though requested, and she appears stable, so is being discharged home on home medications.   4.  Urinary tract infection. She did not have any dysuria;  however, urine cultures were sent and now they are going gram-negative rods, so a prescription for Levaquin will be prescribed to patient, and the nurses will call the patient for the prescription. Her course has been otherwise uneventful in the hospital.   DISCHARGE CONDITION:  Stable.   DISCHARGE DISPOSITION:  Home with home health nursing and nurse aide.    Time spent on discharge is 45 minutes.     ____________________________ Gladstone Lighter, MD rk:dm D: 08/17/2012 15:34:00 ET T: 08/18/2012 09:48:13 ET JOB#: [id number]  cc: Gladstone Lighter, MD, <Dictator> Gladstone Lighter MD ELECTRONICALLY SIGNED 08/27/2012 15:59

## 2014-10-22 NOTE — H&P (Signed)
PATIENT NAMECALA, [name] MR#:  [id number] DATE OF BIRTH:  03/03/1941  DATE OF ADMISSION:  08/15/2012  PRIMARY CARE PHYSICIAN: Benita Stabile, MD  CHIEF COMPLAINT: Syncope.   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 The patient went to the restroom, then got up, tried to stand up, and had an episode of syncope, passed out for about 10 minutes. History obtained from nursing staff, ER doctor, Dr. Dahlia Client, and family at bedside, along with the patient. An interpreter over the phone was not available, in the Emergency Room, and the patient's granddaughter interpreted the history.   She does have on and off dizziness since her decompression surgery, for the right cerebellopontine angle tumor, in December of 2013, at [facility]. She does have some nausea. No vomiting. No chest pain, shortness of breath, rash, fever.   CT of the head done today in the Emergency Room shows stable right cerebellopontine angle tumor status post decompression surgery with no edema, hydrocephalus or mass effect.   PAST MEDICAL HISTORY: 1. Coronary artery disease with recent cath on Feb 1st at [facility].  2. Hypertension.  3. Dyslipidemia.  4. Right cerebellopontine angle tumor.   PAST SURGICAL HISTORY: Right cerebellopontine angle tumor with shunt placement.   ALLERGIES: No known drug allergies.   FAMILY HISTORY: No cancer, diabetes or hypertension.   SOCIAL HISTORY: The patient lives with her husband. She is retired. Does not speak [place], communicates in [place]. Does not smoke. No alcohol.   MEDICATIONS: 1. Aspirin 81 mg oral daily.  2. Plavix 75 mg oral daily.  3. Metoprolol 25 mg by mouth twice a day.  4. Nitroglycerin p.r.n. for chest pain,  0.4 mg.  5. Crestor 40 mg daily.  6. Tramadol 50 mg every 6 hours as needed.   PHYSICAL EXAMINATION: VITAL SIGNS: Blood pressure 169/139, pulse 59, respirations 18, saturating 96% on room air.  GENERAL: Elderly female patient lying in bed. Anxious. Is alert and oriented to person, place and time.  HEENT: Atraumatic, normocephalic. Oral mucosa moist and pink. External ears and nose normal. No pallor. No icterus. Pupils bilaterally equal and reactive to light.  NECK: Supple. No thyromegaly. No palpable lymph nodes. Trachea midline. No carotid bruits or JVD.  CARDIOVASCULAR: S1 and S2, regular rate and rhythm. Peripheral pulses 2+.  RESPIRATORY: Normal work of breathing. Clear to auscultation on both sides.  GASTROINTESTINAL: Soft abdomen, nontender. Bowel sounds present. No hepatosplenomegaly palpable.  SKIN: Warm and dry. No petechiae, rash or ulcers.  MUSCULOSKELETAL: No joint swelling, redness or effusion of the large joints. Normal muscle tone.  NEUROLOGICAL: Motor strength 5 out of 5 in upper and lower extremities. Sensation to fine touch intact all over.  LYMPHATIC: No cervical lymphadenopathy.   LAB STUDIES:  Show glucose of 106, BUN 11, creatinine 0.80, sodium 146, potassium 4.1, chloride 111, bicarbonate of 30, AST, ALT, alkaline phosphatase and bilirubin normal. CK of 93. CK-MB less than 0.5. Troponin 0.04. WBC 8.6, hemoglobin 12 and platelets of 198. Urinalysis shows 4 WBC and trace bacteria.   DIAGNOSTICS: EKG shows Q waves in leads III and aVF with T wave inversions in inferolateral leads. EKG seems unchanged from the prior EKG.   CT scan of the head shows right cerebellopontine angle tumor, stable.   ASSESSMENT AND PLAN: 1. Syncope.  This is likely secondary from the cerebellopontine angle tumor. The patient does have element of dizziness but never had syncope, which this is the first episode. Orthostats have been negative in the ER. We will get 2 more sets of cardiac enzymes, get  an echocardiogram. Recent cath. We will try to rule out cardiac causes. The patient will be on the tele floor. Follow CK and troponin.  2. Hypertensive urgency. The patient is on metoprolol. We will add lisinopril, use IV medications.  3. Right cerebellopontine angle tumor, status post surgery at [facility]. We will request records from the recent admission at [facility]. CT scan seems stable. If the patient has any further syncopal episodes or neurological deficits, we will have to repeat CT of the head.  4. Coronary artery disease. Recent cath results unknown. Request records from [place]. Troponin normal. No chest pain. Continue aspirin, Plavix, beta blocker and a statin.  5. Deep vein thrombosis prophylaxis with SCDs and ambulation. Can start on heparin products once records from [place] are available regarding recent brain surgery, if any was done.    CODE STATUS: FULL CODE.   TIME SPENT: Today on this case was 65 minutes. ____________________________ Leia Alf Guenther Dunshee, MD srs:sb D: 08/15/2012 06:26:11 ET T: 08/15/2012 07:11:11 ET JOB#: [id number]  cc: Alveta Heimlich R. Justen Fonda, MD, <Dictator> Leona Carry. Hall Busing, MD Neita Carp MD ELECTRONICALLY SIGNED 08/16/2012 15:18

## 2018-07-15 ENCOUNTER — Encounter: Payer: Self-pay | Admitting: Emergency Medicine

## 2018-07-15 ENCOUNTER — Emergency Department: Payer: Medicare HMO

## 2018-07-15 ENCOUNTER — Other Ambulatory Visit: Payer: Self-pay

## 2018-07-15 ENCOUNTER — Inpatient Hospital Stay
Admit: 2018-07-15 | Discharge: 2018-07-15 | Disposition: A | Discharge status: Home / Self Care | Payer: Medicare HMO | Attending: Internal Medicine | Admitting: Internal Medicine

## 2018-07-15 ENCOUNTER — Inpatient Hospital Stay
Admission: EM | Admit: 2018-07-15 | Discharge: 2018-07-17 | DRG: 281 | Disposition: A | Discharge status: Home / Self Care | Payer: Medicare HMO | Attending: Internal Medicine | Admitting: Internal Medicine

## 2018-07-15 DIAGNOSIS — Z8673 Personal history of transient ischemic attack (TIA), and cerebral infarction without residual deficits: Secondary | ICD-10-CM | POA: Diagnosis not present

## 2018-07-15 DIAGNOSIS — Z79899 Other long term (current) drug therapy: Secondary | ICD-10-CM

## 2018-07-15 DIAGNOSIS — Z982 Presence of cerebrospinal fluid drainage device: Secondary | ICD-10-CM

## 2018-07-15 DIAGNOSIS — I252 Old myocardial infarction: Secondary | ICD-10-CM | POA: Diagnosis not present

## 2018-07-15 DIAGNOSIS — I160 Hypertensive urgency: Secondary | ICD-10-CM | POA: Diagnosis present

## 2018-07-15 DIAGNOSIS — I2510 Atherosclerotic heart disease of native coronary artery without angina pectoris: Secondary | ICD-10-CM | POA: Diagnosis present

## 2018-07-15 DIAGNOSIS — G919 Hydrocephalus, unspecified: Secondary | ICD-10-CM | POA: Diagnosis present

## 2018-07-15 DIAGNOSIS — I214 Non-ST elevation (NSTEMI) myocardial infarction: Principal | ICD-10-CM | POA: Diagnosis present

## 2018-07-15 DIAGNOSIS — I10 Essential (primary) hypertension: Secondary | ICD-10-CM | POA: Diagnosis present

## 2018-07-15 DIAGNOSIS — Z955 Presence of coronary angioplasty implant and graft: Secondary | ICD-10-CM

## 2018-07-15 DIAGNOSIS — Z86018 Personal history of other benign neoplasm: Secondary | ICD-10-CM | POA: Diagnosis not present

## 2018-07-15 DIAGNOSIS — R778 Other specified abnormalities of plasma proteins: Secondary | ICD-10-CM

## 2018-07-15 DIAGNOSIS — Z7902 Long term (current) use of antithrombotics/antiplatelets: Secondary | ICD-10-CM | POA: Diagnosis not present

## 2018-07-15 DIAGNOSIS — Z7982 Long term (current) use of aspirin: Secondary | ICD-10-CM

## 2018-07-15 DIAGNOSIS — E785 Hyperlipidemia, unspecified: Secondary | ICD-10-CM | POA: Diagnosis present

## 2018-07-15 DIAGNOSIS — R001 Bradycardia, unspecified: Secondary | ICD-10-CM | POA: Diagnosis present

## 2018-07-15 DIAGNOSIS — R079 Chest pain, unspecified: Secondary | ICD-10-CM

## 2018-07-15 DIAGNOSIS — R7989 Other specified abnormal findings of blood chemistry: Secondary | ICD-10-CM

## 2018-07-15 HISTORY — DX: Unspecified asthma, uncomplicated: J45.909

## 2018-07-15 LAB — LIPID PANEL
Cholesterol: 170 mg/dL (ref 0–200)
HDL: 46 mg/dL (ref >40)
LDL Cholesterol: 102 mg/dL — ABNORMAL HIGH (ref 0–99)
Total CHOL/HDL Ratio: 3.7 RATIO
Triglycerides: 112 mg/dL (ref <150)
VLDL: 22 mg/dL (ref 0–40)

## 2018-07-15 LAB — CBC
HCT: 43.3 % (ref 36.0–46.0)
Hemoglobin: 13.9 g/dL (ref 12.0–15.0)
MCH: 30.3 pg (ref 26.0–34.0)
MCHC: 32.1 g/dL (ref 30.0–36.0)
MCV: 94.3 fL (ref 80.0–100.0)
Platelets: 211 10*3/uL (ref 150–400)
RBC: 4.59 MIL/uL (ref 3.87–5.11)
RDW: 12.2 % (ref 11.5–15.5)
WBC: 8.4 10*3/uL (ref 4.0–10.5)
nRBC: 0 % (ref 0.0–0.2)

## 2018-07-15 LAB — TROPONIN I
Troponin I: 0.29 ng/mL (ref <0.03)
Troponin I: 0.46 ng/mL (ref <0.03)

## 2018-07-15 LAB — PROTIME-INR
INR: 0.88
Prothrombin Time: 11.9 seconds (ref 11.4–15.2)

## 2018-07-15 LAB — HEMOGLOBIN A1C
Hgb A1c MFr Bld: 5.8 % — ABNORMAL HIGH (ref 4.8–5.6)
Mean Plasma Glucose: 119.76 mg/dL

## 2018-07-15 LAB — BASIC METABOLIC PANEL
Anion gap: 8 (ref 5–15)
BUN: 17 mg/dL (ref 8–23)
CO2: 22 mmol/L (ref 22–32)
Calcium: 8.1 mg/dL — ABNORMAL LOW (ref 8.9–10.3)
Chloride: 108 mmol/L (ref 98–111)
Creatinine, Ser: 0.79 mg/dL (ref 0.44–1.00)
GFR calc Af Amer: >60 mL/min (ref >60)
GFR calc non Af Amer: >60 mL/min (ref >60)
Glucose, Bld: 114 mg/dL — ABNORMAL HIGH (ref 70–99)
Potassium: 4.3 mmol/L (ref 3.5–5.1)
Sodium: 138 mmol/L (ref 135–145)

## 2018-07-15 LAB — HEPARIN LEVEL (UNFRACTIONATED): Heparin Unfractionated: <0.1 IU/mL — ABNORMAL LOW (ref 0.30–0.70)

## 2018-07-15 LAB — APTT: aPTT: 26 seconds (ref 24–36)

## 2018-07-15 LAB — TSH: TSH: 1.386 u[IU]/mL (ref 0.350–4.500)

## 2018-07-15 MED ORDER — ASPIRIN 81 MG PO CHEW
324.0000 mg | CHEWABLE_TABLET | Freq: Once | ORAL | Status: AC
  Administered 2018-07-15: 324 mg via ORAL
  Filled 2018-07-15: qty 4

## 2018-07-15 MED ORDER — ASPIRIN EC 81 MG PO TBEC
81.0000 mg | DELAYED_RELEASE_TABLET | Freq: Every day | ORAL | Status: DC
  Administered 2018-07-16 – 2018-07-17 (×2): 81 mg via ORAL
  Filled 2018-07-15 (×2): qty 1

## 2018-07-15 MED ORDER — CLOPIDOGREL BISULFATE 75 MG PO TABS
75.0000 mg | ORAL_TABLET | Freq: Every day | ORAL | Status: DC
  Administered 2018-07-16 – 2018-07-17 (×2): 75 mg via ORAL
  Filled 2018-07-15 (×2): qty 1

## 2018-07-15 MED ORDER — ONDANSETRON HCL 4 MG/2ML IJ SOLN: 4.0000 mg | Freq: Four times a day (QID) | INTRAMUSCULAR | Status: DC | PRN

## 2018-07-15 MED ORDER — HEPARIN (PORCINE) 25000 UT/250ML-% IV SOLN
600.0000 [IU]/h | INTRAVENOUS | Status: DC
  Administered 2018-07-15: 600 [IU]/h via INTRAVENOUS
  Filled 2018-07-15: qty 250

## 2018-07-15 MED ORDER — IOHEXOL 350 MG/ML SOLN
75.0000 mL | Freq: Once | INTRAVENOUS | Status: AC | PRN
  Administered 2018-07-15: 75 mL via INTRAVENOUS

## 2018-07-15 MED ORDER — ACETAMINOPHEN 325 MG PO TABS: 650.0000 mg | ORAL_TABLET | Freq: Four times a day (QID) | ORAL | Status: DC | PRN

## 2018-07-15 MED ORDER — HEPARIN BOLUS VIA INFUSION
3150.0000 [IU] | Freq: Once | INTRAVENOUS | Status: AC
  Administered 2018-07-15: 3150 [IU] via INTRAVENOUS
  Filled 2018-07-15: qty 3150

## 2018-07-15 MED ORDER — FENTANYL CITRATE (PF) 100 MCG/2ML IJ SOLN
25.0000 ug | INTRAMUSCULAR | Status: DC | PRN
  Administered 2018-07-15: 25 ug via INTRAVENOUS
  Filled 2018-07-15: qty 2

## 2018-07-15 MED ORDER — ATORVASTATIN CALCIUM 20 MG PO TABS
40.0000 mg | ORAL_TABLET | Freq: Every day | ORAL | Status: DC
  Administered 2018-07-15 – 2018-07-16 (×2): 40 mg via ORAL
  Filled 2018-07-15 (×2): qty 2

## 2018-07-15 MED ORDER — ACETAMINOPHEN 650 MG RE SUPP: 650.0000 mg | Freq: Four times a day (QID) | RECTAL | Status: DC | PRN

## 2018-07-15 MED ORDER — METOPROLOL TARTRATE 25 MG PO TABS
25.0000 mg | ORAL_TABLET | Freq: Two times a day (BID) | ORAL | Status: DC
  Administered 2018-07-16 – 2018-07-17 (×3): 25 mg via ORAL
  Filled 2018-07-15 (×4): qty 1

## 2018-07-15 MED ORDER — HYDRALAZINE HCL 20 MG/ML IJ SOLN: 10.0000 mg | Freq: Four times a day (QID) | INTRAMUSCULAR | Status: DC | PRN

## 2018-07-15 MED ORDER — NITROGLYCERIN 2 % TD OINT
0.5000 [in_us] | TOPICAL_OINTMENT | Freq: Once | TRANSDERMAL | Status: AC
  Administered 2018-07-15: 0.5 [in_us] via TOPICAL
  Filled 2018-07-15: qty 1

## 2018-07-15 MED ORDER — SODIUM CHLORIDE 0.9 % IV SOLN
INTRAVENOUS | Status: DC
  Administered 2018-07-15 – 2018-07-17 (×2): via INTRAVENOUS

## 2018-07-15 MED ORDER — ONDANSETRON HCL 4 MG PO TABS: 4.0000 mg | ORAL_TABLET | Freq: Four times a day (QID) | ORAL | Status: DC | PRN

## 2018-07-15 NOTE — Consult Note (Signed)
ANTICOAGULATION CONSULT NOTE - Initial Consult  Pharmacy Consult for heparin infusion Indication: chest pain/ACS  No Known Allergies  Patient Measurements: Height: 5' (152.4 cm) Weight: 116 lb (52.6 kg) IBW/kg (Calculated) : 45.5 Heparin Dosing Weight: 52.6  Vital Signs: Temp: 98.2 F (36.8 C) (01/14 1437) Temp Source: Oral (01/14 1437) BP: 170/76 (01/14 1630) Pulse Rate: 58 (01/14 1630)  Labs: Recent Labs    07/15/18 1440  HGB 13.9  HCT 43.3  PLT 211  CREATININE 0.79  TROPONINI 0.29*    Estimated Creatinine Clearance: 43.6 mL/min (by C-G formula based on SCr of 0.79 mg/dL).   Medical History: Past Medical History:  Diagnosis Date  . Acoustic neuroma ([place])   . Anorexia   . Asthma   . Diastolic dysfunction    Per echo 08/2011 with moderate LVH, normal EF at 55 to 60%, & severe hypokinesis of the inferior wall. Grade 1 diastolic dysfunction  . Hydrocephalus ([place])    failed improvement with lumbar drain placement - managed by [place] neurology; revised in March 2013 with dramatic improvement  . Hyperlipidemia   . Hypertension   . ST elevation MI (STEMI) ([place]) 08/2011   Inferior wall MI with severe diffuse CAD (90% prox LAD, 70% mid LAD, 80% prox DX1, 70% mid LCX & 100% RCA) with BMS to the RCA. Not felt to be a surgical candidate due to her poor neurologic condition with no hope for recovery. Will be managed medically  . Stroke [facility])     Medications:  (Not in a hospital admission)   Assessment: 78 y.o. female with past medical history of anorexia, acoustic neuroma, asthma, diastolic dysfunction, HLD, HTN, stroke, and STEMI presents to the ER for worsening midsternal chest pain pleuritic in nature radiating to the left shoulder associated with shortness of breath but no cough.  The pain started at home 3 days ago.  No fevers.  Does have extensive cardiac history as well.  Per chart review no recent history of anticoagulation.  Goal of Therapy:  Heparin level 0.3-0.7  units/ml Monitor platelets by anticoagulation protocol: Yes   Plan:  Give 3150 units bolus x 1 Start heparin infusion at 600 units/hr Check heparin level every 8 hours until two consecutive therapeutic levels then daily and CBC daily while on heparin Continue to monitor H&H and platelets  Forrest Moron, PharmD Clinical Pharmacist 07/15/2018,5:05 PM

## 2018-07-15 NOTE — ED Triage Notes (Signed)
See first nurse note. 

## 2018-07-15 NOTE — ED Triage Notes (Signed)
Pt in via EMS from home with c/o SOB for 3 days worsening. Pt now with CP that radiates through to the back. SB 50-55, 142/72, 100%RA  #18g left AC.

## 2018-07-15 NOTE — ED Triage Notes (Signed)
Pt states fatigue and sharp CP since yesterday.

## 2018-07-15 NOTE — ED Notes (Signed)
Pt has been updated on transfer to inpatient bed. Pt updated on room number and reports she will update her children. Interpreter used.

## 2018-07-15 NOTE — ED Notes (Signed)
Waiting on heparin from pharmacy

## 2018-07-15 NOTE — H&P (Signed)
[place] [facility] [place] NAME: [name]    MR#:  [id number]  DATE OF BIRTH:  Jun 13, 1941  DATE OF ADMISSION:  07/15/2018  PRIMARY CARE PHYSICIAN: Albina Billet, MD   REQUESTING/REFERRING PHYSICIAN: Dr. Merlyn Lot  CHIEF COMPLAINT:   Chief Complaint  Patient presents with  . Shortness of Breath  . Chest Pain    HISTORY OF PRESENT ILLNESS:  [name]  is a 78 y.o. 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 Patient speaks [place] and is a poor historian.  She stays at home with her son.  Very active at baseline.  Denies any chest pain or dyspnea up until 3 days ago.  Complains of pain in midsternal region radiating to her back and some left arm numbness.  Associated with some dyspnea, no diaphoresis or nausea.    Denies any fevers, chills, cough, congestion or postnasal drip. Her first troponin is noted to be elevated.  PAST MEDICAL HISTORY:   Past Medical History:  Diagnosis Date  . Acoustic neuroma ([place])   . Anorexia   . Asthma   . Diastolic dysfunction    Per echo 08/2011 with moderate LVH, normal EF at 55 to 60%, & severe hypokinesis of the inferior wall. Grade 1 diastolic dysfunction  . Hydrocephalus ([place])    failed improvement with lumbar drain placement - managed by [facility]; revised in March 2013 with dramatic improvement  . Hyperlipidemia   . Hypertension   . ST elevation MI (STEMI) ([place]) 08/2011   Inferior wall MI with severe diffuse CAD (90% prox LAD, 70% mid LAD, 80% prox DX1, 70% mid LCX & 100% RCA) with BMS to the RCA. Not felt to be a surgical candidate due to her poor neurologic condition with no hope  for recovery. Will be managed medically  . Stroke [facility])     PAST SURGICAL HISTORY:   Past Surgical History:  Procedure Laterality Date  . CORONARY STENT PLACEMENT  08/18/2011   BMS to the RCA  . intracranial shunt  april 2013  . LEFT HEART CATHETERIZATION WITH CORONARY ANGIOGRAM N/A 08/18/2011   Procedure: LEFT HEART CATHETERIZATION WITH CORONARY ANGIOGRAM;  Surgeon: Peter M Martinique, MD;  Location: [facility] CATH LAB;  Service: Cardiovascular;  Laterality: N/A;  . PERCUTANEOUS CORONARY STENT INTERVENTION (PCI-S) Right 08/18/2011   Procedure: PERCUTANEOUS CORONARY STENT INTERVENTION (PCI-S);  Surgeon: Peter M Martinique, MD;  Location: [facility] CATH LAB;  Service: Cardiovascular;  Laterality: Right;    SOCIAL HISTORY:   Social History   Tobacco Use  . Smoking status: Never Smoker  . Smokeless tobacco: Never Used  Substance Use Topics  . Alcohol use: No    FAMILY HISTORY:  No family history on file.  DRUG ALLERGIES:  No Known Allergies  REVIEW OF SYSTEMS:   Review of Systems  Constitutional: Negative for chills, fever, malaise/fatigue and weight loss.  HENT: Negative for ear discharge, ear pain, hearing loss, nosebleeds and tinnitus.   Eyes: Negative for blurred vision, double vision and photophobia.  Respiratory: Positive for shortness of breath. Negative for cough, hemoptysis and wheezing.   Cardiovascular: Positive for chest pain and palpitations. Negative for orthopnea and leg  swelling.  Gastrointestinal: Negative for abdominal pain, constipation, diarrhea, heartburn, melena, nausea and vomiting.  Genitourinary: Negative for dysuria, frequency, hematuria and urgency.  Musculoskeletal: Negative for back pain, myalgias and neck pain.  Skin: Negative for rash.  Neurological: Negative for dizziness, tingling, tremors, sensory change, speech change, focal weakness and headaches.  Endo/Heme/Allergies: Does not bruise/bleed easily.  Psychiatric/Behavioral: Negative for depression.     MEDICATIONS AT HOME:   Prior to Admission medications   Medication Sig Start Date End Date Taking? Authorizing Provider  aspirin EC 81 MG EC tablet Take 1 tablet (81 mg total) by mouth daily. 08/23/11 07/15/18 Yes Arguello, Roger A, PA-C  atorvastatin (LIPITOR) 40 MG tablet Take 40 mg by mouth at bedtime. 11/22/17  Yes [provider]  clopidogrel (PLAVIX) 75 MG tablet Take 1 tablet (75 mg total) by mouth daily. 06/09/12  Yes Martinique, Peter M, MD  metoprolol tartrate (LOPRESSOR) 25 MG tablet Take 1 tablet (25 mg total) by mouth 2 (two) times daily. 08/23/11 07/15/18 Yes Arguello, Roger A, PA-C      VITAL SIGNS:  Blood pressure (!) 197/67, pulse (!) 55, temperature 98.2 F (36.8 C), temperature source Oral, resp. rate 18, height 5' (1.524 m), weight 52.6 kg, SpO2 97 %.  PHYSICAL EXAMINATION:   Physical Exam  GENERAL:  78 y.o.-year-old patient lying in the bed with no acute distress.  EYES: Pupils equal, round, reactive to light and accommodation. No scleral icterus. Extraocular muscles intact.  HEENT: Head atraumatic, normocephalic. Oropharynx and nasopharynx clear.  NECK:  Supple, no jugular venous distention. No thyroid enlargement, no tenderness.  LUNGS: Normal breath sounds bilaterally, no wheezing, rales,rhonchi or crepitation. No use of accessory muscles of respiration.  CARDIOVASCULAR: S1, S2 normal. No murmurs, rubs, or gallops.  ABDOMEN: Soft, nontender, nondistended. Bowel sounds present. No organomegaly or mass.  EXTREMITIES: No pedal edema, cyanosis, or clubbing.  NEUROLOGIC: Cranial nerves II through XII are intact. Muscle strength 5/5 in all extremities. Sensation intact. Gait not checked.  PSYCHIATRIC: The patient is alert and oriented x 3.  SKIN: No obvious rash, lesion, or ulcer.   LABORATORY PANEL:   CBC Recent Labs  Lab 07/15/18 1440  WBC 8.4  HGB 13.9  HCT 43.3  PLT 211    ------------------------------------------------------------------------------------------------------------------  Chemistries  Recent Labs  Lab 07/15/18 1440  NA 138  K 4.3  CL 108  CO2 22  GLUCOSE 114*  BUN 17  CREATININE 0.79  CALCIUM 8.1*   ------------------------------------------------------------------------------------------------------------------  Cardiac Enzymes Recent Labs  Lab 07/15/18 1440  TROPONINI 0.29*   ------------------------------------------------------------------------------------------------------------------  RADIOLOGY:  Dg Chest 2 View  Result Date: 07/15/2018 CLINICAL DATA:  Shortness of breath for several days EXAM: CHEST - 2 VIEW COMPARISON:  09/23/2012 FINDINGS: Cardiac shadow remains enlarged. Aortic calcifications are again seen. Ventriculoperitoneal shunt catheter is noted. The lungs are well aerated bilaterally with patchy left basilar infiltrate projecting in the lingula. Chronic compression deformity at the thoracolumbar junction is noted. No other bony abnormality is seen. IMPRESSION: Left basilar infiltrate consistent with pneumonia. Electronically Signed   By: Inez Catalina M.D.   On: 07/15/2018 15:06   Ct Angio Chest Pe W And/or Wo Contrast  Result Date: 07/15/2018 CLINICAL DATA:  Chest pain, shortness of breath. EXAM: CT ANGIOGRAPHY CHEST WITH CONTRAST TECHNIQUE: Multidetector CT imaging of the chest was performed using the standard protocol during bolus administration of intravenous contrast. Multiplanar CT image reconstructions and MIPs were obtained to evaluate the vascular anatomy. CONTRAST:  61mL OMNIPAQUE IOHEXOL 350 MG/ML  SOLN COMPARISON:  Radiographs of same day.  CT scan of June 20, 2012. FINDINGS: Cardiovascular: Satisfactory opacification of the pulmonary arteries to the segmental level. No evidence of pulmonary embolism. Mild cardiomegaly is noted. Atherosclerosis of thoracic aorta is noted without aneurysm formation. No  pericardial effusion. Mediastinum/Nodes: No enlarged mediastinal, hilar, or axillary lymph nodes. Thyroid gland, trachea, and esophagus demonstrate no significant findings. Lungs/Pleura: No pneumothorax or pleural effusion is noted. Minimal bibasilar subsegmental atelectasis is noted. Minimal left upper lobe opacities are noted most consistent with subsegmental atelectasis or possibly inflammation. Upper Abdomen: No acute abnormality. Musculoskeletal: No chest wall abnormality. No acute or significant osseous findings. Review of the MIP images confirms the above findings. IMPRESSION: No definite evidence of pulmonary embolus. Minimal bibasilar subsegmental atelectasis, as well as minimal left upper lobe opacities concerning for subsegmental atelectasis or possibly inflammation. Aortic Atherosclerosis (ICD10-I70.0). Electronically Signed   By: Marijo Conception, M.D.   On: 07/15/2018 15:59    EKG:   Orders placed or performed during the hospital encounter of 07/15/18  . ED EKG within 10 minutes  . ED EKG within 10 minutes  . EKG 12-Lead  . EKG 12-Lead    IMPRESSION AND PLAN:   Starkisha Tullis  is a 78 y.o. 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 NSTEMI-admit to telemetry, started on heparin drip -Recycle troponins.  Nitropaste -Continue aspirin, Plavix, statin and metoprolol -Cardiology consulted-[facility] notified. -Echocardiogram -N.p.o. after midnight in case she will need a cardiac cath  2.  Hyperlipidemia-continue statin.    3.  Hypertension -hypertensive urgency on presentation.  Continue metoprolol.  Added  hydralazine PRN.  Add meds as needed  4.  DVT prophylaxis-already on  heparin drip  5.  History of a caustic neuroma-status post surgery.  Denies any neurological complaints.  Also has a VP shunt -Continue outpatient follow-up    All the records are reviewed and case discussed with ED provider. Management plans discussed with the patient, family and they are in agreement.  CODE STATUS: Full Code  TOTAL TIME TAKING CARE OF THIS PATIENT: 51 minutes.    Gladstone Lighter M.D on 07/15/2018 at 6:01 PM  Between 7am to 6pm - Pager - [phone]  After 6pm go to [email or web address] - password [email or web address] [facility]  Office  [phone]  CC: Primary care physician; Albina Billet, MD

## 2018-07-15 NOTE — ED Notes (Signed)
Interpreter used  

## 2018-07-15 NOTE — ED Provider Notes (Signed)
[facility] Emergency Department Provider Note    First MD Initiated Contact with Patient 07/15/18 1524     (approximate)  I have reviewed the triage vital signs and the nursing notes.   HISTORY  Chief Complaint Shortness of Breath and Chest Pain    HPI [name] is a 78 y.o. female below is a past medical history presents the ER for worsening midsternal chest pain pleuritic in nature radiating to the left shoulder associated with shortness of breath but no cough.  The pain started at home 3 days ago.  No fevers.  Does have extensive cardiac history as well.  States that she been compliant with her medications.  No chills.  No fevers.  Has not taken anything for the pain.   Does sound like the patient has been not taking her cardiac medication but she is unable to recall which type of medication it is.  States that she has not taken it for the past 4 months.  Cardiac Cath: 2014:   LAD PCI - 90% mid LAD  XB 3.5 , 2.5 X 15 , stented with 3.0 X 22 Bare Metal Stent - [facility]  Meds - Bivalrudin   Past Medical History:  Diagnosis Date  . Acoustic neuroma ([place])   . Anorexia   . Asthma   . Diastolic dysfunction    Per echo 08/2011 with moderate LVH, normal EF at 55 to 60%, & severe hypokinesis of the inferior wall. Grade 1 diastolic dysfunction  . Hydrocephalus ([place])    failed improvement with lumbar drain placement - managed by [facility]; revised in March 2013 with dramatic improvement  . Hyperlipidemia   . Hypertension   . ST elevation MI (STEMI) ([place]) 08/2011   Inferior wall MI with severe diffuse CAD (90% prox LAD, 70% mid LAD, 80% prox DX1, 70% mid LCX & 100% RCA) with BMS to the RCA. Not felt to be a surgical candidate due to her poor neurologic condition with no hope for recovery. Will be managed medically  . Stroke [facility])    No family history on file. Past Surgical History:  Procedure Laterality Date  . CORONARY STENT PLACEMENT   08/18/2011   BMS to the RCA  . intracranial shunt  april 2013  . LEFT HEART CATHETERIZATION WITH CORONARY ANGIOGRAM N/A 08/18/2011   Procedure: LEFT HEART CATHETERIZATION WITH CORONARY ANGIOGRAM;  Surgeon: Peter M Martinique, MD;  Location[facility] CATH LAB;  Service: Cardiovascular;  Laterality: N/A;  . PERCUTANEOUS CORONARY STENT INTERVENTION (PCI-S) Right 08/18/2011   Procedure: PERCUTANEOUS CORONARY STENT INTERVENTION (PCI-S);  Surgeon: Peter M Martinique, MD;  Location: [facility] CATH LAB;  Service: Cardiovascular;  Laterality: Right;   Patient Active Problem List   Diagnosis Date Noted  . ST elevation myocardial infarction (STEMI) of inferior wall ([place]) 08/21/2011  . CAD (coronary artery disease) 08/21/2011  . Hydrocephalus ([place]) 08/21/2011  . Acoustic neuroma ([place]) 08/21/2011  . HTN (hypertension) 08/20/2011  . Hypercholesterolemia 08/20/2011      Prior to Admission medications   Medication Sig Start Date End Date Taking? Authorizing Provider  aspirin EC 81 MG EC tablet Take 1 tablet (81 mg total) by mouth daily. 08/23/11 07/15/18 Yes Arguello, Roger A, PA-C  atorvastatin (LIPITOR) 40 MG tablet Take 40 mg by mouth at bedtime. 11/22/17  Yes [provider]  clopidogrel (PLAVIX) 75 MG tablet Take 1 tablet (75 mg total) by mouth daily. 06/09/12  Yes Martinique, Peter M, MD  metoprolol tartrate (LOPRESSOR) 25  MG tablet Take 1 tablet (25 mg total) by mouth 2 (two) times daily. 08/23/11 07/15/18 Yes Arguello, Lyda Perone, PA-C    Allergies Patient has no known allergies.    Social History Social History   Tobacco Use  . Smoking status: Never Smoker  . Smokeless tobacco: Never Used  Substance Use Topics  . Alcohol use: No  . Drug use: No    Review of Systems Patient denies headaches, rhinorrhea, blurry vision, numbness, shortness of breath, chest pain, edema, cough, abdominal pain, nausea, vomiting, diarrhea, dysuria, fevers, rashes or hallucinations unless otherwise stated above in  HPI. ____________________________________________   PHYSICAL EXAM:  VITAL SIGNS: Vitals:   07/15/18 1620 07/15/18 1630  BP:  (!) 170/76  Pulse: (!) 52 (!) 58  Resp: 14 (!) 21  Temp:    SpO2: 99% 97%    Constitutional: Alert and oriented.  Eyes: Conjunctivae are normal.  Head: Atraumatic. Nose: No congestion/rhinnorhea. Mouth/Throat: Mucous membranes are moist.   Neck: No stridor. Painless ROM.  Cardiovascular: Normal rate, regular rhythm. Grossly normal heart sounds.  Good peripheral circulation. Respiratory: Normal respiratory effort.  No retractions. Lungs CTAB. Gastrointestinal: Soft and nontender. No distention. No abdominal bruits. No CVA tenderness. Genitourinary:  Musculoskeletal: No lower extremity tenderness nor edema.  No joint effusions. Neurologic:  Normal speech and language. No gross focal neurologic deficits are appreciated. No facial droop Skin:  Skin is warm, dry and intact. No rash noted. Psychiatric: Mood and affect are normal. Speech and behavior are normal.  ____________________________________________   LABS (all labs ordered are listed, but only abnormal results are displayed)  Results for orders placed or performed during the hospital encounter of 07/15/18 (from the past 24 hour(s))  Basic metabolic panel     Status: Abnormal   Collection Time: 07/15/18  2:40 PM  Result Value Ref Range   Sodium 138 135 - 145 mmol/L   Potassium 4.3 3.5 - 5.1 mmol/L   Chloride 108 98 - 111 mmol/L   CO2 22 22 - 32 mmol/L   Glucose, Bld 114 (H) 70 - 99 mg/dL   BUN 17 8 - 23 mg/dL   Creatinine, Ser 0.79 0.44 - 1.00 mg/dL   Calcium 8.1 (L) 8.9 - 10.3 mg/dL   GFR calc non Af Amer >60 >60 mL/min   GFR calc Af Amer >60 >60 mL/min   Anion gap 8 5 - 15  CBC     Status: None   Collection Time: 07/15/18  2:40 PM  Result Value Ref Range   WBC 8.4 4.0 - 10.5 K/uL   RBC 4.59 3.87 - 5.11 MIL/uL   Hemoglobin 13.9 12.0 - 15.0 g/dL   HCT 43.3 36.0 - 46.0 %   MCV 94.3 80.0  - 100.0 fL   MCH 30.3 26.0 - 34.0 pg   MCHC 32.1 30.0 - 36.0 g/dL   RDW 12.2 11.5 - 15.5 %   Platelets 211 150 - 400 K/uL   nRBC 0.0 0.0 - 0.2 %  Troponin I - ONCE - STAT     Status: Abnormal   Collection Time: 07/15/18  2:40 PM  Result Value Ref Range   Troponin I 0.29 (HH) <0.03 ng/mL   ____________________________________________  EKG My review and personal interpretation at Time: 14:41   Indication: sob  Rate: 55  Rhythm: sinus Axis: normal Other: normal intervals, no stemi ____________________________________________  RADIOLOGY  I personally reviewed all radiographic images ordered to evaluate for the above acute complaints and reviewed radiology reports and  findings.  These findings were personally discussed with the patient.  Please see medical record for radiology report.  ____________________________________________   PROCEDURES  Procedure(s) performed:  .Critical Care Performed by: Merlyn Lot, MD Authorized by: Merlyn Lot, MD   Critical care provider statement:    Critical care time (minutes):  30   Critical care was necessary to treat or prevent imminent or life-threatening deterioration of the following conditions:  Cardiac failure   Critical care was time spent personally by me on the following activities:  Discussions with consultants, evaluation of patient's response to treatment, examination of patient, ordering and performing treatments and interventions, ordering and review of laboratory studies, ordering and review of radiographic studies, pulse oximetry, re-evaluation of patient's condition, obtaining history from patient or surrogate and review of old charts      Critical Care performed: yes ____________________________________________   INITIAL IMPRESSION / [place] / ED COURSE  Pertinent labs & imaging results that were available during my care of the patient were reviewed by me and considered in my medical decision  making (see chart for details).   DDX: ACS, pericarditis, esophagitis, boerhaaves, pe, dissection, pna, bronchitis, costochondritis   Donique Hammonds Vahle is a 78 y.o. who presents to the ED with symptoms as described above.  Patient describing chest pain for the past 3 days with extensive cardiac history.  Does not seem consistent with STEMI based on normal EKG but troponin is elevated to 0.2.  Has been off medications therefore will give aspirin and heparin.  Will order CT angiogram to evaluate for PE as she is describing some pleuritic chest pain as well.  No findings to suggest pneumonia.  Blood pressure otherwise stable.  Clinical Course as of Jul 15 1704  Tue Jul 15, 2018  1640 CTA does not show any evidence of PE.  Not consistent with dissection. Discussed case with Dr. Saralyn Pilar of cardiology.  Given patient's elevated troponin and significant history of CAD will heparinize give aspirin and admit to the hospital.   [PR]    Clinical Course User Index [PR] Merlyn Lot, MD     As part of my medical decision making, I reviewed the following data within the [place] notes reviewed and incorporated, Labs reviewed, notes from prior ED visits.   ____________________________________________   FINAL CLINICAL IMPRESSION(S) / ED DIAGNOSES  Final diagnoses:  Chest pain, unspecified type  Elevated troponin I level      NEW MEDICATIONS STARTED DURING THIS VISIT:  New Prescriptions   No medications on file     Note:  This document was prepared using Dragon voice recognition software and may include unintentional dictation errors.    Merlyn Lot, MD 07/15/18 [phone]

## 2018-07-15 NOTE — ED Notes (Signed)
ED TO INPATIENT HANDOFF REPORT  Name/Age/Gender [name] 78 y.o. female  Code Status   Home/SNF/Other Home   Chief Complaint breathing problems  Level of Care/Admitting Diagnosis ED Disposition    ED Disposition Condition [place]: [place] [[id number]]  Level of Care: Telemetry [5]  Diagnosis: NSTEMI (non-ST elevated myocardial infarction) [facility]) [[id number]]  Admitting Physician: Gladstone Lighter [[id number]]  Attending Physician: Gladstone Lighter [[id number]]  Estimated length of stay: past midnight tomorrow  Certification:: I certify this patient will need inpatient services for at least 2 midnights  PT Class (Do Not Modify): Inpatient [101]  PT Acc Code (Do Not Modify): Private [1]       Medical History Past Medical History:  Diagnosis Date  . Acoustic neuroma ([place])   . Anorexia   . Asthma   . Diastolic dysfunction    Per echo 08/2011 with moderate LVH, normal EF at 55 to 60%, & severe hypokinesis of the inferior wall. Grade 1 diastolic dysfunction  . Hydrocephalus ([place])    failed improvement with lumbar drain placement - managed by [place] neurology; revised in March 2013 with dramatic improvement  . Hyperlipidemia   . Hypertension   . 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 Not felt to be a surgical candidate due to her poor neurologic condition with no hope for recovery. Will be managed medically  . Stroke [facility])     Allergies No Known Allergies  IV Location/Drains/Wounds Patient Lines/Drains/Airways Status   Active Line/Drains/Airways    Name:   Placement date:   Placement time:   Site:   Days:   Peripheral IV 07/15/18 Left Antecubital   07/15/18    1444    Antecubital   less than 1   Peripheral IV 07/15/18 Left Forearm   07/15/18    1746    Forearm   less than 1   Incision 08/18/11 Groin Right    08/18/11    2100     2523          Labs/Imaging Results for orders placed or performed during the hospital encounter of 07/15/18 (from the past 48 hour(s))  Basic metabolic panel     Status: Abnormal   Collection Time: 07/15/18  2:40 PM  Result Value Ref Range   Sodium 138 135 - 145 mmol/L   Potassium 4.3 3.5 - 5.1 mmol/L    Comment: HEMOLYSIS AT THIS LEVEL MAY AFFECT RESULT   Chloride 108 98 - 111 mmol/L   CO2 22 22 - 32 mmol/L   Glucose, Bld 114 (H) 70 - 99 mg/dL   BUN 17 8 - 23 mg/dL   Creatinine, Ser 0.79 0.44 - 1.00 mg/dL   Calcium 8.1 (L) 8.9 - 10.3 mg/dL   GFR calc non Af Amer >60 >60 mL/min   GFR calc Af Amer >60 >60 mL/min   Anion gap 8 5 - 15    Comment: Performed at [facility], [address] [place], [place] [address]  CBC     Status: None   Collection Time: 07/15/18  2:40 PM  Result Value Ref Range   WBC 8.4 4.0 - 10.5 K/uL   RBC 4.59 3.87 - 5.11 MIL/uL   Hemoglobin 13.9 12.0 - 15.0 g/dL   HCT 43.3 36.0 - 46.0 %  MCV 94.3 80.0 - 100.0 fL   MCH 30.3 26.0 - 34.0 pg   MCHC 32.1 30.0 - 36.0 g/dL   RDW 12.2 11.5 - 15.5 %   Platelets 211 150 - 400 K/uL   nRBC 0.0 0.0 - 0.2 %    Comment: Performed at [facility], [address] [place], [place] [address]  Troponin I - ONCE - STAT     Status: Abnormal   Collection Time: 07/15/18  2:40 PM  Result Value Ref Range   Troponin I 0.29 (HH) <0.03 ng/mL    Comment: CRITICAL RESULT CALLED TO, READ BACK BY AND VERIFIED WITH  TIFFANEY JOHNSON AT 1856 ON 07/15/18 BY SNJ. Performed at [facility], [address] [place], [place] [address]   Lipid panel     Status: Abnormal   Collection Time: 07/15/18  2:40 PM  Result Value Ref Range   Cholesterol 170 0 - 200 mg/dL   Triglycerides 112 <150 mg/dL   HDL 46 >40 mg/dL   Total CHOL/HDL Ratio 3.7 RATIO   VLDL 22 0 - 40 mg/dL   LDL Cholesterol 102 (H) 0 - 99 mg/dL    Comment:        Total Cholesterol/HDL:CHD Risk Coronary Heart Disease  Risk Table                     Men   Women  1/2 Average Risk   3.4   3.3  Average Risk       5.0   4.4  2 X Average Risk   9.6   7.1  3 X Average Risk  23.4   11.0        Use the calculated Patient Ratio above and the CHD Risk Table to determine the patient's CHD Risk.        ATP III CLASSIFICATION (LDL):  <100     mg/dL   Optimal  100-129  mg/dL   Near or Above                    Optimal  130-159  mg/dL   Borderline  160-189  mg/dL   High  >190     mg/dL   Very High Performed at [facility], [address] [place],  [address]   APTT     Status: None   Collection Time: 07/15/18  4:43 PM  Result Value Ref Range   aPTT 26 24 - 36 seconds    Comment: Performed at [facility], [address] [place],  [address]  Protime-INR     Status: None   Collection Time: 07/15/18  4:43 PM  Result Value Ref Range   Prothrombin Time 11.9 11.4 - 15.2 seconds   INR 0.88     Comment: Performed at [facility], [address], [place],  [address]   Dg Chest 2 View  Result Date: 07/15/2018 CLINICAL DATA:  Shortness of breath for several days EXAM: CHEST - 2 VIEW COMPARISON:  09/23/2012 FINDINGS: Cardiac shadow remains enlarged. Aortic calcifications are again seen. Ventriculoperitoneal shunt catheter is noted. The lungs are well aerated bilaterally with patchy left basilar infiltrate projecting in the lingula. Chronic compression deformity at the thoracolumbar junction is noted. No other bony abnormality is seen. IMPRESSION: Left basilar infiltrate consistent with pneumonia. Electronically Signed   By: Inez Catalina M.D.   On: 07/15/2018 15:06   Ct Angio Chest Pe W And/or Wo Contrast  Result  Date: 07/15/2018 CLINICAL DATA:  Chest pain, shortness of breath. EXAM: CT ANGIOGRAPHY CHEST WITH CONTRAST TECHNIQUE: Multidetector CT imaging of the chest was performed using the standard protocol during bolus administration of intravenous contrast.  Multiplanar CT image reconstructions and MIPs were obtained to evaluate the vascular anatomy. CONTRAST:  55mL OMNIPAQUE IOHEXOL 350 MG/ML SOLN COMPARISON:  Radiographs of same day.  CT scan of June 20, 2012. FINDINGS: Cardiovascular: Satisfactory opacification of the pulmonary arteries to the segmental level. No evidence of pulmonary embolism. Mild cardiomegaly is noted. Atherosclerosis of thoracic aorta is noted without aneurysm formation. No pericardial effusion. Mediastinum/Nodes: No enlarged mediastinal, hilar, or axillary lymph nodes. Thyroid gland, trachea, and esophagus demonstrate no significant findings. Lungs/Pleura: No pneumothorax or pleural effusion is noted. Minimal bibasilar subsegmental atelectasis is noted. Minimal left upper lobe opacities are noted most consistent with subsegmental atelectasis or possibly inflammation. Upper Abdomen: No acute abnormality. Musculoskeletal: No chest wall abnormality. No acute or significant osseous findings. Review of the MIP images confirms the above findings. IMPRESSION: No definite evidence of pulmonary embolus. Minimal bibasilar subsegmental atelectasis, as well as minimal left upper lobe opacities concerning for subsegmental atelectasis or possibly inflammation. Aortic Atherosclerosis (ICD10-I70.0). Electronically Signed   By: Marijo Conception, M.D.   On: 07/15/2018 15:59    Pending Labs Unresulted Labs (From admission, onward)    Start     Ordered   07/15/18 1727  Heparin level (unfractionated)  Add-on,   AD     07/15/18 1726   Signed and Held  Troponin I - Now Then Q6H  Now then every 6 hours,   R     Signed and Held   Signed and Held  TSH  Once,   R     Signed and Held   Signed and Held  Hemoglobin A1c  Once,   R     Signed and Held   Signed and Held  Basic metabolic panel  Tomorrow morning,   R     Signed and Held   Signed and Held  CBC  Tomorrow morning,   R     Signed and Held          Vitals/Pain Today's Vitals   07/15/18 1745  07/15/18 1759 07/15/18 1800 07/15/18 1801  BP:   (!) 153/79   Pulse:  (!) 54 (!) 55 (!) 54  Resp:  20 18 16   Temp:      TempSrc:      SpO2:  96% 97% 96%  Weight:      Height:      PainSc: 0-No pain       Isolation Precautions No active isolations  Medications Medications  fentaNYL (SUBLIMAZE) injection 25 mcg (25 mcg Intravenous Given 07/15/18 1700)  heparin ADULT infusion 100 units/mL (25000 units/283mL sodium chloride 0.45%) (600 Units/hr Intravenous New Bag/Given 07/15/18 1759)  hydrALAZINE (APRESOLINE) injection 10 mg (has no administration in time range)  iohexol (OMNIPAQUE) 350 MG/ML injection 75 mL (75 mLs Intravenous Contrast Given 07/15/18 1542)  aspirin chewable tablet 324 mg (324 mg Oral Given 07/15/18 1653)  nitroGLYCERIN (NITROGLYN) 2 % ointment 0.5 inch (0.5 inches Topical Given 07/15/18 1701)  heparin bolus via infusion 3,150 Units (3,150 Units Intravenous Bolus from Bag 07/15/18 1802)    Mobility Independent per family

## 2018-07-16 ENCOUNTER — Encounter
Admission: EM | Disposition: A | Discharge status: Home / Self Care | Payer: Self-pay | Source: Home / Self Care | Attending: Internal Medicine

## 2018-07-16 ENCOUNTER — Other Ambulatory Visit: Payer: Self-pay

## 2018-07-16 HISTORY — PX: LEFT HEART CATH: CATH118248

## 2018-07-16 HISTORY — PX: CORONARY ANGIOGRAPHY: CATH118303

## 2018-07-16 LAB — BASIC METABOLIC PANEL
Anion gap: 6 (ref 5–15)
BUN: 16 mg/dL (ref 8–23)
CO2: 24 mmol/L (ref 22–32)
Calcium: 8.3 mg/dL — ABNORMAL LOW (ref 8.9–10.3)
Chloride: 110 mmol/L (ref 98–111)
Creatinine, Ser: 0.66 mg/dL (ref 0.44–1.00)
GFR calc Af Amer: >60 mL/min (ref >60)
GFR calc non Af Amer: >60 mL/min (ref >60)
Glucose, Bld: 101 mg/dL — ABNORMAL HIGH (ref 70–99)
Potassium: 3.7 mmol/L (ref 3.5–5.1)
Sodium: 140 mmol/L (ref 135–145)

## 2018-07-16 LAB — CBC
HCT: 41.7 % (ref 36.0–46.0)
Hemoglobin: 13.6 g/dL (ref 12.0–15.0)
MCH: 30.6 pg (ref 26.0–34.0)
MCHC: 32.6 g/dL (ref 30.0–36.0)
MCV: 93.7 fL (ref 80.0–100.0)
Platelets: 188 10*3/uL (ref 150–400)
RBC: 4.45 MIL/uL (ref 3.87–5.11)
RDW: 12.2 % (ref 11.5–15.5)
WBC: 8.5 10*3/uL (ref 4.0–10.5)
nRBC: 0 % (ref 0.0–0.2)

## 2018-07-16 LAB — ECHOCARDIOGRAM COMPLETE
Height: 58 in
Weight: 2507.2 oz

## 2018-07-16 LAB — TROPONIN I
Troponin I: 0.96 ng/mL (ref <0.03)
Troponin I: 0.97 ng/mL (ref <0.03)

## 2018-07-16 LAB — HEPARIN LEVEL (UNFRACTIONATED)
Heparin Unfractionated: 0.51 IU/mL (ref 0.30–0.70)
Heparin Unfractionated: 0.4 IU/mL (ref 0.30–0.70)

## 2018-07-16 SURGERY — LEFT HEART CATH
Anesthesia: Moderate Sedation

## 2018-07-16 MED ORDER — SODIUM CHLORIDE 0.9% FLUSH: 3.0000 mL | INTRAVENOUS | Status: DC | PRN

## 2018-07-16 MED ORDER — SODIUM CHLORIDE 0.9 % IV SOLN: 250.0000 mL | INTRAVENOUS | Status: DC | PRN

## 2018-07-16 MED ORDER — SODIUM CHLORIDE 0.9 % WEIGHT BASED INFUSION
1.0000 mL/kg/h | INTRAVENOUS | Status: DC
  Administered 2018-07-16: 1 mL/kg/h via INTRAVENOUS

## 2018-07-16 MED ORDER — MIDAZOLAM HCL 2 MG/2ML IJ SOLN
INTRAMUSCULAR | Status: DC | PRN
  Administered 2018-07-16: 0.5 mg via INTRAVENOUS

## 2018-07-16 MED ORDER — SODIUM CHLORIDE 0.9% FLUSH
3.0000 mL | Freq: Two times a day (BID) | INTRAVENOUS | Status: DC
  Administered 2018-07-17: 3 mL via INTRAVENOUS

## 2018-07-16 MED ORDER — IOPAMIDOL (ISOVUE-300) INJECTION 61%
INTRAVENOUS | Status: DC | PRN
  Administered 2018-07-16: 125 mL via INTRA_ARTERIAL

## 2018-07-16 MED ORDER — FENTANYL CITRATE (PF) 100 MCG/2ML IJ SOLN
INTRAMUSCULAR | Status: DC | PRN
  Administered 2018-07-16: 25 ug via INTRAVENOUS

## 2018-07-16 MED ORDER — ACETAMINOPHEN 325 MG PO TABS: 650.0000 mg | ORAL_TABLET | ORAL | Status: DC | PRN

## 2018-07-16 MED ORDER — TRAMADOL HCL 50 MG PO TABS
50.0000 mg | ORAL_TABLET | Freq: Four times a day (QID) | ORAL | Status: DC | PRN
  Administered 2018-07-16 – 2018-07-17 (×2): 50 mg via ORAL
  Filled 2018-07-16 (×2): qty 1

## 2018-07-16 MED ORDER — ONDANSETRON HCL 4 MG/2ML IJ SOLN: 4.0000 mg | Freq: Four times a day (QID) | INTRAMUSCULAR | Status: DC | PRN

## 2018-07-16 MED ORDER — HEPARIN (PORCINE) IN NACL 1000-0.9 UT/500ML-% IV SOLN
INTRAVENOUS | Status: AC
  Filled 2018-07-16: qty 1000

## 2018-07-16 MED ORDER — ASPIRIN 81 MG PO CHEW: 81.0000 mg | CHEWABLE_TABLET | ORAL | Status: DC

## 2018-07-16 MED ORDER — MIDAZOLAM HCL 2 MG/2ML IJ SOLN
INTRAMUSCULAR | Status: AC
  Filled 2018-07-16: qty 2

## 2018-07-16 MED ORDER — SODIUM CHLORIDE 0.9 % WEIGHT BASED INFUSION
3.0000 mL/kg/h | INTRAVENOUS | Status: AC
  Administered 2018-07-16: 3 mL/kg/h via INTRAVENOUS

## 2018-07-16 MED ORDER — NITROGLYCERIN 0.4 MG SL SUBL: 0.4000 mg | SUBLINGUAL_TABLET | SUBLINGUAL | Status: DC | PRN

## 2018-07-16 MED ORDER — FENTANYL CITRATE (PF) 100 MCG/2ML IJ SOLN
INTRAMUSCULAR | Status: AC
  Filled 2018-07-16: qty 2

## 2018-07-16 MED ORDER — ISOSORBIDE MONONITRATE ER 30 MG PO TB24
30.0000 mg | ORAL_TABLET | Freq: Every day | ORAL | Status: DC
  Administered 2018-07-16 – 2018-07-17 (×2): 30 mg via ORAL
  Filled 2018-07-16 (×3): qty 1

## 2018-07-16 MED ORDER — SODIUM CHLORIDE 0.9% FLUSH
3.0000 mL | Freq: Two times a day (BID) | INTRAVENOUS | Status: DC
  Administered 2018-07-16: 3 mL via INTRAVENOUS

## 2018-07-16 SURGICAL SUPPLY — 13 items
CATH AMP RT 5F (CATHETERS) ×1 IMPLANT
CATH INFINITI 5 FR 3DRC (CATHETERS) ×1 IMPLANT
CATH INFINITI 5FR AL1 (CATHETERS) ×1 IMPLANT
CATH INFINITI 5FR ANG PIGTAIL (CATHETERS) ×1 IMPLANT
CATH INFINITI 5FR JL4 (CATHETERS) ×1 IMPLANT
CATH INFINITI JR4 5F (CATHETERS) ×1 IMPLANT
DEVICE CLOSURE MYNXGRIP 5F (Vascular Products) ×1 IMPLANT
KIT MANI 3VAL PERCEP (MISCELLANEOUS) ×2 IMPLANT
NDL PERC 18GX7CM (NEEDLE) IMPLANT
NEEDLE PERC 18GX7CM (NEEDLE) ×2 IMPLANT
PACK CARDIAC CATH (CUSTOM PROCEDURE TRAY) ×2 IMPLANT
SHEATH AVANTI 5FR X 11CM (SHEATH) ×1 IMPLANT
WIRE GUIDERIGHT .035X150 (WIRE) ×1 IMPLANT

## 2018-07-16 NOTE — Consult Note (Signed)
ANTICOAGULATION CONSULT NOTE - Initial Consult  Pharmacy Consult for heparin infusion Indication: chest pain/ACS  No Known Allergies  Patient Measurements: Height: 4\' 10"  (147.3 cm) Weight: 156 lb 11.2 oz (71.1 kg) IBW/kg (Calculated) : 40.9 Heparin Dosing Weight: 52.6  Vital Signs: Temp: 98.1 F (36.7 C) (01/14 2114) Temp Source: Oral (01/14 2114) BP: 160/72 (01/14 2126) Pulse Rate: 53 (01/14 2126)  Labs: Recent Labs    07/15/18 1440 07/15/18 1643 07/15/18 1912 07/16/18 0101 07/16/18 0236  HGB 13.9  --   --   --   --   HCT 43.3  --   --   --   --   PLT 211  --   --   --   --   APTT  --  26  --   --   --   LABPROT  --  11.9  --   --   --   INR  --  0.88  --   --   --   HEPARINUNFRC  --  <0.10*  --   --  0.51  CREATININE 0.79  --   --   --   --   TROPONINI 0.29*  --  0.46* 0.96*  --     Estimated Creatinine Clearance: 50.8 mL/min (by C-G formula based on SCr of 0.79 mg/dL).   Medical History: Past Medical History:  Diagnosis Date  . Acoustic neuroma ([place])   . Anorexia   . Asthma   . Diastolic dysfunction    Per echo 08/2011 with moderate LVH, normal EF at 55 to 60%, & severe hypokinesis of the inferior wall. Grade 1 diastolic dysfunction  . Hydrocephalus ([place])    failed improvement with lumbar drain placement - managed by [place] neurology; revised in March 2013 with dramatic improvement  . Hyperlipidemia   . Hypertension   . 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 Not felt to be a surgical candidate due to her poor neurologic condition with no hope for recovery. Will be managed medically  . Stroke [facility])     Medications:  Medications Prior to Admission  Medication Sig Dispense Refill Last Dose  . aspirin EC 81 MG EC tablet Take 1 tablet (81 mg total) by mouth daily.   07/15/2018 at 1200  . atorvastatin (LIPITOR) 40 MG tablet Take 40 mg  by mouth at bedtime.   07/14/2018 at 2100  . clopidogrel (PLAVIX) 75 MG tablet Take 1 tablet (75 mg total) by mouth daily. 30 tablet 3 07/15/2018 at 1200  . metoprolol tartrate (LOPRESSOR) 25 MG tablet Take 1 tablet (25 mg total) by mouth 2 (two) times daily. 60 tablet 3 07/15/2018 at 1200    Assessment: 78 y.o. female with past medical history of anorexia, acoustic neuroma, asthma, diastolic dysfunction, HLD, HTN, stroke, and STEMI presents to the ER for worsening midsternal chest pain pleuritic in nature radiating to the left shoulder associated with shortness of breath but no cough.  The pain started at home 3 days ago.  No fevers.  Does have extensive cardiac history as well.  Per chart review no recent history of anticoagulation.  Goal of Therapy:  Heparin level 0.3-0.7 units/ml Monitor platelets by anticoagulation protocol: Yes   Plan:  01/15 @ 0230 HL 0.51 therapeutic. Will continue current rate and will recheck HL @ 1100.  Shanon Brow  Estil Daft, PharmD Clinical Pharmacist 07/16/2018,3:23 AM

## 2018-07-16 NOTE — Consult Note (Signed)
ANTICOAGULATION CONSULT NOTE - Initial Consult  Pharmacy Consult for heparin infusion Indication: chest pain/ACS  No Known Allergies  Patient Measurements: Height: 4\' 10"  (147.3 cm) Weight: 156 lb (70.8 kg) IBW/kg (Calculated) : 40.9 Heparin Dosing Weight: 52.6  Vital Signs: Temp: 98.8 F (37.1 C) (01/15 1244) Temp Source: Oral (01/15 1244) BP: 157/71 (01/15 1244) Pulse Rate: 55 (01/15 1244)  Labs: Recent Labs    07/15/18 1440 07/15/18 1643 07/15/18 1912 07/16/18 0101 07/16/18 0236 07/16/18 0647 07/16/18 1100  HGB 13.9  --   --   --   --  13.6  --   HCT 43.3  --   --   --   --  41.7  --   PLT 211  --   --   --   --  188  --   APTT  --  26  --   --   --   --   --   LABPROT  --  11.9  --   --   --   --   --   INR  --  0.88  --   --   --   --   --   HEPARINUNFRC  --  <0.10*  --   --  0.51  --  0.40  CREATININE 0.79  --   --   --   --  0.66  --   TROPONINI 0.29*  --  0.46* 0.96*  --  0.97*  --     Estimated Creatinine Clearance: 50.7 mL/min (by C-G formula based on SCr of 0.66 mg/dL).   Medical History: Past Medical History:  Diagnosis Date  . Acoustic neuroma ([place])   . Anorexia   . Asthma   . Diastolic dysfunction    Per echo 08/2011 with moderate LVH, normal EF at 55 to 60%, & severe hypokinesis of the inferior wall. Grade 1 diastolic dysfunction  . Hydrocephalus ([place])    failed improvement with lumbar drain placement - managed by [place] neurology; revised in March 2013 with dramatic improvement  . Hyperlipidemia   . Hypertension   . 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 Not felt to be a surgical candidate due to her poor neurologic condition with no hope for recovery. Will be managed medically  . Stroke [facility])     Medications:  Medications Prior to Admission  Medication Sig Dispense Refill Last Dose  . aspirin EC 81 MG EC tablet  Take 1 tablet (81 mg total) by mouth daily.   07/15/2018 at 1200  . atorvastatin (LIPITOR) 40 MG tablet Take 40 mg by mouth at bedtime.   07/14/2018 at 2100  . clopidogrel (PLAVIX) 75 MG tablet Take 1 tablet (75 mg total) by mouth daily. 30 tablet 3 07/15/2018 at 1200  . metoprolol tartrate (LOPRESSOR) 25 MG tablet Take 1 tablet (25 mg total) by mouth 2 (two) times daily. 60 tablet 3 07/15/2018 at 1200    Assessment: 78 y.o. female with past medical history of anorexia, acoustic neuroma, asthma, diastolic dysfunction, HLD, HTN, stroke, and STEMI presents to the ER for worsening midsternal chest pain pleuritic in nature radiating to the left shoulder associated with shortness of breath but no cough.  The pain started at home 3 days ago.  No fevers.  Does have extensive cardiac history as well.  Per  chart review no recent history of anticoagulation.  Goal of Therapy:  Heparin level 0.3-0.7 units/ml Monitor platelets by anticoagulation protocol: Yes   Plan:  01/15 @ 1100 HL 0.40 therapeutic. Will continue current rate. Patient has gone for Cardiac Cath will follow up on plan for Heparin post cath. Daily CBC while on Heparin drip.  Paulina Fusi, PharmD, BCPS 07/16/2018 1:40 PM

## 2018-07-16 NOTE — Progress Notes (Signed)
Used the [employer]. Interpreter number [phone]

## 2018-07-16 NOTE — Progress Notes (Signed)
Pt returns from cardiac cath. VSS, no complaints of pain, right groin is clean, dry and intact no hematoma noted. I will continue to assess.

## 2018-07-16 NOTE — Progress Notes (Signed)
Theatre stage manager w/ [employer] Intepreter used for Pre Procedure Checklist. Pt request more information on Advanced Directives, does not wish to complete prior to procedure. Will notify floor RN regarding Pt wishes upon transfer back to floor.

## 2018-07-16 NOTE — Progress Notes (Signed)
Pt complains of chest to back pain. RN gives dose of imdur. Pt now complains of left neck pain. MD notified. Orders placed. I will continue to assess.

## 2018-07-16 NOTE — Consult Note (Signed)
[facility] Cardiology  CARDIOLOGY CONSULT NOTE  Patient ID: [name] MRN: [id number] DOB/AGE: 1941-04-10 78 y.o.  Admit date: 07/15/2018 Referring Physician [place] Primary Physician [facility] [place] [facility] [place] Reason for Consultation NSTEMI  HPI: 77 year old female referred for evaluation of chest pain and elevated troponin ( history obtained via video interpreter ).  The patient has known coronary artery disease, status post inferolateral STEMI 06/2012 at which time she received BMS RCA.  She underwent BMS for residual LAD stenosis 07/2012.  The patient is currently followed at [facility].  She has done well until yesterday when she developed substernal chest pain and presented to [facility] ED.  ECG revealed sinus bradycardia at 55 bpm with left ventricular [place] without acute ischemic ST-T wave changes.  The patient ruled in for non-ST elevation myocardial infarction with initial troponin 0 0.46, follow-up troponins of 0.96 and 0.97.  The patient is chest pain-free today.  Review of systems complete and found to be negative unless listed above     Past Medical History:  Diagnosis Date  . Acoustic neuroma ([place])   . Anorexia   . Asthma   . Diastolic dysfunction    Per echo 08/2011 with moderate LVH, normal EF at 55 to 60%, & severe hypokinesis of the inferior wall. Grade 1 diastolic dysfunction  . Hydrocephalus ([place])    failed improvement with lumbar drain placement - managed by [place] neurology; revised in March 2013 with dramatic improvement  . Hyperlipidemia   . Hypertension   . ST elevation MI (STEMI) ([place]) 08/2011   Inferior wall MI with severe diffuse CAD (90% prox LAD, 70% mid LAD, 80% prox DX1, 70% mid LCX & 100% RCA) with BMS to the RCA. Not felt to be a surgical candidate due to her poor neurologic condition with no hope for recovery. Will be managed medically  . Stroke [facility])     Past Surgical History:  Procedure Laterality Date  . CORONARY STENT PLACEMENT  08/18/2011   BMS to  the RCA  . intracranial shunt  april 2013  . LEFT HEART CATHETERIZATION WITH CORONARY ANGIOGRAM N/A 08/18/2011   Procedure: LEFT HEART CATHETERIZATION WITH CORONARY ANGIOGRAM;  Surgeon: Peter M Martinique, MD;  Location: [facility] CATH LAB;  Service: Cardiovascular;  Laterality: N/A;  . PERCUTANEOUS CORONARY STENT INTERVENTION (PCI-S) Right 08/18/2011   Procedure: PERCUTANEOUS CORONARY STENT INTERVENTION (PCI-S);  Surgeon: Peter M Martinique, MD;  Location: [facility] CATH LAB;  Service: Cardiovascular;  Laterality: Right;    Medications Prior to Admission  Medication Sig Dispense Refill Last Dose  . aspirin EC 81 MG EC tablet Take 1 tablet (81 mg total) by mouth daily.   07/15/2018 at 1200  . atorvastatin (LIPITOR) 40 MG tablet Take 40 mg by mouth at bedtime.   07/14/2018 at 2100  . clopidogrel (PLAVIX) 75 MG tablet Take 1 tablet (75 mg total) by mouth daily. 30 tablet 3 07/15/2018 at 1200  . metoprolol tartrate (LOPRESSOR) 25 MG tablet Take 1 tablet (25 mg total) by mouth 2 (two) times daily. 60 tablet 3 07/15/2018 at 1200   Social History   Socioeconomic History  . Marital status: Married    Spouse name: Not on file  . Number of children: Not on file  . Years of education: Not on file  . Highest education level: Not on file  Occupational History  . Not on file  Social Needs  . Financial resource strain: Not on file  . Food insecurity:    Worry: Not on file  Inability: Not on file  . Transportation needs:    Medical: Not on file    Non-medical: Not on file  Tobacco Use  . Smoking status: Never Smoker  . Smokeless tobacco: Never Used  Substance and Sexual Activity  . Alcohol use: No  . Drug use: No  . Sexual activity: Never  Lifestyle  . Physical activity:    Days per week: Not on file    Minutes per session: Not on file  . Stress: Not on file  Relationships  . Social connections:    Talks on phone: Not on file    Gets together: Not on file    Attends religious service: Not on file    Active  member of club or organization: Not on file    Attends meetings of clubs or organizations: Not on file    Relationship status: Not on file  . Intimate partner violence:    Fear of current or ex partner: Not on file    Emotionally abused: Not on file    Physically abused: Not on file    Forced sexual activity: Not on file  Other Topics Concern  . Not on file  Social History Narrative   Lives at home with son, Independent at baseline    History reviewed. No pertinent family history.    Review of systems complete and found to be negative unless listed above      PHYSICAL EXAM  General: Well developed, well nourished, in no acute distress HEENT:  Normocephalic and atramatic Neck:  No JVD.  Lungs: Clear bilaterally to auscultation and percussion. Heart: HRRR . Normal S1 and S2 without gallops or murmurs.  Abdomen: Bowel sounds are positive, abdomen soft and non-tender  Msk:  Back normal, normal gait. Normal strength and tone for age. Extremities: No clubbing, cyanosis or edema.   Neuro: Alert and oriented X 3. Psych:  Good affect, responds appropriately  Labs:   Lab Results  Component Value Date   WBC 8.5 07/16/2018   HGB 13.6 07/16/2018   HCT 41.7 07/16/2018   MCV 93.7 07/16/2018   PLT 188 07/16/2018    Recent Labs  Lab 07/16/18 0647  NA 140  K 3.7  CL 110  CO2 24  BUN 16  CREATININE 0.66  CALCIUM 8.3*  GLUCOSE 101*   Lab Results  Component Value Date   CKTOTAL 102 09/23/2012   CKMB 1.2 09/23/2012   TROPONINI 0.97 (HH) 07/16/2018    Lab Results  Component Value Date   CHOL 170 07/15/2018   CHOL 137 06/21/2012   CHOL 152 05/06/2012   Lab Results  Component Value Date   HDL 46 07/15/2018   HDL 53 06/21/2012   HDL 45.50 05/06/2012   Lab Results  Component Value Date   LDLCALC 102 (H) 07/15/2018   LDLCALC 70 06/21/2012   LDLCALC 79 05/06/2012   Lab Results  Component Value Date   TRIG 112 07/15/2018   TRIG 68 06/21/2012   TRIG 139.0  05/06/2012   Lab Results  Component Value Date   CHOLHDL 3.7 07/15/2018   CHOLHDL 3 05/06/2012   CHOLHDL 3 10/24/2011   Lab Results  Component Value Date   LDLDIRECT 78.2 10/24/2011      Radiology: Dg Chest 2 View  Result Date: 07/15/2018 CLINICAL DATA:  Shortness of breath for several days EXAM: CHEST - 2 VIEW COMPARISON:  09/23/2012 FINDINGS: Cardiac shadow remains enlarged. Aortic calcifications are again seen. Ventriculoperitoneal shunt catheter is noted. The lungs  are well aerated bilaterally with patchy left basilar infiltrate projecting in the lingula. Chronic compression deformity at the thoracolumbar junction is noted. No other bony abnormality is seen. IMPRESSION: Left basilar infiltrate consistent with pneumonia. Electronically Signed   By: Inez Catalina M.D.   On: 07/15/2018 15:06   Ct Angio Chest Pe W And/or Wo Contrast  Result Date: 07/15/2018 CLINICAL DATA:  Chest pain, shortness of breath. EXAM: CT ANGIOGRAPHY CHEST WITH CONTRAST TECHNIQUE: Multidetector CT imaging of the chest was performed using the standard protocol during bolus administration of intravenous contrast. Multiplanar CT image reconstructions and MIPs were obtained to evaluate the vascular anatomy. CONTRAST:  62mL OMNIPAQUE IOHEXOL 350 MG/ML SOLN COMPARISON:  Radiographs of same day.  CT scan of June 20, 2012. FINDINGS: Cardiovascular: Satisfactory opacification of the pulmonary arteries to the segmental level. No evidence of pulmonary embolism. Mild cardiomegaly is noted. Atherosclerosis of thoracic aorta is noted without aneurysm formation. No pericardial effusion. Mediastinum/Nodes: No enlarged mediastinal, hilar, or axillary lymph nodes. Thyroid gland, trachea, and esophagus demonstrate no significant findings. Lungs/Pleura: No pneumothorax or pleural effusion is noted. Minimal bibasilar subsegmental atelectasis is noted. Minimal left upper lobe opacities are noted most consistent with subsegmental atelectasis  or possibly inflammation. Upper Abdomen: No acute abnormality. Musculoskeletal: No chest wall abnormality. No acute or significant osseous findings. Review of the MIP images confirms the above findings. IMPRESSION: No definite evidence of pulmonary embolus. Minimal bibasilar subsegmental atelectasis, as well as minimal left upper lobe opacities concerning for subsegmental atelectasis or possibly inflammation. Aortic Atherosclerosis (ICD10-I70.0). Electronically Signed   By: Marijo Conception, M.D.   On: 07/15/2018 15:59    EKG: Sinus bradycardia, LVH  ASSESSMENT AND PLAN:   1.  Non-ST elevation myocardial infarction 2.  Known CAD, history of inferolateral STEMI status post BMS RCA 06/2012, BMS residual LAD stenosis 07/2012  Recommendations  1.  Agree with current therapy 2.  Proceed with cardiac catheterization and possible PCI.  The risks, benefits alternatives of cardiac catheterization and possible PCI were explained to the patient and informed written consent was obtained.  Signed: Isaias Cowman MD,PhD, [facility] 07/16/2018, 7:57 AM

## 2018-07-16 NOTE — Progress Notes (Signed)
[place] [facility] [place] NAME: [name]    MR#:  [id number]  DATE OF BIRTH:  07/07/1940  SUBJECTIVE:  CHIEF COMPLAINT:   Chief Complaint  Patient presents with  . Shortness of Breath  . Chest Pain   Complains of 3 out of 10 chest pain No shortness of breath.  Son at bedside.  Waiting for catheterization.  REVIEW OF SYSTEMS:    Review of Systems  Constitutional: Positive for malaise/fatigue. Negative for chills and fever.  HENT: Negative for sore throat.   Eyes: Negative for blurred vision, double vision and pain.  Respiratory: Negative for cough, hemoptysis, shortness of breath and wheezing.   Cardiovascular: Positive for chest pain. Negative for palpitations, orthopnea and leg swelling.  Gastrointestinal: Negative for abdominal pain, constipation, diarrhea, heartburn, nausea and vomiting.  Genitourinary: Negative for dysuria and hematuria.  Musculoskeletal: Negative for back pain and joint pain.  Skin: Negative for rash.  Neurological: Negative for sensory change, speech change, focal weakness and headaches.  Endo/Heme/Allergies: Does not bruise/bleed easily.  Psychiatric/Behavioral: Negative for depression. The patient is not nervous/anxious.     DRUG ALLERGIES:  No Known Allergies  VITALS:  Blood pressure (!) 167/63, pulse 66, temperature 97.6 F (36.4 C), temperature source Oral, resp. rate 19, height 4\' 10"  (1.473 m), weight 71.6 kg, SpO2 97 %.  PHYSICAL EXAMINATION:   Physical Exam  GENERAL:  78 y.o.-year-old patient lying in the bed with no acute distress.  EYES: Pupils equal, round, reactive to light and accommodation. No scleral icterus. Extraocular muscles intact.  HEENT: Head atraumatic, normocephalic. Oropharynx and nasopharynx clear.  NECK:  Supple, no jugular venous distention. No thyroid enlargement, no tenderness.  LUNGS: Normal breath sounds bilaterally, no wheezing, rales, rhonchi. No use of accessory muscles of  respiration.  CARDIOVASCULAR: S1, S2 normal. No murmurs, rubs, or gallops.  ABDOMEN: Soft, nontender, nondistended. Bowel sounds present. No organomegaly or mass.  EXTREMITIES: No cyanosis, clubbing or edema b/l.    NEUROLOGIC: Cranial nerves II through XII are intact. No focal Motor or sensory deficits b/l.   PSYCHIATRIC: The patient is alert and oriented x 3.  SKIN: No obvious rash, lesion, or ulcer.   LABORATORY PANEL:   CBC Recent Labs  Lab 07/16/18 0647  WBC 8.5  HGB 13.6  HCT 41.7  PLT 188   ------------------------------------------------------------------------------------------------------------------ Chemistries  Recent Labs  Lab 07/16/18 0647  NA 140  K 3.7  CL 110  CO2 24  GLUCOSE 101*  BUN 16  CREATININE 0.66  CALCIUM 8.3*   ------------------------------------------------------------------------------------------------------------------  Cardiac Enzymes Recent Labs  Lab 07/16/18 0647  TROPONINI 0.97*   ------------------------------------------------------------------------------------------------------------------  RADIOLOGY:  Dg Chest 2 View  Result Date: 07/15/2018 CLINICAL DATA:  Shortness of breath for several days EXAM: CHEST - 2 VIEW COMPARISON:  09/23/2012 FINDINGS: Cardiac shadow remains enlarged. Aortic calcifications are again seen. Ventriculoperitoneal shunt catheter is noted. The lungs are well aerated bilaterally with patchy left basilar infiltrate projecting in the lingula. Chronic compression deformity at the thoracolumbar junction is noted. No other bony abnormality is seen. IMPRESSION: Left basilar infiltrate consistent with pneumonia. Electronically Signed   By: Inez Catalina M.D.   On: 07/15/2018 15:06   Ct Angio Chest Pe W And/or Wo Contrast  Result Date: 07/15/2018 CLINICAL DATA:  Chest pain, shortness of breath. EXAM: CT ANGIOGRAPHY CHEST WITH CONTRAST TECHNIQUE: Multidetector CT imaging of the chest was performed using the  standard protocol during bolus administration of intravenous contrast. Multiplanar CT  image reconstructions and MIPs were obtained to evaluate the vascular anatomy. CONTRAST:  57mL OMNIPAQUE IOHEXOL 350 MG/ML SOLN COMPARISON:  Radiographs of same day.  CT scan of June 20, 2012. FINDINGS: Cardiovascular: Satisfactory opacification of the pulmonary arteries to the segmental level. No evidence of pulmonary embolism. Mild cardiomegaly is noted. Atherosclerosis of thoracic aorta is noted without aneurysm formation. No pericardial effusion. Mediastinum/Nodes: No enlarged mediastinal, hilar, or axillary lymph nodes. Thyroid gland, trachea, and esophagus demonstrate no significant findings. Lungs/Pleura: No pneumothorax or pleural effusion is noted. Minimal bibasilar subsegmental atelectasis is noted. Minimal left upper lobe opacities are noted most consistent with subsegmental atelectasis or possibly inflammation. Upper Abdomen: No acute abnormality. Musculoskeletal: No chest wall abnormality. No acute or significant osseous findings. Review of the MIP images confirms the above findings. IMPRESSION: No definite evidence of pulmonary embolus. Minimal bibasilar subsegmental atelectasis, as well as minimal left upper lobe opacities concerning for subsegmental atelectasis or possibly inflammation. Aortic Atherosclerosis (ICD10-I70.0). Electronically Signed   By: Marijo Conception, M.D.   On: 07/15/2018 15:59     ASSESSMENT AND PLAN:   [name]  is a 78 y.o. female with a known history of CAD status post STEMI in 2013 showing diffuse coronary artery disease with bare-metal stent to RCA, however cardiac bypass surgery was not performed given her history of Acoustic neuroma status post surgery and VP shunt, hypertension and hyperlipidemia presents to hospital secondary to chest pain   1. NSTEMI-  telemetry,  on heparin drip -  Nitropaste.  Add nitro sublingual as needed -Continue aspirin, Plavix, statin and  metoprolol -Appreciate cardiology input.  Cardiac catheterization planned for later today. -Echocardiogram ordered and pending  2.  Hyperlipidemia-continue statin.    3.  Hypertension -hypertensive urgency on presentation.  Continue metoprolol.  Added  hydralazine PRN.  Add meds as needed  4.  DVT prophylaxis-already on heparin drip  5.  History of acoustic neuroma-status post surgery.  Denies any neurological complaints.  Also has a VP shunt -Continue outpatient follow-up  All the records are reviewed and case discussed with Care Management/Social Workerr. Management plans discussed with the patient, family and they are in agreement.  CODE STATUS: Full code  DVT Prophylaxis: SCDs  TOTAL TIME TAKING CARE OF THIS PATIENT: 35 minutes.   POSSIBLE D/C IN 1-2 DAYS, DEPENDING ON CLINICAL CONDITION.  Neita Carp M.D on 07/16/2018 at 12:23 PM  Between 7am to 6pm - Pager - [phone]  After 6pm go to [email or web address] - password [email or web address] [place] [facility]  Office  [phone]  CC: Primary care physician; Albina Billet, MD  Note: This dictation was prepared with Dragon dictation along with smaller phrase technology. Any transcriptional errors that result from this process are unintentional.

## 2018-07-17 ENCOUNTER — Encounter: Payer: Self-pay | Admitting: Cardiology

## 2018-07-17 MED ORDER — ISOSORBIDE MONONITRATE ER 60 MG PO TB24: 60.0000 mg | ORAL_TABLET | Freq: Every day | ORAL | 0 refills | Status: DC

## 2018-07-17 MED ORDER — ASPIRIN 81 MG PO TBEC: 81.0000 mg | DELAYED_RELEASE_TABLET | Freq: Every day | ORAL | 0 refills | Status: DC

## 2018-07-17 MED ORDER — LISINOPRIL 2.5 MG PO TABS: 2.5000 mg | ORAL_TABLET | Freq: Every day | ORAL | 0 refills | Status: DC

## 2018-07-17 MED ORDER — LISINOPRIL 5 MG PO TABS
2.5000 mg | ORAL_TABLET | Freq: Every day | ORAL | Status: DC
  Administered 2018-07-17: 2.5 mg via ORAL
  Filled 2018-07-17: qty 1

## 2018-07-17 MED ORDER — TRAMADOL HCL 50 MG PO TABS: 50.0000 mg | ORAL_TABLET | Freq: Four times a day (QID) | ORAL | 0 refills | Status: DC | PRN

## 2018-07-17 MED ORDER — METOPROLOL TARTRATE 25 MG PO TABS: 25.0000 mg | ORAL_TABLET | Freq: Two times a day (BID) | ORAL | 0 refills | Status: DC

## 2018-07-17 MED ORDER — NITROGLYCERIN 0.4 MG SL SUBL: 0.4000 mg | SUBLINGUAL_TABLET | SUBLINGUAL | 0 refills | Status: DC | PRN

## 2018-07-17 MED ORDER — ISOSORBIDE MONONITRATE ER 60 MG PO TB24: 60.0000 mg | ORAL_TABLET | Freq: Every day | ORAL | Status: DC

## 2018-07-17 NOTE — Plan of Care (Signed)
  Problem: Activity: Goal: Ability to return to baseline activity level will improve Outcome: Progressing   Problem: Cardiovascular: Goal: Ability to achieve and maintain adequate cardiovascular perfusion will improve Outcome: Progressing Goal: Vascular access site(s) Level 0-1 will be maintained Outcome: Progressing   

## 2018-07-17 NOTE — Progress Notes (Signed)
Cardiovascular and Pulmonary Nurse Navigator Note:    This RN returned to patient's room to speak with son as patient wanted this RN to speak with her son about Cardiac Rehab.      Discussed the elevated troponins, definition of CAD, and findings of NSTEMI.  Patient and son verbalized understanding of NSTEMI.   ? Discussed modifiable risk factors including controlling blood pressure, cholesterol, and blood sugar; following heart healthy diet; maintaining healthy weight; exercise; and smoking cessation, if applicable.  ? Medications to be reviewed with patient by assigned nurse prior to discharge.  Dr. Darvin Neighbours discussing anti-anginal medication regimen with Dr. Saralyn Pilar.   ? Discussed emergency plan for heart attack symptoms. Patient verbalized understanding of need to call 911 and not to drive himself to ER if having cardiac symptoms / chest pain.  ? Heart healthy diet of low sodium, low fat, low cholesterol heart healthy diet discussed. Information on diet provided. Handout of DASH Diet in [place] given to patient and son.   Smoking Cessation - Patient is a NEVER smoker.   ? Exercise - Benefits of exercised discussed. Patient's son reports that patient walks every morning for about an hour and every evening seven days per week.   Informed patient that her cardiologist has referred her to outpatient Cardiac Rehab. An overview of the program was provided.  Patient's son was wanting to know if this could be provided in the home or if we provided transportation to and from the program.  Explained to patient we are not currently set up to provide the program in the home nor do we provide transportation to and form the program.  Patient has declined participation in the program and plans to continue her current walking plan and start following a DASH Diet at home.   ? Patient / son requesting a follow-up appointment with Dr. Saralyn Pilar in [place] instead of returning to [facility].  Dr. Darvin Neighbours made aware  of this request.    Patient / son appreciative of the information.  ? Roanna Epley, RN, BSN, [place]  [facility] Cardiac & Pulmonary Rehab  Cardiovascular & Pulmonary Nurse Navigator  Direct Line: [phone]  Department Phone #: [phone] Fax: [phone]  Email Address: [email or web address]

## 2018-07-17 NOTE — Progress Notes (Signed)
[facility] Cardiology  SUBJECTIVE: Patient laying in bed, denies chest pain   Vitals:   07/16/18 1445 07/16/18 1504 07/16/18 1915 07/17/18 0343  BP: 140/63 (!) 159/78 (!) 152/68 (!) 129/58  Pulse: (!) 56 (!) 54 65 60  Resp: 19 19    Temp:  (!) 97.5 F (36.4 C) 97.6 F (36.4 C) 98.6 F (37 C)  TempSrc:  Oral Oral Oral  SpO2: 95% 98% 98% 95%  Weight:      Height:         Intake/Output Summary (Last 24 hours) at 07/17/2018 0759 Last data filed at 07/17/2018 0344 Gross per 24 hour  Intake 600.08 ml  Output 150 ml  Net 450.08 ml      PHYSICAL EXAM  General: Well developed, well nourished, in no acute distress HEENT:  Normocephalic and atramatic Neck:  No JVD.  Lungs: Clear bilaterally to auscultation and percussion. Heart: HRRR . Normal S1 and S2 without gallops or murmurs.  Abdomen: Bowel sounds are positive, abdomen soft and non-tender  Msk:  Back normal, normal gait. Normal strength and tone for age. Extremities: No clubbing, cyanosis or edema.   Neuro: Alert and oriented X 3. Psych:  Good affect, responds appropriately   LABS: Basic Metabolic Panel: Recent Labs    07/15/18 1440 07/16/18 0647  NA 138 140  K 4.3 3.7  CL 108 110  CO2 22 24  GLUCOSE 114* 101*  BUN 17 16  CREATININE 0.79 0.66  CALCIUM 8.1* 8.3*   Liver Function Tests: No results for input(s): AST, ALT, ALKPHOS, BILITOT, PROT, ALBUMIN in the last 72 hours. No results for input(s): LIPASE, AMYLASE in the last 72 hours. CBC: Recent Labs    07/15/18 1440 07/16/18 0647  WBC 8.4 8.5  HGB 13.9 13.6  HCT 43.3 41.7  MCV 94.3 93.7  PLT 211 188   Cardiac Enzymes: Recent Labs    07/15/18 1912 07/16/18 0101 07/16/18 0647  TROPONINI 0.46* 0.96* 0.97*   BNP: Invalid input(s): POCBNP D-Dimer: No results for input(s): DDIMER in the last 72 hours. Hemoglobin A1C: Recent Labs    07/15/18 1912  HGBA1C 5.8*   Fasting Lipid Panel: Recent Labs    07/15/18 1440  CHOL 170  HDL 46  LDLCALC 102*   TRIG 112  CHOLHDL 3.7   Thyroid Function Tests: Recent Labs    07/15/18 1912  TSH 1.386   Anemia Panel: No results for input(s): VITAMINB12, FOLATE, FERRITIN, TIBC, IRON, RETICCTPCT in the last 72 hours.  Dg Chest 2 View  Result Date: 07/15/2018 CLINICAL DATA:  Shortness of breath for several days EXAM: CHEST - 2 VIEW COMPARISON:  09/23/2012 FINDINGS: Cardiac shadow remains enlarged. Aortic calcifications are again seen. Ventriculoperitoneal shunt catheter is noted. The lungs are well aerated bilaterally with patchy left basilar infiltrate projecting in the lingula. Chronic compression deformity at the thoracolumbar junction is noted. No other bony abnormality is seen. IMPRESSION: Left basilar infiltrate consistent with pneumonia. Electronically Signed   By: Inez Catalina M.D.   On: 07/15/2018 15:06   Ct Angio Chest Pe W And/or Wo Contrast  Result Date: 07/15/2018 CLINICAL DATA:  Chest pain, shortness of breath. EXAM: CT ANGIOGRAPHY CHEST WITH CONTRAST TECHNIQUE: Multidetector CT imaging of the chest was performed using the standard protocol during bolus administration of intravenous contrast. Multiplanar CT image reconstructions and MIPs were obtained to evaluate the vascular anatomy. CONTRAST:  47mL OMNIPAQUE IOHEXOL 350 MG/ML SOLN COMPARISON:  Radiographs of same day.  CT scan of June 20, 2012. FINDINGS: Cardiovascular: Satisfactory opacification of the pulmonary arteries to the segmental level. No evidence of pulmonary embolism. Mild cardiomegaly is noted. Atherosclerosis of thoracic aorta is noted without aneurysm formation. No pericardial effusion. Mediastinum/Nodes: No enlarged mediastinal, hilar, or axillary lymph nodes. Thyroid gland, trachea, and esophagus demonstrate no significant findings. Lungs/Pleura: No pneumothorax or pleural effusion is noted. Minimal bibasilar subsegmental atelectasis is noted. Minimal left upper lobe opacities are noted most consistent with subsegmental  atelectasis or possibly inflammation. Upper Abdomen: No acute abnormality. Musculoskeletal: No chest wall abnormality. No acute or significant osseous findings. Review of the MIP images confirms the above findings. IMPRESSION: No definite evidence of pulmonary embolus. Minimal bibasilar subsegmental atelectasis, as well as minimal left upper lobe opacities concerning for subsegmental atelectasis or possibly inflammation. Aortic Atherosclerosis (ICD10-I70.0). Electronically Signed   By: Marijo Conception, M.D.   On: 07/15/2018 15:59     Echo LVEF 50 to 55%  TELEMETRY: Sinus rhythm, 62 bpm:  ASSESSMENT AND PLAN:  Active Problems:   NSTEMI (non-ST elevated myocardial infarction) ([place])    1.  NSTEMI 2.  2 vessel CAD, patent stent LAD, sequential 70 and 80% stenosis small caliber D1, patent stent mid and distal RCA, segmental 70% stenosis ostial segment of PDA cardiac catheterization 07/16/2018 3.  Preserved left ventricular function  Recommendations  1.  Medical therapy 2.  Dual antiplatelet therapy 3.  May discharge home later today 4.  Follow-up [facility] cardiology or with me per patient wishes   Isaias Cowman, MD, PhD, [facility] 07/17/2018 7:59 AM

## 2018-07-17 NOTE — Discharge Instructions (Signed)
Heart healthy diet  No exertional activity or lifting heavy weights till you follow up with the heart doctor.

## 2018-07-17 NOTE — Progress Notes (Signed)
Cardiovascular and Pulmonary Nurse Navigator Note:    EDUCATION:   Rounded on patient to discuss Cardiac Rehab with patient. Patient lying flat in the bed.  Patient sat up on side of bed when this nurse entered the room.   Used the interpreter services.  Interpreter number [phone].  Explained the elevated troponin(s) and CAD as found on patient's Cardiac Cath with the cardiologist's plan to treat CAD medically.    [name]  CARDIAC CATHETERIZATION  Order# [id number]  Reading physician: Isaias Cowman, MD Ordering physician: Isaias Cowman, MD Study date: 07/16/18  Physicians   Panel Physicians Referring Physician Case Authorizing Physician  Paraschos, Sheppard Coil, MD (Primary)  Isaias Cowman, MD  Procedures   CORONARY ANGIOGRAPHY  Left Heart Cath  Conclusion     Ost 2nd Mrg to 2nd Mrg lesion is 30% stenosed.  Ost Cx to Prox Cx lesion is 20% stenosed.  Ost 1st Diag lesion is 70% stenosed.  1st Diag lesion is 80% stenosed.  Previously placed Prox LAD to Mid LAD stent (unknown type) is widely patent.  Dist LAD lesion is 75% stenosed.  Prox LAD lesion is 30% stenosed.  Prox RCA to Dist RCA lesion is 30% stenosed.  Ost RPDA to RPDA lesion is 70% stenosed.   1.  Two-vessel coronary artery disease, with patent stent LAD, sequential 70 and 80% stenosis small caliber D1, patent stent mid and distal RCA with insignificant 30% in-stent restenosis, segmental 70% stenosis in the ostial segment of the PDA 2.  Mildly reduced left ventricular function, with inferior wall akinesis  Recommendations  1.  Medical therapy 2.  Continue dual antiplatelet therapy 3.  Add isosorbide mononitrate 30 mg daily    Discussed the medication IMDUR (Isosorbide).  Patient verbalized understanding to the interpreter.    Informed patient the cardiologist has referred patient to outpatient Cardiac Rehab.  Overview of the program was provided.  Patient requested this RN return to  speak with her son about Cardiac Rehab.  Patient gave verbal permission for this RN to speak with son about her medical condition and Cardiac Rehab.    Will return to bedside when son arrives.    Roanna Epley, RN, BSN, [facility]  Cardiovascular & Pulmonary Nurse Navigator  Direct Line: [phone]  Department Phone #: [phone] Fax: [phone]  Email Address: [email or web address]

## 2018-07-17 NOTE — Progress Notes (Signed)
Discharge to home order in [facility], clarified with Dr Darvin Neighbours that patient is good to go.  IV removed x2, catether intact.   Discharge instructions in [place] and appointment given to patient and son with no further questions.  Patient appreciative.

## 2018-08-04 NOTE — Discharge Summary (Signed)
[facility] NAME: [name]    MR#:  [id number]  DATE OF BIRTH:  01-19-1941  DATE OF ADMISSION:  07/15/2018 ADMITTING PHYSICIAN: Gladstone Lighter, MD  DATE OF DISCHARGE: 07/17/2018  4:27 PM  PRIMARY CARE PHYSICIAN: Albina Billet, MD   ADMISSION DIAGNOSIS:  Elevated troponin I level [R79.89] Chest pain, unspecified type [R07.9]  DISCHARGE DIAGNOSIS:  Active Problems:   NSTEMI (non-ST elevated myocardial infarction) ([place])   SECONDARY DIAGNOSIS:   Past Medical History:  Diagnosis Date  . Acoustic neuroma ([place])   . Anorexia   . Asthma   . Diastolic dysfunction    Per echo 08/2011 with moderate LVH, normal EF at 55 to 60%, & severe hypokinesis of the inferior wall. Grade 1 diastolic dysfunction  . Hydrocephalus ([place])    failed improvement with lumbar drain placement - managed by [facility]; revised in March 2013 with dramatic improvement  . Hyperlipidemia   . Hypertension   . ST elevation MI (STEMI) ([place]) 08/2011   Inferior wall MI with severe diffuse CAD (90% prox LAD, 70% mid LAD, 80% prox DX1, 70% mid LCX & 100% RCA) with BMS to the RCA. Not felt to be a surgical candidate due to her poor neurologic condition with no hope for recovery. Will be managed medically  . Stroke [facility])      ADMITTING HISTORY  HISTORY OF PRESENT ILLNESS:  [name]  is a 78 y.o. 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 Patient speaks [place] and is a poor historian.  She stays at home with her son.  Very active at baseline.  Denies any chest pain or dyspnea up until 3 days ago.  Complains of pain in midsternal region radiating to her  back and some left arm numbness.  Associated with some dyspnea, no diaphoresis or nausea.    Denies any fevers, chills, cough, congestion or postnasal drip. Her first troponin is noted to be elevated.  HOSPITAL COURSE:   1.  NSTEMI 2.  2 vessel CAD, patent stent LAD, sequential 70 and 80% stenosis small caliber D1, patent stent mid and distal RCA, segmental 70% stenosis ostial segment of PDA cardiac catheterization 07/16/2018 3.  Preserved left ventricular function 4.  Hypertension  Patient admitted to telemetry floor with aspirin, statin, beta-blocker and heparin drip.  Was taken to cardiac catheterization by cardiology with above findings.  Not amenable to PCI.  Chest pain resolved with increased nitro.  Medical management advised.  Patient discharged home in stable condition with cardiac rehab referral and cardiology and PCP follow-up. On dual antiplatelet therapy.  Stable for discharge home  CONSULTS OBTAINED:  Treatment Team:  Isaias Cowman, MD  DRUG ALLERGIES:  No Known Allergies  DISCHARGE MEDICATIONS:   Allergies as of 07/17/2018   No Known Allergies     Medication List    TAKE these medications   aspirin 81 MG EC tablet Take 1 tablet (81 mg total) by mouth daily for 30 days.   atorvastatin 40 MG tablet Commonly known as:  LIPITOR Take 40 mg by mouth at bedtime.   clopidogrel 75 MG tablet Commonly known as:  PLAVIX Take 1 tablet (75  mg total) by mouth daily.   isosorbide mononitrate 60 MG 24 hr tablet Commonly known as:  IMDUR Take 1 tablet (60 mg total) by mouth daily.   lisinopril 2.5 MG tablet Commonly known as:  PRINIVIL,ZESTRIL Take 1 tablet (2.5 mg total) by mouth daily.   metoprolol tartrate 25 MG tablet Commonly known as:  LOPRESSOR Take 1 tablet (25 mg total) by mouth 2 (two) times daily for 30 days.   nitroGLYCERIN 0.4 MG SL tablet Commonly known as:  NITROSTAT Place 1 tablet (0.4 mg total) under the tongue every 5 (five) minutes as  needed for chest pain.   traMADol 50 MG tablet Commonly known as:  ULTRAM Take 1 tablet (50 mg total) by mouth every 6 (six) hours as needed for moderate pain or severe pain.       Today   VITAL SIGNS:  Blood pressure (!) 152/75, pulse 60, temperature 98.2 F (36.8 C), temperature source Oral, resp. rate 18, height 4\' 10"  (1.473 m), weight 70.8 kg, SpO2 95 %.  I/O:  No intake or output data in the 24 hours ending 08/04/18 1124  PHYSICAL EXAMINATION:  Physical Exam  GENERAL:  78 y.o.-year-old patient lying in the bed with no acute distress.  LUNGS: Normal breath sounds bilaterally, no wheezing, rales,rhonchi or crepitation. No use of accessory muscles of respiration.  CARDIOVASCULAR: S1, S2 normal. No murmurs, rubs, or gallops.  ABDOMEN: Soft, non-tender, non-distended. Bowel sounds present. No organomegaly or mass.  NEUROLOGIC: Moves all 4 extremities. PSYCHIATRIC: The patient is alert and oriented x 3.  SKIN: No obvious rash, lesion, or ulcer.   DATA REVIEW:   CBC No results for input(s): WBC, HGB, HCT, PLT in the last 168 hours.  Chemistries  No results for input(s): NA, K, CL, CO2, GLUCOSE, BUN, CREATININE, CALCIUM, MG, AST, ALT, ALKPHOS, BILITOT in the last 168 hours.  Invalid input(s): GFRCGP  Cardiac Enzymes No results for input(s): TROPONINI in the last 168 hours.  Microbiology Results  Results for orders placed or performed during the hospital encounter of 08/18/11  MRSA PCR Screening     Status: None   Collection Time: 08/18/11  9:50 PM  Result Value Ref Range Status   MRSA by PCR NEGATIVE NEGATIVE Final    Comment:        The GeneXpert MRSA Assay (FDA approved for NASAL specimens only), is one component of a comprehensive MRSA colonization surveillance program. It is not intended to diagnose MRSA infection nor to guide or monitor treatment for MRSA infections.    RADIOLOGY:  No results found.  Follow up with PCP in 1 week.  Management plans  discussed with the patient, family and they are in agreement.  CODE STATUS:  Code Status History    Date Active Date Inactive Code Status Order ID Comments User Context   07/15/2018 1859 07/17/2018 1933 Full Code [id number]  Gladstone Lighter, MD Inpatient      TOTAL TIME TAKING CARE OF THIS PATIENT ON DAY OF DISCHARGE: more than 30 minutes.   Leia Alf Eden Toohey M.D on 08/04/2018 at 11:24 AM  Between 7am to 6pm - Pager - [phone]  After 6pm go to [email or web address] - password [email or web address] [facility]  Office  [phone]  CC: Primary care physician; Albina Billet, MD  Note: This dictation was prepared with Dragon dictation along with smaller phrase technology. Any transcriptional errors that result from this process are unintentional.

## 2019-09-28 ENCOUNTER — Ambulatory Visit: Payer: Medicare HMO | Attending: Internal Medicine

## 2019-09-28 DIAGNOSIS — Z23 Encounter for immunization: Secondary | ICD-10-CM

## 2019-09-28 NOTE — Progress Notes (Signed)
   Covid-19 Vaccination Clinic  Name:  AFI CHESTANG    MRN: [id number] DOB: 13-Jul-1940  09/28/2019  Ms. Pizzolato was observed post Covid-19 immunization for 15 minutes without incident. She was provided with Vaccine Information Sheet and instruction to access the V-Safe system.   Ms. Lauster was instructed to call 911 with any severe reactions post vaccine: Marland Kitchen Difficulty breathing  . Swelling of face and throat  . A fast heartbeat  . A bad rash all over body  . Dizziness and weakness   Immunizations Administered    Name Date Dose VIS Date Route   Pfizer COVID-19 Vaccine 09/28/2019 12:46 PM 0.3 mL 06/12/2019 Intramuscular   Manufacturer: [place]   Lot: [id number]   [place]: [id number]

## 2019-10-13 ENCOUNTER — Emergency Department
Admission: EM | Admit: 2019-10-13 | Discharge: 2019-10-13 | Disposition: A | Discharge status: Home / Self Care | Payer: Medicare HMO | Attending: Emergency Medicine | Admitting: Emergency Medicine

## 2019-10-13 ENCOUNTER — Other Ambulatory Visit: Payer: Self-pay

## 2019-10-13 ENCOUNTER — Emergency Department: Payer: Medicare HMO

## 2019-10-13 DIAGNOSIS — I252 Old myocardial infarction: Secondary | ICD-10-CM | POA: Diagnosis not present

## 2019-10-13 DIAGNOSIS — I2510 Atherosclerotic heart disease of native coronary artery without angina pectoris: Secondary | ICD-10-CM | POA: Diagnosis not present

## 2019-10-13 DIAGNOSIS — Z20822 Contact with and (suspected) exposure to covid-19: Secondary | ICD-10-CM | POA: Insufficient documentation

## 2019-10-13 DIAGNOSIS — Z8673 Personal history of transient ischemic attack (TIA), and cerebral infarction without residual deficits: Secondary | ICD-10-CM | POA: Insufficient documentation

## 2019-10-13 DIAGNOSIS — J4521 Mild intermittent asthma with (acute) exacerbation: Secondary | ICD-10-CM

## 2019-10-13 DIAGNOSIS — I10 Essential (primary) hypertension: Secondary | ICD-10-CM | POA: Insufficient documentation

## 2019-10-13 DIAGNOSIS — Z79899 Other long term (current) drug therapy: Secondary | ICD-10-CM | POA: Diagnosis not present

## 2019-10-13 DIAGNOSIS — Z7902 Long term (current) use of antithrombotics/antiplatelets: Secondary | ICD-10-CM | POA: Diagnosis not present

## 2019-10-13 DIAGNOSIS — R0602 Shortness of breath: Secondary | ICD-10-CM | POA: Diagnosis present

## 2019-10-13 LAB — CBC WITH DIFFERENTIAL/PLATELET
Abs Immature Granulocytes: 0.02 10*3/uL (ref 0.00–0.07)
Basophils Absolute: 0 10*3/uL (ref 0.0–0.1)
Basophils Relative: 0 %
Eosinophils Absolute: 0.3 10*3/uL (ref 0.0–0.5)
Eosinophils Relative: 5 %
HCT: 42.8 % (ref 36.0–46.0)
Hemoglobin: 13.9 g/dL (ref 12.0–15.0)
Immature Granulocytes: 0 %
Lymphocytes Relative: 29 %
Lymphs Abs: 1.6 10*3/uL (ref 0.7–4.0)
MCH: 30.8 pg (ref 26.0–34.0)
MCHC: 32.5 g/dL (ref 30.0–36.0)
MCV: 94.9 fL (ref 80.0–100.0)
Monocytes Absolute: 0.4 10*3/uL (ref 0.1–1.0)
Monocytes Relative: 8 %
Neutro Abs: 3.1 10*3/uL (ref 1.7–7.7)
Neutrophils Relative %: 58 %
Platelets: 176 10*3/uL (ref 150–400)
RBC: 4.51 MIL/uL (ref 3.87–5.11)
RDW: 13.2 % (ref 11.5–15.5)
WBC: 5.5 10*3/uL (ref 4.0–10.5)
nRBC: 0 % (ref 0.0–0.2)

## 2019-10-13 LAB — COMPREHENSIVE METABOLIC PANEL
ALT: 25 U/L (ref 0–44)
AST: 26 U/L (ref 15–41)
Albumin: 4.2 g/dL (ref 3.5–5.0)
Alkaline Phosphatase: 71 U/L (ref 38–126)
Anion gap: 11 (ref 5–15)
BUN: 15 mg/dL (ref 8–23)
CO2: 26 mmol/L (ref 22–32)
Calcium: 9.1 mg/dL (ref 8.9–10.3)
Chloride: 106 mmol/L (ref 98–111)
Creatinine, Ser: 0.81 mg/dL (ref 0.44–1.00)
GFR calc Af Amer: >60 mL/min (ref >60)
GFR calc non Af Amer: >60 mL/min (ref >60)
Glucose, Bld: 99 mg/dL (ref 70–99)
Potassium: 4.1 mmol/L (ref 3.5–5.1)
Sodium: 143 mmol/L (ref 135–145)
Total Bilirubin: 1.1 mg/dL (ref 0.3–1.2)
Total Protein: 7.8 g/dL (ref 6.5–8.1)

## 2019-10-13 LAB — TROPONIN I (HIGH SENSITIVITY): Troponin I (High Sensitivity): 11 ng/L (ref <18)

## 2019-10-13 LAB — BRAIN NATRIURETIC PEPTIDE: B Natriuretic Peptide: 98 pg/mL (ref 0.0–100.0)

## 2019-10-13 LAB — RESPIRATORY PANEL BY RT PCR (FLU A&B, COVID)
Influenza A by PCR: NEGATIVE
Influenza B by PCR: NEGATIVE
SARS Coronavirus 2 by RT PCR: NEGATIVE

## 2019-10-13 MED ORDER — IPRATROPIUM-ALBUTEROL 0.5-2.5 (3) MG/3ML IN SOLN
3.0000 mL | Freq: Once | RESPIRATORY_TRACT | Status: AC
  Administered 2019-10-13: 3 mL via RESPIRATORY_TRACT
  Filled 2019-10-13: qty 3

## 2019-10-13 MED ORDER — METHYLPREDNISOLONE SODIUM SUCC 125 MG IJ SOLR
125.0000 mg | Freq: Once | INTRAMUSCULAR | Status: AC
  Administered 2019-10-13: 125 mg via INTRAVENOUS
  Filled 2019-10-13: qty 2

## 2019-10-13 MED ORDER — ALBUTEROL SULFATE (2.5 MG/3ML) 0.083% IN NEBU
2.5000 mg | INHALATION_SOLUTION | Freq: Once | RESPIRATORY_TRACT | Status: AC
  Administered 2019-10-13: 2.5 mg via RESPIRATORY_TRACT
  Filled 2019-10-13: qty 3

## 2019-10-13 MED ORDER — ALBUTEROL SULFATE HFA 108 (90 BASE) MCG/ACT IN AERS: 2.0000 | INHALATION_SPRAY | Freq: Four times a day (QID) | RESPIRATORY_TRACT | 0 refills | Status: DC | PRN

## 2019-10-13 MED ORDER — PREDNISONE 20 MG PO TABS: 40.0000 mg | ORAL_TABLET | Freq: Every day | ORAL | 0 refills | Status: AC

## 2019-10-13 MED ORDER — FEXOFENADINE HCL 180 MG PO TABS: 180.0000 mg | ORAL_TABLET | Freq: Every day | ORAL | 0 refills | Status: DC

## 2019-10-13 NOTE — Discharge Instructions (Addendum)
Use the ALBUTEROL inhaler 2 puffs every 4-6 hours for 24 hours, then as needed  Start taking the prednisone steroids  Try the allegra or other over-the-counter allergy medicine for the next month

## 2019-10-13 NOTE — ED Triage Notes (Signed)
Pt arrives via [facility] from home with c/o SOB. EMS states upon arrival the pt's O2 was 78% on RA. EMs reports audible wheezing in all four lung bases. BP-150/71, CBG-124, HR-50, CO2-38 and O2-100% on nonrebreather.  Pt placed on ED monitor and shows 96% RA. Pt does have labored breathing with some accessory muscle use.  Pt has hx of asthma

## 2019-10-13 NOTE — ED Notes (Signed)
Pt updated via Interpreter and verbalized instructions on how to use duo nebs and the plan for the pt. Pt has no questions at this time

## 2019-10-13 NOTE — ED Notes (Addendum)
Family signed esignature.  D/c inst to family and pt.  Iv dc'ed.  Pt alert.

## 2019-10-13 NOTE — ED Provider Notes (Signed)
[facility] Emergency Department Provider Note  ____________________________________________   First MD Initiated Contact with Patient 10/13/19 1221     (approximate)  I have reviewed the triage vital signs and the nursing notes.   HISTORY  Chief Complaint Shortness of Breath    HPI [name] is a 79 y.o. female  With PMHx below including HTN, HLD, asthma, here with SOB. History provided with vietnamese interpreter. She reports that over the past 10 days, she has had progressively worsening cough, wheezing, and shortness of breath. She went to her PCP and was given an antibiotic without significant relief. She states that earlier today, she was cooking in the house when she began to feel very winded with diffuse wheezing. She could not catch her breath. She subsequently presents for evaluation. Denies any chest pain. No fever. No sputum production.       Past Medical History:  Diagnosis Date  . Acoustic neuroma ([place])   . Anorexia   . Asthma   . Diastolic dysfunction    Per echo 08/2011 with moderate LVH, normal EF at 55 to 60%, & severe hypokinesis of the inferior wall. Grade 1 diastolic dysfunction  . Hydrocephalus ([place])    failed improvement with lumbar drain placement - managed by [facility] neurology; revised in March 2013 with dramatic improvement  . Hyperlipidemia   . Hypertension   . ST elevation MI (STEMI) ([place]) 08/2011   Inferior wall MI with severe diffuse CAD (90% prox LAD, 70% mid LAD, 80% prox DX1, 70% mid LCX & 100% RCA) with BMS to the RCA. Not felt to be a surgical candidate due to her poor neurologic condition with no hope for recovery. Will be managed medically  . Stroke [facility])     Patient Active Problem List   Diagnosis Date Noted  . NSTEMI (non-ST elevated myocardial infarction) ([place]) 07/15/2018  . ST elevation myocardial infarction (STEMI) of inferior wall ([place]) 08/21/2011  . CAD (coronary artery disease) 08/21/2011  . Hydrocephalus  ([place]) 08/21/2011  . Acoustic neuroma ([place]) 08/21/2011  . HTN (hypertension) 08/20/2011  . Hypercholesterolemia 08/20/2011    Past Surgical History:  Procedure Laterality Date  . CORONARY ANGIOGRAPHY N/A 07/16/2018   Procedure: CORONARY ANGIOGRAPHY;  Surgeon: Isaias Cowman, MD;  Location: [facility];  Service: Cardiovascular;  Laterality: N/A;  . CORONARY STENT PLACEMENT  08/18/2011   BMS to the RCA  . intracranial shunt  april 2013  . LEFT HEART CATH N/A 07/16/2018   Procedure: Left Heart Cath;  Surgeon: Isaias Cowman, MD;  Location: [facility];  Service: Cardiovascular;  Laterality: N/A;  . LEFT HEART CATHETERIZATION WITH CORONARY ANGIOGRAM N/A 08/18/2011   Procedure: LEFT HEART CATHETERIZATION WITH CORONARY ANGIOGRAM;  Surgeon: Peter M Martinique, MD;  Location: [facility] CATH LAB;  Service: Cardiovascular;  Laterality: N/A;  . PERCUTANEOUS CORONARY STENT INTERVENTION (PCI-S) Right 08/18/2011   Procedure: PERCUTANEOUS CORONARY STENT INTERVENTION (PCI-S);  Surgeon: Peter M Martinique, MD;  Location: [facility] CATH LAB;  Service: Cardiovascular;  Laterality: Right;    Prior to Admission medications   Medication Sig Start Date End Date Taking? Authorizing Provider  albuterol (VENTOLIN HFA) 108 (90 Base) MCG/ACT inhaler Inhale 2 puffs into the lungs every 6 (six) hours as needed for wheezing or shortness of breath. 10/13/19   Duffy Bruce, MD  aspirin 81 MG EC tablet Take 1 tablet (81 mg total) by mouth daily for 30 days. 07/17/18 08/16/18  Hillary Bow, MD  atorvastatin (LIPITOR) 40 MG  tablet Take 40 mg by mouth at bedtime. 11/22/17   [provider]  clopidogrel (PLAVIX) 75 MG tablet Take 1 tablet (75 mg total) by mouth daily. 06/09/12   Martinique, Peter M, MD  fexofenadine (ALLEGRA) 180 MG tablet Take 1 tablet (180 mg total) by mouth daily. 10/13/19 11/12/19  Duffy Bruce, MD  isosorbide mononitrate (IMDUR) 60 MG 24 hr tablet Take 1 tablet (60 mg total) by mouth daily.  07/18/18   Hillary Bow, MD  lisinopril (PRINIVIL,ZESTRIL) 2.5 MG tablet Take 1 tablet (2.5 mg total) by mouth daily. 07/17/18   Hillary Bow, MD  metoprolol tartrate (LOPRESSOR) 25 MG tablet Take 1 tablet (25 mg total) by mouth 2 (two) times daily for 30 days. 07/17/18 08/16/18  Hillary Bow, MD  nitroGLYCERIN (NITROSTAT) 0.4 MG SL tablet Place 1 tablet (0.4 mg total) under the tongue every 5 (five) minutes as needed for chest pain. 07/17/18   Hillary Bow, MD  predniSONE (DELTASONE) 20 MG tablet Take 2 tablets (40 mg total) by mouth daily for 5 days. 10/13/19 10/18/19  Duffy Bruce, MD  traMADol (ULTRAM) 50 MG tablet Take 1 tablet (50 mg total) by mouth every 6 (six) hours as needed for moderate pain or severe pain. 07/17/18   Hillary Bow, MD    Allergies Patient has no known allergies.  No family history on file.  Social History Social History   Tobacco Use  . Smoking status: Never Smoker  . Smokeless tobacco: Never Used  Substance Use Topics  . Alcohol use: No  . Drug use: No    Review of Systems  Review of Systems  Constitutional: Positive for fatigue. Negative for fever.  HENT: Negative for congestion and sore throat.   Eyes: Negative for visual disturbance.  Respiratory: Positive for cough, shortness of breath and wheezing.   Cardiovascular: Negative for chest pain.  Gastrointestinal: Negative for abdominal pain, diarrhea, nausea and vomiting.  Genitourinary: Negative for flank pain.  Musculoskeletal: Negative for back pain and neck pain.  Skin: Negative for rash and wound.  Neurological: Negative for weakness.  All other systems reviewed and are negative.    ____________________________________________  PHYSICAL EXAM:      VITAL SIGNS: ED Triage Vitals [10/13/19 1208]  Enc Vitals Group     BP 134/80     Pulse Rate (!) 54     Resp (!) 25     Temp 97.9 F (36.6 C)     Temp src      SpO2 95 %     Weight 140 lb (63.5 kg)     Height 5' (1.524 m)      Head Circumference      Peak Flow      Pain Score 0     Pain Loc      Pain Edu?      Excl. in [place]?      Physical Exam Vitals and nursing note reviewed.  Constitutional:      General: She is not in acute distress.    Appearance: She is well-developed.  HENT:     Head: Normocephalic and atraumatic.  Eyes:     Conjunctiva/sclera: Conjunctivae normal.  Cardiovascular:     Rate and Rhythm: Normal rate and regular rhythm.     Heart sounds: Normal heart sounds. No murmur. No friction rub.  Pulmonary:     Effort: Pulmonary effort is normal. No respiratory distress.     Breath sounds: Examination of the right-upper field reveals wheezing. Examination of the  left-upper field reveals wheezing. Examination of the right-middle field reveals wheezing. Examination of the left-middle field reveals wheezing. Examination of the right-lower field reveals wheezing. Examination of the left-lower field reveals wheezing. Wheezing present. No rales.  Abdominal:     General: There is no distension.     Palpations: Abdomen is soft.     Tenderness: There is no abdominal tenderness.  Musculoskeletal:     Cervical back: Neck supple.  Skin:    General: Skin is warm.     Capillary Refill: Capillary refill takes less than 2 seconds.  Neurological:     Mental Status: She is alert and oriented to person, place, and time.     Motor: No abnormal muscle tone.       ____________________________________________   LABS (all labs ordered are listed, but only abnormal results are displayed)  Labs Reviewed  RESPIRATORY PANEL BY RT PCR (FLU A&B, COVID)  CBC WITH DIFFERENTIAL/PLATELET  COMPREHENSIVE METABOLIC PANEL  BRAIN NATRIURETIC PEPTIDE  TROPONIN I (HIGH SENSITIVITY)  TROPONIN I (HIGH SENSITIVITY)    ____________________________________________  EKG: Normal sinus rhythm, VR 56. QRS 99, QTc 424. No acute ST elevation or depressions. ________________________________________  RADIOLOGY All imaging,  including plain films, CT scans, and ultrasounds, independently reviewed by me, and interpretations confirmed via formal radiology reads.  ED MD interpretation:   CXR: Clear  Official radiology report(s): DG Chest Portable 1 View  Result Date: 10/13/2019 CLINICAL DATA:  Shortness of breath. EXAM: PORTABLE CHEST 1 VIEW COMPARISON:  07/15/2018 FINDINGS: The cardiac silhouette, mediastinal and hilar contours are stable. Borderline cardiac enlargement given the AP projection and portable technique. Moderate tortuosity and calcification of the thoracic aorta. Mild chronic bronchitic changes are noted. No definite infiltrates or effusions. No pneumothorax. The bony thorax is intact. Stable ventriculoperitoneal shunt catheter coursing down the right chest. IMPRESSION: Chronic lung changes but no acute pulmonary findings. Electronically Signed   By: Marijo Sanes M.D.   On: 10/13/2019 13:24    ____________________________________________  PROCEDURES   Procedure(s) performed (including Critical Care):  .1-3 Lead EKG Interpretation Performed by: Duffy Bruce, MD Authorized by: Duffy Bruce, MD     Interpretation: normal     ECG rate:  60-70   ECG rate assessment: normal     Rhythm: sinus rhythm     Ectopy: none     Conduction: normal   Comments:     Indication: Shortness of breath    ____________________________________________  INITIAL IMPRESSION / MDM / ASSESSMENT AND PLAN / ED COURSE  As part of my medical decision making, I reviewed the following data within the [facility] notes reviewed and incorporated, Old chart reviewed, Notes from prior ED visits, and [place] Controlled Substance Database       *Ursuline Roton Belue was evaluated in Emergency Department on 10/13/2019 for the symptoms described in the history of present illness. She was evaluated in the context of the global COVID-19 pandemic, which necessitated consideration that the patient might be at risk  for infection with the SARS-CoV-2 virus that causes COVID-19. Institutional protocols and algorithms that pertain to the evaluation of patients at risk for COVID-19 are in a state of rapid change based on information released by regulatory bodies including the CDC and federal and state organizations. These policies and algorithms were followed during the patient's care in the ED.  Some ED evaluations and interventions may be delayed as a result of limited staffing during the pandemic.*     Medical Decision Making:  79 yo F here with cough, wheezing, and SOB. Suspect acute asthma/RAD exacerbation likely 2/2 pollen/environmentla allergens. Pt has had excellent response to steroids, nebulizers with complete resolution of her wheezing. Satting >95% on RA with exertion. CXR is clear. No signs of CHF. Denies any CP, and EKG is nonischemic, doubt ACS. She has a h/o similar episodes during this time of year. No signs of PNA or infectious etiology. Will treat with prednisone burst, inhaler, and good return precautions.  ____________________________________________  FINAL CLINICAL IMPRESSION(S) / ED DIAGNOSES  Final diagnoses:  Mild intermittent asthma with exacerbation     MEDICATIONS GIVEN DURING THIS VISIT:  Medications  methylPREDNISolone sodium succinate (SOLU-MEDROL) 125 mg/2 mL injection 125 mg (125 mg Intravenous Given 10/13/19 1317)  ipratropium-albuterol (DUONEB) 0.5-2.5 (3) MG/3ML nebulizer solution 3 mL (3 mLs Nebulization Given 10/13/19 1318)  ipratropium-albuterol (DUONEB) 0.5-2.5 (3) MG/3ML nebulizer solution 3 mL (3 mLs Nebulization Given 10/13/19 1318)  ipratropium-albuterol (DUONEB) 0.5-2.5 (3) MG/3ML nebulizer solution 3 mL (3 mLs Nebulization Given 10/13/19 1318)  albuterol (PROVENTIL) (2.5 MG/3ML) 0.083% nebulizer solution 2.5 mg (2.5 mg Inhalation Given 10/13/19 1521)     ED Discharge Orders         Ordered    predniSONE (DELTASONE) 20 MG tablet  Daily     10/13/19 1510     albuterol (VENTOLIN HFA) 108 (90 Base) MCG/ACT inhaler  Every 6 hours PRN     10/13/19 1510    fexofenadine (ALLEGRA) 180 MG tablet  Daily     10/13/19 1510           Note:  This document was prepared using Dragon voice recognition software and may include unintentional dictation errors.   Duffy Bruce, MD 10/13/19 2045

## 2019-10-21 ENCOUNTER — Ambulatory Visit: Payer: Medicare HMO

## 2019-10-22 ENCOUNTER — Emergency Department: Payer: Medicare HMO

## 2019-10-22 ENCOUNTER — Emergency Department
Admission: EM | Admit: 2019-10-22 | Discharge: 2019-10-22 | Disposition: A | Discharge status: Home / Self Care | Payer: Medicare HMO | Attending: Emergency Medicine | Admitting: Emergency Medicine

## 2019-10-22 ENCOUNTER — Encounter: Payer: Self-pay | Admitting: Emergency Medicine

## 2019-10-22 ENCOUNTER — Other Ambulatory Visit: Payer: Self-pay

## 2019-10-22 DIAGNOSIS — Z955 Presence of coronary angioplasty implant and graft: Secondary | ICD-10-CM | POA: Diagnosis not present

## 2019-10-22 DIAGNOSIS — I110 Hypertensive heart disease with heart failure: Secondary | ICD-10-CM | POA: Diagnosis not present

## 2019-10-22 DIAGNOSIS — Z79899 Other long term (current) drug therapy: Secondary | ICD-10-CM | POA: Insufficient documentation

## 2019-10-22 DIAGNOSIS — S53125A Posterior dislocation of left ulnohumeral joint, initial encounter: Secondary | ICD-10-CM | POA: Insufficient documentation

## 2019-10-22 DIAGNOSIS — Y939 Activity, unspecified: Secondary | ICD-10-CM | POA: Diagnosis not present

## 2019-10-22 DIAGNOSIS — I5030 Unspecified diastolic (congestive) heart failure: Secondary | ICD-10-CM | POA: Diagnosis not present

## 2019-10-22 DIAGNOSIS — D333 Benign neoplasm of cranial nerves: Secondary | ICD-10-CM | POA: Diagnosis not present

## 2019-10-22 DIAGNOSIS — J45909 Unspecified asthma, uncomplicated: Secondary | ICD-10-CM | POA: Diagnosis not present

## 2019-10-22 DIAGNOSIS — Y999 Unspecified external cause status: Secondary | ICD-10-CM | POA: Diagnosis not present

## 2019-10-22 DIAGNOSIS — I2510 Atherosclerotic heart disease of native coronary artery without angina pectoris: Secondary | ICD-10-CM | POA: Diagnosis not present

## 2019-10-22 DIAGNOSIS — S59902A Unspecified injury of left elbow, initial encounter: Secondary | ICD-10-CM | POA: Diagnosis present

## 2019-10-22 DIAGNOSIS — Y929 Unspecified place or not applicable: Secondary | ICD-10-CM | POA: Diagnosis not present

## 2019-10-22 DIAGNOSIS — Z7902 Long term (current) use of antithrombotics/antiplatelets: Secondary | ICD-10-CM | POA: Diagnosis not present

## 2019-10-22 DIAGNOSIS — S42432A Displaced fracture (avulsion) of lateral epicondyle of left humerus, initial encounter for closed fracture: Secondary | ICD-10-CM | POA: Diagnosis not present

## 2019-10-22 DIAGNOSIS — W19XXXA Unspecified fall, initial encounter: Secondary | ICD-10-CM | POA: Insufficient documentation

## 2019-10-22 MED ORDER — MORPHINE SULFATE (PF) 4 MG/ML IV SOLN
4.0000 mg | Freq: Once | INTRAVENOUS | Status: AC
  Administered 2019-10-22: 4 mg via INTRAVENOUS
  Filled 2019-10-22: qty 1

## 2019-10-22 MED ORDER — ONDANSETRON HCL 4 MG/2ML IJ SOLN
4.0000 mg | Freq: Once | INTRAMUSCULAR | Status: AC
  Administered 2019-10-22: 4 mg via INTRAVENOUS
  Filled 2019-10-22: qty 2
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
## 2019-10-22 NOTE — ED Notes (Signed)
EDP at bedside  

## 2019-10-22 NOTE — ED Provider Notes (Signed)
[facility] Emergency Department Provider Note  ____________________________________________  Time seen: Approximately 7:41 PM  I have reviewed the triage vital signs and the nursing notes.   HISTORY  Chief Complaint Elbow Pain  Encounter completed with [place] video interpreter at bedside  HPI [name] is a 79 y.o. female with a history of diastolic heart failure, CAD, hypertension who comes the ED complaining of left elbow pain due to a fall 2 days ago.  She went to primary care today who ordered an x-ray and sent her to the ED for elbow injury.  They requested CT scan to be completed after treatment of the injury.  Patient reports that the pain is moderate intensity, constant, nonradiating, worse with movement, no alleviating factors.  Denies any other injury or pain.  No head injury or loss of consciousness.  Fall was mechanical.      Past Medical History:  Diagnosis Date  . Acoustic neuroma ([place])   . Anorexia   . Asthma   . Diastolic dysfunction    Per echo 08/2011 with moderate LVH, normal EF at 55 to 60%, & severe hypokinesis of the inferior wall. Grade 1 diastolic dysfunction  . Hydrocephalus ([place])    failed improvement with lumbar drain placement - managed by [facility]; revised in March 2013 with dramatic improvement  . Hyperlipidemia   . Hypertension   . ST elevation MI (STEMI) ([place]) 08/2011   Inferior wall MI with severe diffuse CAD (90% prox LAD, 70% mid LAD, 80% prox DX1, 70% mid LCX & 100% RCA) with BMS to the RCA. Not felt to be a surgical candidate due to her poor neurologic condition with no hope for recovery. Will be managed medically  . Stroke [facility])      Patient Active Problem List   Diagnosis Date Noted  . NSTEMI (non-ST elevated myocardial infarction) ([place]) 07/15/2018  . ST elevation myocardial infarction (STEMI) of inferior wall ([place]) 08/21/2011  . CAD (coronary artery disease) 08/21/2011  . Hydrocephalus ([place])  08/21/2011  . Acoustic neuroma ([employer]) 08/21/2011  . HTN (hypertension) 08/20/2011  . Hypercholesterolemia 08/20/2011     Past Surgical History:  Procedure Laterality Date  . CORONARY ANGIOGRAPHY N/A 07/16/2018   Procedure: CORONARY ANGIOGRAPHY;  Surgeon: Isaias Cowman, MD;  Location: [facility] CV LAB;  Service: Cardiovascular;  Laterality: N/A;  . CORONARY STENT PLACEMENT  08/18/2011   BMS to the RCA  . intracranial shunt  april 2013  . LEFT HEART CATH N/A 07/16/2018   Procedure: Left Heart Cath;  Surgeon: Isaias Cowman, MD;  Location: [facility] LAB;  Service: Cardiovascular;  Laterality: N/A;  . LEFT HEART CATHETERIZATION WITH CORONARY ANGIOGRAM N/A 08/18/2011   Procedure: LEFT HEART CATHETERIZATION WITH CORONARY ANGIOGRAM;  Surgeon: Peter M Martinique, MD;  Location: [facility] C[facility] LAB;  Service: Cardiovascular;  Laterality: N/A;  . PERCUTANEOUS CORONARY STENT INTERVENTION (PCI-S) Right 08/18/2011   Procedure: PERCUTANEOUS CORONARY STENT INTERVENTION (PCI-S);  Surgeon: Peter M Martinique, MD;  Location: [facility] CATH LAB;  Service: Cardiovascular;  Laterality: Right;     Prior to Admission medications   Medication Sig Start Date End Date Taking? Authorizing Provider  albuterol (VENTOLIN HFA) 108 (90 Base) MCG/ACT inhaler Inhale 2 puffs into the lungs every 6 (six) hours as needed for wheezing or shortness of breath. 10/13/19   Duffy Bruce, MD  aspirin 81 MG EC tablet Take 1 tablet (81 mg total) by mouth daily for 30 days. 07/17/18 08/16/18  Hillary Bow, MD  atorvastatin (  LIPITOR) 40 MG tablet Take 40 mg by mouth at bedtime. 11/22/17   [provider]  clopidogrel (PLAVIX) 75 MG tablet Take 1 tablet (75 mg total) by mouth daily. 06/09/12   Martinique, Peter M, MD  fexofenadine (ALLEGRA) 180 MG tablet Take 1 tablet (180 mg total) by mouth daily. 10/13/19 11/12/19  Duffy Bruce, MD  isosorbide mononitrate (IMDUR) 60 MG 24 hr tablet Take 1 tablet (60 mg total) by mouth daily.  07/18/18   Hillary Bow, MD  lisinopril (PRINIVIL,ZESTRIL) 2.5 MG tablet Take 1 tablet (2.5 mg total) by mouth daily. 07/17/18   Hillary Bow, MD  metoprolol tartrate (LOPRESSOR) 25 MG tablet Take 1 tablet (25 mg total) by mouth 2 (two) times daily for 30 days. 07/17/18 08/16/18  Hillary Bow, MD  nitroGLYCERIN (NITROSTAT) 0.4 MG SL tablet Place 1 tablet (0.4 mg total) under the tongue every 5 (five) minutes as needed for chest pain. 07/17/18   Hillary Bow, MD  traMADol (ULTRAM) 50 MG tablet Take 1 tablet (50 mg total) by mouth every 6 (six) hours as needed for moderate pain or severe pain. 07/17/18   Hillary Bow, MD     Allergies Patient has no known allergies.   No family history on file.  Social History Social History   Tobacco Use  . Smoking status: Never Smoker  . Smokeless tobacco: Never Used  Substance Use Topics  . Alcohol use: No  . Drug use: No    Review of Systems  Constitutional:   No fever or chills.  ENT:   No sore throat. No rhinorrhea. Cardiovascular:   No chest pain or syncope. Respiratory:   No dyspnea or cough. Gastrointestinal:   Negative for abdominal pain, vomiting and diarrhea.  Musculoskeletal:   Left elbow pain as above All other systems reviewed and are negative except as documented above in ROS and HPI.  ____________________________________________   PHYSICAL EXAM:  VITAL SIGNS: ED Triage Vitals  Enc Vitals Group     BP 10/22/19 1420 (!) 169/85     Pulse Rate 10/22/19 1420 63     Resp 10/22/19 1420 16     Temp 10/22/19 1420 98.5 F (36.9 C)     Temp Source 10/22/19 1420 Oral     SpO2 10/22/19 1420 96 %     Weight 10/22/19 1412 150 lb (68 kg)     Height 10/22/19 1412 5' (1.524 m)     Head Circumference --      Peak Flow --      Pain Score 10/22/19 1412 6     Pain Loc --      Pain Edu? --      Excl. in [place]? --     Vital signs reviewed, nursing assessments reviewed.   Constitutional:   Alert and oriented. Non-toxic  appearance. Eyes:   Conjunctivae are normal. EOMI. PERRL. ENT      Head:   Normocephalic and atraumatic.      Nose:   Wearing a mask.      Mouth/Throat:   Wearing a mask.      Neck:   No meningismus. Full ROM. Hematological/Lymphatic/Immunilogical:   No cervical lymphadenopathy. Cardiovascular:   RRR. Symmetric bilateral radial and DP pulses.  No murmurs. Cap refill less than 2 seconds. Respiratory:   Normal respiratory effort without tachypnea/retractions. Breath sounds are clear and equal bilaterally. No wheezes/rales/rhonchi. Gastrointestinal:   Soft and nontender. Non distended. There is no CVA tenderness.  No rebound, rigidity, or guarding.  Musculoskeletal:  Left elbow with deformity, unable to range and pain with attempted motion.  Normal range of motion in all other extremities.  No long bone deformities in the left upper extremity, normal muscle strength and movement in the left hand.. Neurologic:   Normal speech and language.  Motor grossly intact. Normal motor and sensory function throughout all distributions in the left hand.  Radial, ulnar, median nerve intact. No acute focal neurologic deficits are appreciated.  Skin:    Skin is warm, dry and intact. No rash noted.  No petechiae, purpura, or bullae.  ____________________________________________    LABS (pertinent positives/negatives) (all labs ordered are listed, but only abnormal results are displayed) Labs Reviewed - No data to display ____________________________________________   EKG    ____________________________________________    RADIOLOGY  DG Forearm Left  Result Date: 10/22/2019 CLINICAL DATA:  Left forearm pain since a fall 2 days ago. Initial encounter. EXAM: LEFT FOREARM - 2 VIEW COMPARISON:  None. FINDINGS: The elbow is posteriorly dislocated. Fracture fragment is seen off of the lateral epicondyle of the humerus. Soft tissue swelling is present about the elbow. IMPRESSION: Posteriorly dislocated  left elbow. Fracture off the lateral epicondyle of the humerus is noted. Recommend repeat elbow imaging after reduction of dislocation. Electronically Signed   By: Inge Rise M.D.   On: 10/22/2019 15:20   CT Elbow Left Wo Contrast  Result Date: 10/22/2019 CLINICAL DATA:  Left elbow dislocation status post reduction, lateral humeral epicondylar fracture EXAM: CT OF THE UPPER LEFT EXTREMITY WITHOUT CONTRAST TECHNIQUE: Multidetector CT imaging of the upper left extremity was performed according to the standard protocol. COMPARISON:  10/22/2019 FINDINGS: Bones/Joint/Cartilage Since the prior exam, the elbow dislocation has been reduced with anatomic alignment. Mildly comminuted avulsion fracture of the lateral humeral epicondyle is noted, with approximately 4 mm of distraction of the fracture fragment. There are 2 small ossific densities adjacent to the medial humeral epicondyle, without clear donor sites identified, suspicious for small avulsion fractures. I do not see any radial or ulnar fractures. Ligaments Suboptimally assessed by CT. Muscles and Tendons No gross abnormality on this unenhanced exam. Soft tissues There is diffuse subcutaneous edema surrounding the left elbow. No fluid collections. IMPRESSION: 1. Anatomic alignment of the left elbow after reduction. 2. Mildly comminuted displaced avulsion fracture off of the lateral humeral epicondyle, corresponding to x-ray findings. 3. Punctate ossific densities adjacent to the medial humeral epicondyle consistent with avulsion fractures. Donor sites not well visualized. 4. Extensive soft tissue edema. Electronically Signed   By: Randa Ngo M.D.   On: 10/22/2019 17:40    ____________________________________________   PROCEDURES .Ortho Injury Treatment  Date/Time: 10/22/2019 7:45 PM Performed by: Carrie Mew, MD Authorized by: Carrie Mew, MD   Consent:    Consent obtained:  Verbal   Consent given by:  Patient   Risks  discussed:  Fracture, nerve damage, irreducible dislocation and recurrent dislocation   Alternatives discussed:  Referral and immobilizationInjury location: elbow Location details: left elbow Injury type: dislocation Dislocation type: posterior Pre-procedure neurovascular assessment: neurovascularly intact Pre-procedure distal perfusion: normal Pre-procedure neurological function: normal Pre-procedure range of motion: reduced  Anesthesia: Local anesthesia used: no  Patient sedated: NoManipulation performed: yes Reduction method: traction and counter traction Reduction successful: yes X-ray confirmed reduction: yes (CT scan) Immobilization: splint and sling Splint type: long arm Supplies used: Ortho-Glass,  cotton padding and elastic bandage Post-procedure neurovascular assessment: post-procedure neurovascularly intact Post-procedure distal perfusion: normal Post-procedure neurological function: normal Post-procedure range of motion: improved Patient tolerance:  patient tolerated the procedure well with no immediate complications     ____________________________________________    CLINICAL IMPRESSION / ASSESSMENT AND PLAN / ED COURSE  Medications ordered in the ED: Medications  morphine 4 MG/ML injection 4 mg (4 mg Intravenous Given 10/22/19 1626)  ondansetron (ZOFRAN) injection 4 mg (4 mg Intravenous Given 10/22/19 1626)    Pertinent labs & imaging results that were available during my care of the patient were reviewed by me and considered in my medical decision making (see chart for details).  [name] was evaluated in Emergency Department on 10/22/2019 for the symptoms described in the history of present illness. She was evaluated in the context of the global COVID-19 pandemic, which necessitated consideration that the patient might be at risk for infection with the SARS-CoV-2 virus that causes COVID-19. Institutional protocols and algorithms that pertain to the  evaluation of patients at risk for COVID-19 are in a state of rapid change based on information released by regulatory bodies including the CDC and federal and state organizations. These policies and algorithms were followed during the patient's care in the ED.   Patient presents with left elbow pain, x-ray image viewed by me and radiology report reviewed, showing posterior dislocation of the left elbow.  Give patient 4 mg of IV morphine for pain control and attempt to manipulate.  Patient provides verbal consent for manipulation understanding risks of fracture, nerve injury, failed reduction, and further consents for deep sedation if required.   Clinical Course as of Oct 22 1939  Thu Oct 22, 2019  1645 Elbow easily reduced after pain control using traction/counter-traction. Will obtain CT   [PS]    Clinical Course User Index [PS] Carrie Mew, MD     ----------------------------------------- 7:44 PM on 10/22/2019 -----------------------------------------  CT results reviewed, showing avulsion fracture at the humeral epicondyle.  Injury discussed with orthopedics Dr. Mack Guise who recommend she call the orthopedic clinic tomorrow for follow-up appointment scheduling with hand specialist Dr. Peggye Ley.  He agrees with splint and sling for now.  I have counseled the patient using the video interpreter on rice therapy, splint care, warning signs for compartment syndrome, and pain management.  ____________________________________________   FINAL CLINICAL IMPRESSION(S) / ED DIAGNOSES    Final diagnoses:  Posterior dislocation of elbow, closed, left, initial encounter  Closed displaced avulsion fracture of lateral epicondyle of left humerus, initial encounter     ED Discharge Orders    None      Portions of this note were generated with dragon dictation software. Dictation errors may occur despite best attempts at proofreading.   Carrie Mew, MD 10/22/19 1946

## 2019-10-22 NOTE — Discharge Instructions (Signed)
Your elbow dislocation was fixed in the ER today.  Keep the splint and sling on your arm at all times to protect it and decrease pain.  You should call the orthopedic clinic tomorrow morning for a follow up appointment with  Dr. Peggye Ley.  Return to the ER right away if you develop left forearm or hand numbness, coldness, weakness, discoloration, or severe pain.  Do not allow the splint to get wet or else it will become soft.  Keep the arm elevated on pillows when possible, and place ice on the left elbow on and off over the next 2 days.  Take naproxen twice a day and tylenol 3-4 times a day as needed for pain control.

## 2022-01-11 ENCOUNTER — Emergency Department
Admission: EM | Admit: 2022-01-11 | Discharge: 2022-01-12 | Disposition: A | Discharge status: Home / Self Care | Payer: Medicare HMO | Attending: Emergency Medicine | Admitting: Emergency Medicine

## 2022-01-11 ENCOUNTER — Emergency Department: Payer: Medicare HMO

## 2022-01-11 DIAGNOSIS — J45901 Unspecified asthma with (acute) exacerbation: Secondary | ICD-10-CM | POA: Insufficient documentation

## 2022-01-11 DIAGNOSIS — Z20822 Contact with and (suspected) exposure to covid-19: Secondary | ICD-10-CM | POA: Insufficient documentation

## 2022-01-11 DIAGNOSIS — R0602 Shortness of breath: Secondary | ICD-10-CM | POA: Diagnosis present

## 2022-01-11 DIAGNOSIS — I5030 Unspecified diastolic (congestive) heart failure: Secondary | ICD-10-CM | POA: Insufficient documentation

## 2022-01-11 DIAGNOSIS — I110 Hypertensive heart disease with heart failure: Secondary | ICD-10-CM | POA: Insufficient documentation

## 2022-01-11 DIAGNOSIS — I2510 Atherosclerotic heart disease of native coronary artery without angina pectoris: Secondary | ICD-10-CM | POA: Insufficient documentation

## 2022-01-11 LAB — CBC WITH DIFFERENTIAL/PLATELET
Abs Immature Granulocytes: 0.02 10*3/uL (ref 0.00–0.07)
Basophils Absolute: 0 10*3/uL (ref 0.0–0.1)
Basophils Relative: 0 %
Eosinophils Absolute: 0.3 10*3/uL (ref 0.0–0.5)
Eosinophils Relative: 3 %
HCT: 40.2 % (ref 36.0–46.0)
Hemoglobin: 12.8 g/dL (ref 12.0–15.0)
Immature Granulocytes: 0 %
Lymphocytes Relative: 41 %
Lymphs Abs: 3.8 10*3/uL (ref 0.7–4.0)
MCH: 31.1 pg (ref 26.0–34.0)
MCHC: 31.8 g/dL (ref 30.0–36.0)
MCV: 97.8 fL (ref 80.0–100.0)
Monocytes Absolute: 0.6 10*3/uL (ref 0.1–1.0)
Monocytes Relative: 6 %
Neutro Abs: 4.5 10*3/uL (ref 1.7–7.7)
Neutrophils Relative %: 50 %
Platelets: 189 10*3/uL (ref 150–400)
RBC: 4.11 MIL/uL (ref 3.87–5.11)
RDW: 12.4 % (ref 11.5–15.5)
WBC: 9.2 10*3/uL (ref 4.0–10.5)
nRBC: 0 % (ref 0.0–0.2)

## 2022-01-11 MED ORDER — IPRATROPIUM-ALBUTEROL 0.5-2.5 (3) MG/3ML IN SOLN
6.0000 mL | Freq: Once | RESPIRATORY_TRACT | Status: AC
  Administered 2022-01-12: 6 mL via RESPIRATORY_TRACT
  Filled 2022-01-11: qty 3

## 2022-01-11 MED ORDER — IPRATROPIUM-ALBUTEROL 0.5-2.5 (3) MG/3ML IN SOLN: 3.0000 mL | Freq: Once | RESPIRATORY_TRACT | Status: DC

## 2022-01-11 NOTE — ED Provider Notes (Signed)
[facility] Provider Note    Event Date/Time   First MD Initiated Contact with Patient 01/11/22 2303     (approximate)   History   Chief Complaint Shortness of Breath (SOB for one month, hypertensive)   HPI  [name] is a 81 y.o. female with past medical history of hypertension, hyperlipidemia, CAD, diastolic CHF, and asthma who presents to the ED complaining of shortness of breath.  History is limited as patient is [place] speaking only, history obtained via interpreter [phone].  Patient reports that she has been feeling increasingly short of breath for about the past 2 weeks with significant progression over the past 1 to 2 days.  Symptoms have been associated with productive cough, but she denies any fevers or pain in her chest.  She has not had any abdominal pain, nausea, vomiting, diarrhea, dysuria, or pain or swelling in her legs.  She reports a history of asthma but states she does not have an inhaler or other medications for that home to use.  EMS was called this evening and found patient significantly short of breath with wheezing.  She was given 125 mg IV Solu-Medrol, 2 g of IV magnesium, and a DuoNeb with EMS, now states that her symptoms are improved.     Physical Exam   Triage Vital Signs: ED Triage Vitals  Enc Vitals Group     BP 01/11/22 2311 (!) 190/66     Pulse Rate 01/11/22 2311 98     Resp 01/11/22 2311 19     Temp 01/11/22 2311 97.8 F (36.6 C)     Temp Source 01/11/22 2311 Oral     SpO2 01/11/22 2311 99 %     Weight 01/11/22 2309 150 lb (68 kg)     Height --      Head Circumference --      Peak Flow --      Pain Score 01/11/22 2309 0     Pain Loc --      Pain Edu? --      Excl. in [place]? --     Most recent vital signs: Vitals:   01/12/22 0015 01/12/22 0100  BP: (!) 170/77 (!) 160/75  Pulse: (!) 55 (!) 52  Resp: 19 19  Temp:    SpO2: 96% 96%    Constitutional: Alert and oriented. Eyes: Conjunctivae are  normal. Head: Atraumatic. Nose: No congestion/rhinnorhea. Mouth/Throat: Mucous membranes are moist.  Cardiovascular: Normal rate, regular rhythm. Grossly normal heart sounds.  2+ radial pulses bilaterally. Respiratory: Tachypneic with mildly increased work of breathing, expiratory wheezing noted throughout. Gastrointestinal: Soft and nontender. No distention. Musculoskeletal: No lower extremity tenderness nor edema.  Neurologic:  Normal speech and language. No gross focal neurologic deficits are appreciated.    ED Results / Procedures / Treatments   Labs (all labs ordered are listed, but only abnormal results are displayed) Labs Reviewed  BASIC METABOLIC PANEL - Abnormal; Notable for the following components:      Result Value   Glucose, Bld 179 (*)    BUN 26 (*)    Creatinine, Ser 1.11 (*)    Calcium 8.8 (*)    GFR, Estimated 51 (*)    All other components within normal limits  SARS CORONAVIRUS 2 BY RT PCR  CBC WITH DIFFERENTIAL/PLATELET  BRAIN NATRIURETIC PEPTIDE  BLOOD GAS, VENOUS  TROPONIN I (HIGH SENSITIVITY)     EKG  ED ECG REPORT I, Blake Divine, the attending physician, personally viewed and  interpreted this ECG.   Date: 01/11/2022  EKG Time: 23:06  Rate: 63  Rhythm: normal sinus rhythm  Axis: Normal  Intervals:none  ST&T Change: None  RADIOLOGY Chest x-ray reviewed and interpreted by me with no infiltrate, edema, or effusion.  PROCEDURES:  Critical Care performed: No  Procedures   MEDICATIONS ORDERED IN ED: Medications  ipratropium-albuterol (DUONEB) 0.5-2.5 (3) MG/3ML nebulizer solution 6 mL ( Nebulization Not Given 01/12/22 0010)     IMPRESSION / MDM / ASSESSMENT AND PLAN / ED COURSE  I reviewed the triage vital signs and the nursing notes.                              81 y.o. female with past medical history of hypertension, hyperlipidemia, CAD, diastolic CHF, and asthma who presents to the ED complaining of cough with increasing  difficulty breathing over the past 2 weeks, acutely worse over the past 2 days.  Patient's presentation is most consistent with acute presentation with potential threat to life or bodily function.  Differential diagnosis includes, but is not limited to, asthma exacerbation, COPD, CHF, ACS, PE, pneumonia, COVID-19, bronchitis.  Patient nontoxic-appearing and in no acute distress, does have slightly increased work of breathing but is maintaining oxygen saturations on room air.  Lungs with significant expiratory wheezing and symptoms seem consistent with an asthma exacerbation.  She was given IV Solu-Medrol and magnesium prior to arrival along with 1 DuoNeb, we will give 2 additional DuoNebs here in the ED.  EKG shows no evidence of arrhythmia or ischemia, low suspicion for ACS or PE at this time.  We will further assess with labs and chest x-ray, reassess following additional breathing treatments.  Patient reports feeling much better following additional breathing treatments, states her breathing is back to normal and wheezing has resolved on reexamination.  Chest x-ray is unremarkable and labs are also reassuring, troponin within normal limits.  Additional labs show no significant anemia, leukocytosis, AKI, or electrolyte abnormality.  BNP within normal limits and no findings to suggest, ACS, PE, or CHF.  Patient is appropriate for discharge home with PCP follow-up, will be prescribed albuterol and course of prednisone.  She was counseled to return to the ED for new or worsening symptoms, patient and son agree with plan.      FINAL CLINICAL IMPRESSION(S) / ED DIAGNOSES   Final diagnoses:  Exacerbation of asthma, unspecified asthma severity, unspecified whether persistent     Rx / DC Orders   ED Discharge Orders          Ordered    albuterol (VENTOLIN HFA) 108 (90 Base) MCG/ACT inhaler  Every 6 hours PRN       Note to Pharmacy: Please supply with spacer   01/12/22 0133    predniSONE  (DELTASONE) 20 MG tablet  Daily with breakfast        01/12/22 0133             Note:  This document was prepared using Dragon voice recognition software and may include unintentional dictation errors.   Blake Divine, MD 01/12/22 [phone]

## 2022-01-12 DIAGNOSIS — J45901 Unspecified asthma with (acute) exacerbation: Secondary | ICD-10-CM | POA: Diagnosis not present

## 2022-01-12 LAB — BASIC METABOLIC PANEL
Anion gap: 6 (ref 5–15)
BUN: 26 mg/dL — ABNORMAL HIGH (ref 8–23)
CO2: 27 mmol/L (ref 22–32)
Calcium: 8.8 mg/dL — ABNORMAL LOW (ref 8.9–10.3)
Chloride: 106 mmol/L (ref 98–111)
Creatinine, Ser: 1.11 mg/dL — ABNORMAL HIGH (ref 0.44–1.00)
GFR, Estimated: 51 mL/min — ABNORMAL LOW (ref >60)
Glucose, Bld: 179 mg/dL — ABNORMAL HIGH (ref 70–99)
Potassium: 3.5 mmol/L (ref 3.5–5.1)
Sodium: 139 mmol/L (ref 135–145)

## 2022-01-12 LAB — TROPONIN I (HIGH SENSITIVITY): Troponin I (High Sensitivity): 12 ng/L (ref <18)

## 2022-01-12 LAB — SARS CORONAVIRUS 2 BY RT PCR: SARS Coronavirus 2 by RT PCR: NEGATIVE

## 2022-01-12 LAB — BLOOD GAS, VENOUS
Acid-Base Excess: 3.6 mmol/L — ABNORMAL HIGH (ref 0.0–2.0)
Bicarbonate: 30.1 mmol/L — ABNORMAL HIGH (ref 20.0–28.0)
O2 Saturation: 73.2 %
Patient temperature: 37
pCO2, Ven: 52 mmHg (ref 44–60)
pH, Ven: 7.37 (ref 7.25–7.43)
pO2, Ven: 44 mmHg (ref 32–45)

## 2022-01-12 LAB — BRAIN NATRIURETIC PEPTIDE: B Natriuretic Peptide: 70.2 pg/mL (ref 0.0–100.0)

## 2022-01-12 MED ORDER — PREDNISONE 20 MG PO TABS: 60.0000 mg | ORAL_TABLET | Freq: Every day | ORAL | 0 refills | Status: AC

## 2022-01-12 MED ORDER — ALBUTEROL SULFATE HFA 108 (90 BASE) MCG/ACT IN AERS: 2.0000 | INHALATION_SPRAY | Freq: Four times a day (QID) | RESPIRATORY_TRACT | 0 refills | Status: DC | PRN

## 2022-01-12 MED ORDER — IPRATROPIUM-ALBUTEROL 0.5-2.5 (3) MG/3ML IN SOLN
RESPIRATORY_TRACT | Status: AC
  Filled 2022-01-12: qty 3

## 2022-09-01 ENCOUNTER — Emergency Department: Payer: Medicare Other

## 2022-09-01 ENCOUNTER — Other Ambulatory Visit: Payer: Self-pay

## 2022-09-01 ENCOUNTER — Inpatient Hospital Stay
Admission: EM | Admit: 2022-09-01 | Discharge: 2022-09-14 | DRG: 065 | Disposition: A | Discharge status: Skilled Nursing Facility | Payer: Medicare Other | Attending: Internal Medicine | Admitting: Internal Medicine

## 2022-09-01 ENCOUNTER — Encounter: Payer: Self-pay | Admitting: Emergency Medicine

## 2022-09-01 DIAGNOSIS — I639 Cerebral infarction, unspecified: Secondary | ICD-10-CM | POA: Diagnosis present

## 2022-09-01 DIAGNOSIS — I161 Hypertensive emergency: Secondary | ICD-10-CM | POA: Insufficient documentation

## 2022-09-01 DIAGNOSIS — N390 Urinary tract infection, site not specified: Secondary | ICD-10-CM | POA: Diagnosis not present

## 2022-09-01 DIAGNOSIS — G919 Hydrocephalus, unspecified: Secondary | ICD-10-CM | POA: Diagnosis present

## 2022-09-01 DIAGNOSIS — I110 Hypertensive heart disease with heart failure: Secondary | ICD-10-CM | POA: Diagnosis present

## 2022-09-01 DIAGNOSIS — Z7982 Long term (current) use of aspirin: Secondary | ICD-10-CM | POA: Diagnosis not present

## 2022-09-01 DIAGNOSIS — Z7902 Long term (current) use of antithrombotics/antiplatelets: Secondary | ICD-10-CM | POA: Diagnosis not present

## 2022-09-01 DIAGNOSIS — Z982 Presence of cerebrospinal fluid drainage device: Secondary | ICD-10-CM | POA: Diagnosis not present

## 2022-09-01 DIAGNOSIS — I2510 Atherosclerotic heart disease of native coronary artery without angina pectoris: Secondary | ICD-10-CM | POA: Diagnosis present

## 2022-09-01 DIAGNOSIS — I252 Old myocardial infarction: Secondary | ICD-10-CM | POA: Diagnosis not present

## 2022-09-01 DIAGNOSIS — I6381 Other cerebral infarction due to occlusion or stenosis of small artery: Secondary | ICD-10-CM | POA: Diagnosis not present

## 2022-09-01 DIAGNOSIS — I6389 Other cerebral infarction: Secondary | ICD-10-CM | POA: Diagnosis not present

## 2022-09-01 DIAGNOSIS — R29704 NIHSS score 4: Secondary | ICD-10-CM | POA: Diagnosis present

## 2022-09-01 DIAGNOSIS — Z9889 Other specified postprocedural states: Secondary | ICD-10-CM

## 2022-09-01 DIAGNOSIS — Z79899 Other long term (current) drug therapy: Secondary | ICD-10-CM | POA: Diagnosis not present

## 2022-09-01 DIAGNOSIS — Z86011 Personal history of benign neoplasm of the brain: Secondary | ICD-10-CM

## 2022-09-01 DIAGNOSIS — J45909 Unspecified asthma, uncomplicated: Secondary | ICD-10-CM | POA: Diagnosis present

## 2022-09-01 DIAGNOSIS — I5032 Chronic diastolic (congestive) heart failure: Secondary | ICD-10-CM | POA: Diagnosis present

## 2022-09-01 DIAGNOSIS — R296 Repeated falls: Secondary | ICD-10-CM | POA: Diagnosis present

## 2022-09-01 DIAGNOSIS — I6523 Occlusion and stenosis of bilateral carotid arteries: Secondary | ICD-10-CM | POA: Diagnosis present

## 2022-09-01 DIAGNOSIS — R29709 NIHSS score 9: Secondary | ICD-10-CM | POA: Diagnosis not present

## 2022-09-01 DIAGNOSIS — R4701 Aphasia: Secondary | ICD-10-CM | POA: Diagnosis present

## 2022-09-01 DIAGNOSIS — Z955 Presence of coronary angioplasty implant and graft: Secondary | ICD-10-CM | POA: Diagnosis not present

## 2022-09-01 DIAGNOSIS — Z86018 Personal history of other benign neoplasm: Secondary | ICD-10-CM

## 2022-09-01 DIAGNOSIS — E785 Hyperlipidemia, unspecified: Secondary | ICD-10-CM | POA: Diagnosis present

## 2022-09-01 LAB — URINALYSIS, ROUTINE W REFLEX MICROSCOPIC
Bacteria, UA: NONE SEEN
Bilirubin Urine: NEGATIVE
Glucose, UA: NEGATIVE mg/dL
Ketones, ur: NEGATIVE mg/dL
Leukocytes,Ua: NEGATIVE
Nitrite: NEGATIVE
Protein, ur: 100 mg/dL — AB
Specific Gravity, Urine: 1.013 (ref 1.005–1.030)
Squamous Epithelial / HPF: NONE SEEN /HPF (ref 0–5)
pH: 7 (ref 5.0–8.0)

## 2022-09-01 LAB — BASIC METABOLIC PANEL
Anion gap: 9 (ref 5–15)
BUN: 25 mg/dL — ABNORMAL HIGH (ref 8–23)
CO2: 28 mmol/L (ref 22–32)
Calcium: 9 mg/dL (ref 8.9–10.3)
Chloride: 102 mmol/L (ref 98–111)
Creatinine, Ser: 0.97 mg/dL (ref 0.44–1.00)
GFR, Estimated: 59 mL/min — ABNORMAL LOW (ref >60)
Glucose, Bld: 120 mg/dL — ABNORMAL HIGH (ref 70–99)
Potassium: 4 mmol/L (ref 3.5–5.1)
Sodium: 139 mmol/L (ref 135–145)

## 2022-09-01 LAB — CBC
HCT: 43.8 % (ref 36.0–46.0)
Hemoglobin: 14.3 g/dL (ref 12.0–15.0)
MCH: 30.8 pg (ref 26.0–34.0)
MCHC: 32.6 g/dL (ref 30.0–36.0)
MCV: 94.2 fL (ref 80.0–100.0)
Platelets: 194 10*3/uL (ref 150–400)
RBC: 4.65 MIL/uL (ref 3.87–5.11)
RDW: 12.6 % (ref 11.5–15.5)
WBC: 7.9 10*3/uL (ref 4.0–10.5)
nRBC: 0 % (ref 0.0–0.2)

## 2022-09-01 MED ORDER — NITROGLYCERIN 0.4 MG SL SUBL: 0.4000 mg | SUBLINGUAL_TABLET | SUBLINGUAL | Status: DC | PRN

## 2022-09-01 MED ORDER — ACETAMINOPHEN 325 MG PO TABS
650.0000 mg | ORAL_TABLET | ORAL | Status: DC | PRN
  Administered 2022-09-04: 650 mg via ORAL
  Filled 2022-09-01: qty 2

## 2022-09-01 MED ORDER — ALBUTEROL SULFATE (2.5 MG/3ML) 0.083% IN NEBU: 2.5000 mg | INHALATION_SOLUTION | Freq: Four times a day (QID) | RESPIRATORY_TRACT | Status: DC | PRN

## 2022-09-01 MED ORDER — ASPIRIN 81 MG PO TBEC
81.0000 mg | DELAYED_RELEASE_TABLET | Freq: Every day | ORAL | Status: DC
Start: 2022-09-02 — End: 2022-09-14
  Administered 2022-09-02 – 2022-09-14 (×13): 81 mg via ORAL
  Filled 2022-09-01 (×13): qty 1

## 2022-09-01 MED ORDER — ASPIRIN 81 MG PO CHEW
324.0000 mg | CHEWABLE_TABLET | Freq: Once | ORAL | Status: AC
  Administered 2022-09-01: 324 mg via ORAL
  Filled 2022-09-01: qty 4

## 2022-09-01 MED ORDER — ACETAMINOPHEN 160 MG/5ML PO SOLN: 650.0000 mg | ORAL | Status: DC | PRN

## 2022-09-01 MED ORDER — STROKE: EARLY STAGES OF RECOVERY BOOK: Freq: Once | Status: AC

## 2022-09-01 MED ORDER — ATORVASTATIN CALCIUM 20 MG PO TABS
80.0000 mg | ORAL_TABLET | Freq: Every day | ORAL | Status: DC
  Administered 2022-09-02 – 2022-09-14 (×13): 80 mg via ORAL
  Filled 2022-09-01 (×3): qty 4
  Filled 2022-09-01 (×3): qty 1
  Filled 2022-09-01: qty 4
  Filled 2022-09-01 (×4): qty 1
  Filled 2022-09-01 (×2): qty 4

## 2022-09-01 MED ORDER — SODIUM CHLORIDE 0.9 % IV SOLN: INTRAVENOUS | Status: AC

## 2022-09-01 MED ORDER — LISINOPRIL 5 MG PO TABS
2.5000 mg | ORAL_TABLET | Freq: Once | ORAL | Status: AC
  Administered 2022-09-01: 2.5 mg via ORAL
  Filled 2022-09-01: qty 1

## 2022-09-01 MED ORDER — ENOXAPARIN SODIUM 40 MG/0.4ML IJ SOSY
40.0000 mg | PREFILLED_SYRINGE | INTRAMUSCULAR | Status: DC
  Administered 2022-09-02 – 2022-09-13 (×13): 40 mg via SUBCUTANEOUS
  Filled 2022-09-01 (×13): qty 0.4

## 2022-09-01 MED ORDER — LISINOPRIL 5 MG PO TABS
2.5000 mg | ORAL_TABLET | Freq: Every day | ORAL | Status: DC
  Administered 2022-09-02 – 2022-09-03 (×2): 2.5 mg via ORAL
  Filled 2022-09-01 (×2): qty 1

## 2022-09-01 MED ORDER — ACETAMINOPHEN 650 MG RE SUPP: 650.0000 mg | RECTAL | Status: DC | PRN

## 2022-09-01 MED ORDER — CLOPIDOGREL BISULFATE 75 MG PO TABS
75.0000 mg | ORAL_TABLET | Freq: Every day | ORAL | Status: DC
  Administered 2022-09-02 – 2022-09-14 (×13): 75 mg via ORAL
  Filled 2022-09-01 (×13): qty 1

## 2022-09-01 MED ORDER — METOPROLOL TARTRATE 25 MG PO TABS
25.0000 mg | ORAL_TABLET | Freq: Two times a day (BID) | ORAL | Status: DC
  Administered 2022-09-02 – 2022-09-07 (×13): 25 mg via ORAL
  Filled 2022-09-01 (×14): qty 1

## 2022-09-01 NOTE — Assessment & Plan Note (Deleted)
CT shows :acute versus subacute medial left thalamic infarct. Equivocal smaller infarct in the medial right thalamus. Stroke likely happened 3 days prior given onset of symptoms followed in fall 3 days prior Blood pressure control  statins for LDL goal less than 70 ASA '81mg'$  daily, Plavix '75mg'$  daily x 3 weeks then monotherapy thereafter Telemetry, echocardiogram, carotid Doppler Physical therapy/Occupational therapy/Speech therapy if failed dysphagia screen Neurology consult to follow

## 2022-09-01 NOTE — Assessment & Plan Note (Signed)
SBP 220 with EMS and 190 on arrival Given patient is 3 days out from stroke will get BP control to systolic under [id number] Patient reportedly was not taking medication Continue lisinopril, metoprolol

## 2022-09-01 NOTE — H&P (Incomplete)
History and Physical    Patient: [name] DOB: May 10, 1941 DOA: 09/01/2022 DOS: the patient was seen and examined on 09/01/2022 PCP: Albina Billet, MD  Patient coming from: Home  Chief Complaint:  Chief Complaint  Patient presents with  . Weakness    HPI: [name] is a 82 y.o. 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  She did not suffer any injuries from the fall.  Patient has no history of dementia, no history of starting any new medication.  She has no recollection of the fall.  She denies complaints except for headache.  As recorded a systolic blood pressure of 220 ED course and data review: BP as high as 190/84 with otherwise normal vitals.  Labs unremarkable.  EKG, personally viewed and interpreted showing sinus at 78 with no acute ST-T wave changes.  CT head showing acute to subacute thalamic infarct as detailed below: IMPRESSION: Acute versus subacute medial left thalamic infarct. Equivocal smaller infarct in the medial right thalamus. Right parietal approach ventriculostomy catheter terminating in the frontal horn of the left lateral ventricle. No ventriculomegaly. No traumatic injury to the cervical spine.  Patient given a dose of chewable aspirin 324 mg and a dose of lisinopril for blood pressure.  Hospitalist consulted for admission.   Review of Systems: {ROS_Text:26778}  Past Medical History:  Diagnosis Date  . Acoustic neuroma ([place])   . Anorexia   . Asthma   . Diastolic dysfunction    Per echo 08/2011 with moderate LVH, normal EF at 55 to 60%, & severe hypokinesis of the inferior wall. Grade 1 diastolic dysfunction  . Hydrocephalus ([facility])    failed improvement with lumbar  drain placement - managed by [place] [facility]; revised in March 2013 with dramatic improvement  . Hyperlipidemia   . Hypertension   . ST elevation MI (STEMI) ([place]) 08/2011   Inferior wall MI with severe diffuse CAD (90% prox LAD, 70% mid LAD, 80% prox DX1, 70% mid LCX & 100% RCA) with BMS to the RCA. Not felt to be a surgical candidate due to her poor neurologic condition with no hope for recovery. Will be managed medically  . Stroke [facility])    Past Surgical History:  Procedure Laterality Date  . CORONARY ANGIOGRAPHY N/A 07/16/2018   Procedure: CORONARY ANGIOGRAPHY;  Surgeon: Isaias Cowman, MD;  Location: [place] CV LAB;  Service: Cardiovascular;  Laterality: N/A;  . CORONARY STENT PLACEMENT  08/18/2011   BMS to the RCA  . intracranial shunt  april 2013  . LEFT HEART CATH N/A 07/16/2018   Procedure: Left Heart Cath;  Surgeon: Isaias Cowman, MD;  Location: [place] CV LAB;  Service: Cardiovascular;  Laterality: N/A;  . LEFT HEART CATHETERIZATION WITH CORONARY ANGIOGRAM N/A 08/18/2011   Procedure: LEFT HEART CATHETERIZATION WITH CORONARY ANGIOGRAM;  Surgeon: Peter M Martinique, MD;  Location: [facility] CATH LAB;  Service: Cardiovascular;  Laterality: N/A;  . PERCUTANEOUS CORONARY STENT INTERVENTION (PCI-S) Right 08/18/2011   Procedure: PERCUTANEOUS CORONARY STENT INTERVENTION (PCI-S);  Surgeon: Peter M Martinique, MD;  Location: [facility] CATH LAB;  Service: Cardiovascular;  Laterality: Right;   Social History:  reports that she has never smoked. She has never used smokeless tobacco. She reports that she does not drink alcohol and  does not use drugs.  No Known Allergies  History reviewed. No pertinent family history.  Prior to Admission medications   Medication Sig Start Date End Date Taking? Authorizing Provider  albuterol (VENTOLIN HFA) 108 (90 Base) MCG/ACT inhaler Inhale 2 puffs into the lungs every 6 (six) hours as needed for wheezing or shortness of breath. 01/12/22   Blake Divine, MD   atorvastatin (LIPITOR) 80 MG tablet Take 80 mg by mouth as needed. 12/21/21   [provider]  benzonatate (TESSALON) 100 MG capsule Take 100 mg by mouth 3 (three) times daily. 12/25/21   [provider]  clopidogrel (PLAVIX) 75 MG tablet Take 1 tablet (75 mg total) by mouth daily. Patient not taking: Reported on 01/11/2022 06/09/12   Martinique, Peter M, MD  isosorbide mononitrate (IMDUR) 60 MG 24 hr tablet Take 1 tablet (60 mg total) by mouth daily. Patient not taking: Reported on 01/11/2022 07/18/18   Hillary Bow, MD  lisinopril (PRINIVIL,ZESTRIL) 2.5 MG tablet Take 1 tablet (2.5 mg total) by mouth daily. Patient not taking: Reported on 01/11/2022 07/17/18   Hillary Bow, MD  metoprolol tartrate (LOPRESSOR) 25 MG tablet Take 1 tablet by mouth 2 (two) times daily. 08/05/17   [provider]  nitroGLYCERIN (NITROSTAT) 0.4 MG SL tablet Place 1 tablet (0.4 mg total) under the tongue every 5 (five) minutes as needed for chest pain. Patient not taking: Reported on 01/11/2022 07/17/18   Hillary Bow, MD  traMADol (ULTRAM) 50 MG tablet Take 1 tablet (50 mg total) by mouth every 6 (six) hours as needed for moderate pain or severe pain. Patient not taking: Reported on 01/11/2022 07/17/18   Hillary Bow, MD    Physical Exam: Vitals:   09/01/22 2126 09/01/22 2127 09/01/22 2200 09/01/22 2209  BP:    (!) 190/84  Pulse: 80 77 72 73  Resp: (!) 36 17 (!) 21 16  Temp:      TempSrc:      SpO2: 97% 95% 98% 99%  Weight:       Physical Exam  Labs on Admission: I have personally reviewed following labs and imaging studies  CBC: Recent Labs  Lab 09/01/22 1957  WBC 7.9  HGB 14.3  HCT 43.8  MCV 94.2  PLT [id number]   Basic Metabolic Panel: Recent Labs  Lab 09/01/22 1957  NA 139  K 4.0  CL 102  CO2 28  GLUCOSE 120*  BUN 25*  CREATININE 0.97  CALCIUM 9.0   GFR: CrCl cannot be calculated (Unknown ideal weight.). Liver Function Tests: No results for input(s): "AST", "ALT",  "ALKPHOS", "BILITOT", "PROT", "ALBUMIN" in the last 168 hours. No results for input(s): "LIPASE", "AMYLASE" in the last 168 hours. No results for input(s): "AMMONIA" in the last 168 hours. Coagulation Profile: No results for input(s): "INR", "PROTIME" in the last 168 hours. Cardiac Enzymes: No results for input(s): "CKTOTAL", "CKMB", "CKMBINDEX", "TROPONINI" in the last 168 hours. BNP (last 3 results) No results for input(s): "PROBNP" in the last 8760 hours. HbA1C: No results for input(s): "HGBA1C" in the last 72 hours. CBG: No results for input(s): "GLUCAP" in the last 168 hours. Lipid Profile: No results for input(s): "CHOL", "HDL", "LDLCALC", "TRIG", "CHOLHDL", "LDLDIRECT" in the last 72 hours. Thyroid Function Tests: No results for input(s): "TSH", "T4TOTAL", "FREET4", "T3FREE", "THYROIDAB" in the last 72 hours. Anemia Panel: No results for input(s): "VITAMINB12", "FOLATE", "FERRITIN", "TIBC", "IRON", "RETICCTPCT" in the last 72 hours. Urine analysis:    Component Value Date/Time   COLORURINE YELLOW (  A) 09/01/2022 2059   APPEARANCEUR CLEAR (A) 09/01/2022 2059   APPEARANCEUR Clear 09/22/2012 2145   LABSPEC 1.013 09/01/2022 2059   LABSPEC 1.015 09/22/2012 2145   PHURINE 7.0 09/01/2022 2059   GLUCOSEU NEGATIVE 09/01/2022 2059   GLUCOSEU Negative 09/22/2012 2145   HGBUR SMALL (A) 09/01/2022 2059   BILIRUBINUR NEGATIVE 09/01/2022 2059   BILIRUBINUR Negative 09/22/2012 2145   [place] NEGATIVE 09/01/2022 2059   PROTEINUR 100 (A) 09/01/2022 2059   NITRITE NEGATIVE 09/01/2022 2059   LEUKOCYTESUR NEGATIVE 09/01/2022 2059   LEUKOCYTESUR Trace 09/22/2012 2145    Radiological Exams on Admission: CT Head Wo Contrast  Result Date: 09/01/2022 CLINICAL DATA:  Confusion, speech difficulty x3 days, prior brain tumor EXAM: CT HEAD WITHOUT CONTRAST CT CERVICAL SPINE WITHOUT CONTRAST TECHNIQUE: Multidetector CT imaging of the head and cervical spine was performed following the standard  protocol without intravenous contrast. Multiplanar CT image reconstructions of the cervical spine were also generated. RADIATION DOSE REDUCTION: This exam was performed according to the departmental dose-optimization program which includes automated exposure control, adjustment of the mA and/or kV according to patient size and/or use of iterative reconstruction technique. COMPARISON:  10/10/2012 FINDINGS: CT HEAD FINDINGS Brain: No evidence of hemorrhage,extra-axial collection or mass lesion/mass effect. Hypodensity in the medial left thalamus (series 2/image 14), worrisome for acute/subacute infarct. Additional possible smaller abnormality in the medial right thalamus. Old left basal ganglia lacunar infarct. Right parietal approach ventriculostomy catheter terminating in the frontal horn of the left lateral ventricle. No ventriculomegaly. Subcortical white matter and periventricular small vessel ischemic changes. Vascular: Intracranial atherosclerosis. Skull: Normal. Negative for fracture or focal lesion. Old left suboccipital craniotomy. Sinuses/Orbits: The visualized paranasal sinuses are essentially clear. The mastoid air cells are unopacified. Other: None. CT CERVICAL SPINE FINDINGS Alignment: Normal cervical lordosis. Skull base and vertebrae: No acute fracture. No primary bone lesion or focal pathologic process. Soft tissues and spinal canal: No prevertebral fluid or swelling. No visible canal hematoma. Disc levels: Intervertebral disc spaces are maintained. Spinal canal is patent. Upper chest: Visualized lung apices are clear. Other: None. IMPRESSION: Acute versus subacute medial left thalamic infarct. Equivocal smaller infarct in the medial right thalamus. Right parietal approach ventriculostomy catheter terminating in the frontal horn of the left lateral ventricle. No ventriculomegaly. No traumatic injury to the cervical spine. Electronically Signed   By: Julian Hy M.D.   On: 09/01/2022 21:53    CT Cervical Spine Wo Contrast  Result Date: 09/01/2022 CLINICAL DATA:  Confusion, speech difficulty x3 days, prior brain tumor EXAM: CT HEAD WITHOUT CONTRAST CT CERVICAL SPINE WITHOUT CONTRAST TECHNIQUE: Multidetector CT imaging of the head and cervical spine was performed following the standard protocol without intravenous contrast. Multiplanar CT image reconstructions of the cervical spine were also generated. RADIATION DOSE REDUCTION: This exam was performed according to the departmental dose-optimization program which includes automated exposure control, adjustment of the mA and/or kV according to patient size and/or use of iterative reconstruction technique. COMPARISON:  10/10/2012 FINDINGS: CT HEAD FINDINGS Brain: No evidence of hemorrhage,extra-axial collection or mass lesion/mass effect. Hypodensity in the medial left thalamus (series 2/image 14), worrisome for acute/subacute infarct. Additional possible smaller abnormality in the medial right thalamus. Old left basal ganglia lacunar infarct. Right parietal approach ventriculostomy catheter terminating in the frontal horn of the left lateral ventricle. No ventriculomegaly. Subcortical white matter and periventricular small vessel ischemic changes. Vascular: Intracranial atherosclerosis. Skull: Normal. Negative for fracture or focal lesion. Old left suboccipital craniotomy. Sinuses/Orbits: The visualized paranasal sinuses are essentially  clear. The mastoid air cells are unopacified. Other: None. CT CERVICAL SPINE FINDINGS Alignment: Normal cervical lordosis. Skull base and vertebrae: No acute fracture. No primary bone lesion or focal pathologic process. Soft tissues and spinal canal: No prevertebral fluid or swelling. No visible canal hematoma. Disc levels: Intervertebral disc spaces are maintained. Spinal canal is patent. Upper chest: Visualized lung apices are clear. Other: None. IMPRESSION: Acute versus subacute medial left thalamic infarct. Equivocal  smaller infarct in the medial right thalamus. Right parietal approach ventriculostomy catheter terminating in the frontal horn of the left lateral ventricle. No ventriculomegaly. No traumatic injury to the cervical spine. Electronically Signed   By: Julian Hy M.D.   On: 09/01/2022 21:53     Data Reviewed: Relevant notes from primary care and specialist visits, past discharge summaries as available in EHR, including Care Everywhere. Prior diagnostic testing as pertinent to current admission diagnoses Updated medications and problem lists for reconciliation ED course, including vitals, labs, imaging, treatment and response to treatment Triage notes, nursing and pharmacy notes and ED provider's notes Notable results as noted in HPI   Assessment and Plan: * Acute thalamic infarction [facility]) CT shows :acute versus subacute medial left thalamic infarct. Equivocal smaller infarct in the medial right thalamus. Stroke likely happened 3 days prior given onset of symptoms followed in fall 3 days prior Blood pressure control  Continue home atorvastatin 80 and check LDL ASA '81mg'$  daily, Plavix '75mg'$  daily x 3 weeks then monotherapy thereafter (patient got full dose aspirin in the ED) Telemetry, echocardiogram, carotid Doppler Physical therapy/Occupational therapy/Speech therapy if failed dysphagia screen Neurology consult to follow  VP (ventriculoperitoneal) shunt status for hydrocephalus Ventriculostomy catheter in correct placement per CT.  No ventriculomegaly  S/P excision of acoustic neuroma 2014 No acute issues suspected based on head CT  Chronic diastolic CHF (congestive heart failure) (HCC) Clinically euvolemic Continue lisinopril and metoprolol  Asthma Not acutely exacerbated Albuterol as needed  Hypertensive emergency SBP 220 with EMS and 190 on arrival Given patient is 3 days out from stroke will get BP control to systolic under [id number] Patient reportedly was not taking  medication Continue lisinopril, metoprolol  CAD S/P percutaneous coronary angioplasty History of STEMI 2013 s/p bare-metal stent to RCA No complaints of chest pain, EKG nonacute Continue atorvastatin, clopidogrel, metoprolol and nitroglycerin as needed chest pain        DVT prophylaxis: Lovenox  Consults:   Advance Care Planning:   Code Status: Prior ***  Family Communication: none***  Disposition Plan: Back to previous home environment  Severity of Illness: {Observation/Inpatient:21159}  Author: Athena Masse, MD 09/01/2022 10:34 PM  For on call review [email or web address].

## 2022-09-01 NOTE — Assessment & Plan Note (Addendum)
No acute issues suspected based on head CT

## 2022-09-01 NOTE — Assessment & Plan Note (Addendum)
CT shows :acute versus subacute medial left thalamic infarct. Equivocal smaller infarct in the medial right thalamus. Stroke likely happened 3 days prior given onset of symptoms followed in fall 3 days prior Blood pressure control  Continue home atorvastatin 80 and check LDL ASA '81mg'$  daily, Plavix '75mg'$  daily x 3 weeks then monotherapy thereafter (patient got full dose aspirin in the ED) Telemetry, echocardiogram, carotid Doppler Physical therapy/Occupational therapy/Speech therapy if failed dysphagia screen Neurology consult to follow

## 2022-09-01 NOTE — ED Provider Notes (Addendum)
[facility] Provider Note    Event Date/Time   First MD Initiated Contact with Patient 09/01/22 2010     (approximate)   History   Weakness  Video interpreter [place] utilized to speak with patient.  Also communicated with son, though the patient's son communicates in [place]  HPI  [name] is a 82 y.o. female on review of old records has hypertension, hyperlipidemia, CAD, diastolic CHF, and asthma and a brain tumor with resection about 7 or so years ago at [facility]  Patient reports no pain or discomfort.  She does not recall any falls or injuries though.  Patient's son reports that about 3 days ago his mother had a fall and since then has been acting a little differently, she has had some trouble walking with balance and has fallen twice without any obvious injuries.  She has chronic difficulty with her right hand for years which she reports was related to tumor.  She started having trouble speaking nonsensically at times and saying words that just did not make sense during her sentences for the last about 3 days  The patient reports no concerns but does also not recall the fall.  Son reports that she does not have any history of confusion until about 3 days ago.  She does not have a history of dementia.  Son reports she is concerned because some of her symptoms of the words that she is saying and acting seem like the symptoms she had when her brain tumor was first discovered  She has not taken her evening blood pressure medicine she typically takes     Physical Exam   Triage Vital Signs: ED Triage Vitals [09/01/22 1951]  Enc Vitals Group     BP (!) 179/67     Pulse Rate 71     Resp 18     Temp (!) 97.4 F (36.3 C)     Temp Source Oral     SpO2 99 %     Weight 154 lb 5.2 oz (70 kg)     Height      Head Circumference      Peak Flow      Pain Score 0     Pain Loc      Pain Edu?      Excl. in [place]?     Most recent vital signs: Vitals:    09/01/22 2200 09/01/22 2209  BP:  (!) 190/84  Pulse: 72 73  Resp: (!) 21 16  Temp:    SpO2: 98% 99%     General: Awake, no distress.  Follows commands.  Patient's son reports that at times she is saying words that do not necessarily make sense for how they should CV:  Good peripheral perfusion.  Normal rate and tones Resp:  Normal effort.  Clear lungs bilaterally Abd:  No distention.  Soft nontender nondistended Other:  All extremities well though seems she may have some slight weakness in her left arm.  She does have some very mild weakness also in her right arm but son reports that is quite chronic and related to her previous brain tumor   ED Results / Procedures / Treatments   Labs (all labs ordered are listed, but only abnormal results are displayed) Labs Reviewed  BASIC METABOLIC PANEL - Abnormal; Notable for the following components:      Result Value   Glucose, Bld 120 (*)    BUN 25 (*)    GFR,  Estimated 59 (*)    All other components within normal limits  URINALYSIS, ROUTINE W REFLEX MICROSCOPIC - Abnormal; Notable for the following components:   Color, Urine YELLOW (*)    APPearance CLEAR (*)    Hgb urine dipstick SMALL (*)    Protein, ur 100 (*)    All other components within normal limits  CBC  LIPID PANEL  HEMOGLOBIN A1C     EKG  Interpreted me at 1950 heart rate 70 QRS 100 QTc 440 Sinus rhythm, no evidence of acute ischemia   RADIOLOGY  CT imaging of the head interpreted by me as negative for obvious acute intracranial hemorrhage.   Acute versus subacute medial left thalamic infarct. Equivocal smaller infarct in the medial right thalamus.   Right parietal approach ventriculostomy catheter terminating in the frontal horn of the left lateral ventricle. No ventriculomegaly.   No traumatic injury to the cervical spine.   PROCEDURES:  Critical Care performed: No  Procedures   MEDICATIONS ORDERED IN ED: Medications  atorvastatin (LIPITOR)  tablet 80 mg (has no administration in time range)  lisinopril (ZESTRIL) tablet 2.5 mg (has no administration in time range)  metoprolol tartrate (LOPRESSOR) tablet 25 mg (has no administration in time range)  nitroGLYCERIN (NITROSTAT) SL tablet 0.4 mg (has no administration in time range)  clopidogrel (PLAVIX) tablet 75 mg (has no administration in time range)  albuterol (VENTOLIN HFA) 108 (90 Base) MCG/ACT inhaler 2 puff (has no administration in time range)  aspirin EC tablet 81 mg (has no administration in time range)   stroke: early stages of recovery book (has no administration in time range)  0.9 %  sodium chloride infusion (has no administration in time range)  acetaminophen (TYLENOL) tablet 650 mg (has no administration in time range)    Or  acetaminophen (TYLENOL) 160 MG/5ML solution 650 mg (has no administration in time range)    Or  acetaminophen (TYLENOL) suppository 650 mg (has no administration in time range)  enoxaparin (LOVENOX) injection 40 mg (has no administration in time range)  lisinopril (ZESTRIL) tablet 2.5 mg (2.5 mg Oral Given 09/01/22 2048)  aspirin chewable tablet 324 mg (324 mg Oral Given 09/01/22 2210)     IMPRESSION / MDM / ASSESSMENT AND PLAN / ED COURSE  I reviewed the triage vital signs and the nursing notes.                              Differential diagnosis includes, but is not limited to, stroke, intracranial hemorrhage, recurrent tumor, metabolic derangement infection, recrudescence type syndrome etc.  No acute chest pain or shortness of breath.  EKG reassuring.  After workup, patient has what appears to be acute to subacute thalamic infarcts.  Suspect patient has had a small stroke.  Outside of thrombolytic or LVO treatment window is with symptoms starting approximately 2 to 3 days ago.  No obvious traumatic injuries from her fall   Patient's presentation is most consistent with acute complicated illness / injury requiring diagnostic workup.   The  patient is on the cardiac monitor to evaluate for evidence of arrhythmia and/or significant heart rate changes.  Consulted with the hospitalist, patient will be admitted service Dr. Damita Dunnings.  Updated patient family at bedside of recommendation for admission and they are understanding   Labs reviewed including unremarkable CBC metabolic panel  FINAL CLINICAL IMPRESSION(S) / ED DIAGNOSES   Final diagnoses:  Cerebrovascular accident (CVA), unspecified mechanism ([place])  Rx / DC Orders   ED Discharge Orders     None        Note:  This document was prepared using Dragon voice recognition software and may include unintentional dictation errors.   Delman Kitten, MD 09/01/22 [id number]    Delman Kitten, MD 09/01/22 [phone]

## 2022-09-01 NOTE — Assessment & Plan Note (Signed)
Not acutely exacerbated.  Albuterol as needed 

## 2022-09-01 NOTE — Assessment & Plan Note (Signed)
Clinically euvolemic Continue lisinopril and metoprolol

## 2022-09-01 NOTE — ED Triage Notes (Addendum)
BIB EMS for frequent falls x3 days, HA, and elevated BP (SBP220). Pt is denying any pain during triage exam. Per family new confusion, did not hit head in fall, not on a blood thinner.   H/o HTN  BP 179/67 in triage.  No meds from EMS

## 2022-09-01 NOTE — Assessment & Plan Note (Signed)
Ventriculostomy catheter in correct placement per CT.  No ventriculomegaly

## 2022-09-01 NOTE — Assessment & Plan Note (Signed)
History of STEMI 2013 s/p bare-metal stent to RCA No complaints of chest pain, EKG nonacute Continue atorvastatin, clopidogrel, metoprolol and nitroglycerin as needed chest pain

## 2022-09-02 ENCOUNTER — Inpatient Hospital Stay
Admit: 2022-09-02 | Discharge: 2022-09-02 | Disposition: A | Discharge status: Home / Self Care | Payer: Medicare Other | Attending: Internal Medicine | Admitting: Internal Medicine

## 2022-09-02 ENCOUNTER — Inpatient Hospital Stay: Payer: Medicare Other

## 2022-09-02 DIAGNOSIS — I6389 Other cerebral infarction: Secondary | ICD-10-CM | POA: Diagnosis not present

## 2022-09-02 DIAGNOSIS — I6381 Other cerebral infarction due to occlusion or stenosis of small artery: Secondary | ICD-10-CM | POA: Diagnosis not present

## 2022-09-02 LAB — GLUCOSE, CAPILLARY: Glucose-Capillary: 108 mg/dL — ABNORMAL HIGH (ref 70–99)

## 2022-09-02 LAB — LIPID PANEL
Cholesterol: 171 mg/dL (ref 0–200)
HDL: 53 mg/dL (ref >40)
LDL Cholesterol: 105 mg/dL — ABNORMAL HIGH (ref 0–99)
Total CHOL/HDL Ratio: 3.2 RATIO
Triglycerides: 64 mg/dL (ref <150)
VLDL: 13 mg/dL (ref 0–40)

## 2022-09-02 LAB — ECHOCARDIOGRAM COMPLETE
AR max vel: 2.05 cm2
AV Peak grad: 4.6 mmHg
Ao pk vel: 1.07 m/s
Area-P 1/2: 2.21 cm2
S' Lateral: 2.6 cm
Weight: 2469.15 oz

## 2022-09-02 MED ORDER — SODIUM CHLORIDE 0.9 % IV SOLN: INTRAVENOUS | Status: DC

## 2022-09-02 MED ORDER — INFLUENZA VAC A&B SA ADJ QUAD 0.5 ML IM PRSY: 0.5000 mL | PREFILLED_SYRINGE | INTRAMUSCULAR | Status: DC | PRN

## 2022-09-02 NOTE — Progress Notes (Signed)
Progress Note   Patient: [name] [id number] DOB: 1940-09-10 DOA: 09/01/2022     1 DOS: the patient was seen and examined on 09/02/2022    Subjective: Patient seen and examined this morning in the presence of speech therapist, patient's daughter-in-law as well as patient's son According to the family patient weakness is much better and mental status is much better compared to upon presentation. Communication was offered through interpretation services Patient was able to speak however in low tone Denies chest pain nausea vomiting abdominal pain or worsening weakness  Brief hospital course: [name] is a 82 y.o. 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  She did not suffer any injuries from the fall.  Patient has no history of dementia, no history of starting any new medication. EMS recorded a systolic blood pressure of 220.  History is given by daughter and granddaughter at the bedside who also served as interpreters.  Daughter states that when she asked her mother question the responses totally unrelated to the question asked and does not make sense. ED course and data review: BP as high as 190/84 with otherwise normal vitals.  Labs unremarkable.  EKG, personally viewed and interpreted showing sinus at 78 with no acute ST-T wave changes.  CT head showing acute to subacute thalamic infarct as detailed below:  Assessment and Plan: Acute thalamic infarction [facility]) CT shows :acute versus subacute medial left thalamic infarct. Equivocal smaller infarct in the medial right thalamus. Stroke likely happened 3 days prior given onset of symptoms followed in fall 3 days prior Blood pressure control  Continue home  atorvastatin 80 and check LDL Continue ASA '81mg'$  daily, Plavix '75mg'$  daily x 3 weeks then monotherapy thereafter (patient got full dose aspirin in the ED) Continue telemetry,  Follow-up on echocardiogram, carotid Doppler Patient has been cleared by speech therapist to have a diet PT OT on board we appreciate input Neurologist on board    VP (ventriculoperitoneal) shunt status for hydrocephalus Ventriculostomy catheter in correct placement per CT.  No ventriculomegaly   S/P excision of acoustic neuroma 2014 No acute issues suspected based on head CT   Chronic diastolic CHF (congestive heart failure) (HCC) Clinically euvolemic Continue lisinopril and metoprolol   Asthma Not acutely exacerbated Albuterol as needed   Hypertensive emergency We will allow permissive hypertension and only treat when BP is 220/120  For the first 24 hours according to the neurologist recommendation   CAD S/P percutaneous coronary angioplasty History of STEMI 2013 s/p bare-metal stent to RCA No complaints of chest pain, EKG nonacute Continue atorvastatin, clopidogrel, metoprolol and nitroglycerin as needed chest pain   DVT prophylaxis: Lovenox   Consults: none (neuro in the am)   Advance Care Planning:   Code Status: Prior    Family Communication: Daughter and granddaughter at bedside   Disposition Plan: Back to previous home environment    Physical Exam Vitals and nursing note reviewed.  Constitutional:      General: She is not in acute distress. HENT:     Head: Normocephalic and atraumatic.  Cardiovascular:     Rate and Rhythm: Normal rate and regular rhythm.     Heart sounds: Normal heart sounds.  Pulmonary:     Effort: Pulmonary effort is normal.  Breath sounds: Normal breath sounds.  Abdominal:     Palpations: Abdomen is soft.     Tenderness: There is no abdominal tenderness.  Able to move lower extremities but with some difficulty Able to move bilateral upper extremities  however slightly weaker on the right   Physical Exam: Vitals:   09/02/22 0202 09/02/22 0551 09/02/22 1305 09/02/22 1305  BP: (!) 102/53 (!) 141/61 (!) 128/58 (!) 128/58  Pulse: 63 (!) 52 61 (!) 32  Resp:  '18 16 16  '$ Temp:  98.6 F (37 C) 97.9 F (36.6 C) 97.9 F (36.6 C)  TempSrc:  Oral    SpO2: 95% 95% 96% 96%  Weight:        Data Reviewed: I personally reviewed patient's lab results CBC, BMP as well as CT scan of the brain results  Family Communication: Plan of care discussed with patient's daughter-in-law as well as patient's son present at bedside  Disposition: Status is: Inpatient Patient still remains inpatient on account of having acute stroke undergoing cerebral stroke workup  Planned Discharge Destination: Pending clinical course     Time spent: 60 minutes utilizing translation services discussing plan of care with patient's family as well as consultants.  Author: Verline Lema, MD 09/02/2022 3:55 PM  For on call review [email or web address].

## 2022-09-02 NOTE — Progress Notes (Signed)
PT Cancellation Note  Patient Details Name: [name] MRN: [id number] DOB: [birth year]   Cancelled Treatment:     PT evaluation and treatment not completed 2/2 pt out of the room for Ultrasound testing. PT will attempt later if time permits.    Barak Bialecki H Lorrayne Ismael 09/02/2022, 11:01 AM

## 2022-09-02 NOTE — Progress Notes (Signed)
  Echocardiogram 2D Echocardiogram has been performed.  Claretta Fraise 09/02/2022, 9:12 AM

## 2022-09-02 NOTE — Progress Notes (Addendum)
  CODE STROKE [place] @ 0216 EDP at bedside @ 0216 LKWT @ 4 days ago per bedside rn Bedside Inpatient Provider would like a neurologist to evaluate despite lkw MRS: 0 To CT @ 0216 Stayed in CT room for evaluation Paged [facility] @ [facility] on screen @ 0225

## 2022-09-02 NOTE — Consult Note (Signed)
[facility] TeleNeurology Consult Services   Patient Name:   [name], [name] Date of Birth:   Jan 02, 1941 Identification Number:   MRN - [id number] Date of Service:   09/02/2022 02:22:37  Diagnosis:       I63.89 - Cerebrovascular accident (CVA) due to other mechanism [facility])  Impression:      82 yo female with history of strokes, HTN, HLD, CAD, hydrocephalus status post VP shunt, craniectomy 2014 for resection acoustic neuroma admitted for stroke work-up of acute/subacute Left thalamic infarct presenting now with concerning for possible worsening symptoms. She was drowsy and uncooperative on the floor, but is now waking up more and following commands with interpreter. NIHSS 9. CT Head stable. No changes to plan, suspect possibly worsening symptoms in the setting of drowsiness. Continue ASA/Plavix, permissive HTN and stroke work-up.  Our recommendations are outlined below.  Recommendations:        Stroke/Telemetry Floor       Neuro Checks       Bedside Swallow Eval       DVT Prophylaxis       IV Fluids, Normal Saline       Head of Bed 30 Degrees       Euglycemia and Avoid Hyperthermia (PRN Acetaminophen)       Initiate dual antiplatelet therapy with Aspirin 81 mg daily and Clopidogrel 75 mg daily.       Antihypertensives PRN if Blood pressure is greater than 220/120 or there is a concern for End organ damage/contraindications for permissive HTN. If blood pressure is greater than 220/120 give labetalol PO or IV or Vasotec IV with a goal of 15% reduction in BP during the first 24 hours.  Sign Out:       Discussed with Primary Attending    ------------------------------------------------------------------------------  Advanced Imaging: Advanced Imaging Deferred because:  Does not meet criteria due to being out of the 24-hour window for thrombectomy   Metrics: Last Known Well: 08/29/2022 12:00:00 [facility] Notification Time: 09/02/2022 02:22:37 Stamp Time:  09/02/2022 02:22:37 Initial Response Time: 09/02/2022 02:25:30 Symptoms: worsening symptoms. Initial patient interaction: 09/02/2022 02:29:33 NIHSS Assessment Completed: 09/02/2022 02:41:26 Patient is not a candidate for Thrombolytic. Thrombolytic Medical Decision: 09/02/2022 02:41:27 Patient was not deemed candidate for Thrombolytic because of following reasons: Last Well Known Above 4.5 Hours. Stroke in last 3 months.  I personally Reviewed the CT Head and it Showed stable acute/subacute Left thalamic infarct.  Primary Provider Notified of Diagnostic Impression and Management Plan on: 09/02/2022 02:47:35 Spoke With: Sharion Settler, NP Able to Reach 09/02/2022 02:47:35    ------------------------------------------------------------------------------  History of Present Illness: Patient is a 82 year old Female.  Inpatient stroke alert was called for symptoms of worsening symptoms. Patient admitted earlier today for stroke work-up with 3 days of speech changes and gait instability found to have concern for acute/subacute Left thalamic infarct. When she was admitted to the floor, she was speaking to family and following commands with family. She had an initial NIHSS 4. Repeat NIHSS completed at 0100 and deficits seemed to be worse, no longer following commands or speaking. Had generalized weakness.   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  Past Surgical  History: There Is No Surgical History Contributory To Today's Visit    Examination: BP(136/56), Pulse(63), 1A: Level of Consciousness - Arouses to minor stimulation + 1 1B: Ask Month and Age - Could Not Answer Either Question Correctly + 2 1C: Blink Eyes & Squeeze Hands - Performs Both Tasks + 0 2: Test Horizontal Extraocular Movements - Normal + 0 3: Test Visual Fields - No Visual Loss + 0 4: Test Facial Palsy (Use Grimace if Obtunded) - Normal symmetry + 0 5A: Test Left Arm Motor Drift - No Drift for 10 Seconds + 0 5B: Test Right Arm Motor Drift - No Drift for 10 Seconds + 0 6A: Test Left Leg Motor Drift - Some Effort Against Gravity + 2 6B: Test Right Leg Motor Drift - Some Effort Against Gravity + 2 7: Test Limb Ataxia (FNF/Heel-Shin) - No Ataxia + 0 8: Test Sensation - Mild-Moderate Loss: Less Sharp/More Dull + 1 9: Test Language/Aphasia - Mild-Moderate Aphasia: Some Obvious Changes, Without Significant Limitation + 1 10: Test Dysarthria - Normal + 0 11: Test Extinction/Inattention - No abnormality + 0  NIHSS Score: 9  NIHSS Free Text : Decreased LLE  Pre-Morbid Modified Rankin Scale: 0 Points = No symptoms at all  Spoke with : Sharion Settler, NP  Patient/Family was informed the Neurology Consult would occur via TeleHealth consult by way of interactive audio and video telecommunications and consented to receiving care in this manner.   Patient is being evaluated for possible acute neurologic impairment and high probability of imminent or life-threatening deterioration. I spent total of 30 minutes providing care to this patient, including time for face to face visit via telemedicine, review of medical records, imaging studies and discussion of findings with providers, the patient and/or family.   Dr Janeece Agee   [facility] For Inpatient follow-up with [facility] physician please call [facility] [phone]. This is not an outpatient service. Post hospital  discharge, please contact hospital directly.  Please do not communicate with [facility] physicians via secure chat. If you have any questions, Please contact [facility]. Please call or reconsult our service if there are any clinical or diagnostic changes.

## 2022-09-02 NOTE — Evaluation (Addendum)
Speech Language Pathology Evaluation Patient Details Name: [name] MRN: [id number] DOB: [birth year] Today's Date: 09/02/2022 Time: 0930-1040 SLP Time Calculation (min) (ACUTE ONLY): 70 min  Problem List:  Patient Active Problem List   Diagnosis Date Noted   Hypertensive emergency 09/01/2022   Asthma 09/01/2022   Chronic diastolic CHF (congestive heart failure) ([place]) 09/01/2022   S/P excision of acoustic neuroma 2014 09/01/2022   VP (ventriculoperitoneal) shunt status for hydrocephalus 09/01/2022   Acute thalamic infarction ([place]) 09/01/2022   NSTEMI (non-ST elevated myocardial infarction) ([place]) 07/15/2018   ST elevation myocardial infarction (STEMI) of inferior wall ([place]) 08/21/2011   CAD S/P percutaneous coronary angioplasty 08/21/2011   Hydrocephalus ([place]) 08/21/2011   Acoustic neuroma ([place]) 08/21/2011   HTN (hypertension) 08/20/2011   Hypercholesterolemia 08/20/2011   Past Medical History:  Past Medical History:  Diagnosis Date   Acoustic neuroma ([place])    Anorexia    Asthma    Diastolic dysfunction    Per echo 08/2011 with moderate LVH, normal EF at 55 to 60%, & severe hypokinesis of the inferior wall. Grade 1 diastolic dysfunction   Hydrocephalus (HCC)    failed improvement with lumbar drain placement - managed by [place] neurology; revised in March 2013 with dramatic improvement   Hyperlipidemia    Hypertension    ST elevation MI (STEMI) ([place]) 08/2011   Inferior wall MI with severe diffuse CAD (90% prox LAD, 70% mid LAD, 80% prox DX1, 70% mid LCX & 100% RCA) with BMS to the RCA. Not felt to be a surgical candidate due to her poor neurologic condition with no hope for recovery. Will be managed medically   Stroke [facility])    Past Surgical History:  Past Surgical History:  Procedure Laterality Date   CORONARY ANGIOGRAPHY N/A 07/16/2018   Procedure: CORONARY ANGIOGRAPHY;  Surgeon: Isaias Cowman, MD;  Location: [place] [facility];  Service: Cardiovascular;  Laterality:  N/A;   CORONARY STENT PLACEMENT  08/18/2011   BMS to the RCA   intracranial shunt  april 2013   LEFT HEART CATH N/A 07/16/2018   Procedure: Left Heart Cath;  Surgeon: Isaias Cowman, MD;  Location: [place] [facility];  Service: Cardiovascular;  Laterality: N/A;   LEFT HEART CATHETERIZATION WITH CORONARY ANGIOGRAM N/A 08/18/2011   Procedure: LEFT HEART CATHETERIZATION WITH CORONARY ANGIOGRAM;  Surgeon: Peter M Martinique, MD;  Location[facility] C[facility] LAB;  Service: Cardiovascular;  Laterality: N/A;   PERCUTANEOUS CORONARY STENT INTERVENTION (PCI-S) Right 08/18/2011   Procedure: PERCUTANEOUS CORONARY STENT INTERVENTION (PCI-S);  Surgeon: Peter M Martinique, MD;  Location: [facility] CATH LAB;  Service: Cardiovascular;  Laterality: Right;   HPI:  Pt is a 82 yo female with history of strokes, HTN, HLD, CAD, hydrocephalus status post VP shunt, craniectomy 2014 for resection acoustic neuroma admitted for stroke work-up of acute/subacute Left thalamic infarct.  Per Neurology's initial note, pt was drowsy and uncooperative on the floor, but then waking up more and following commands with interpreter.  There was suspecion of possibly worsening symptoms in the setting of drowsiness post admit.  Pt presented to the ED with confusion, speaking nonsensically, unsteady gait starting ~3 days ago following a fall.  She did not suffer any injuries from the fall.  Head CT: "IMPRESSION:  1. Stable head CT, with suspected acute to subacute left thalamic  infarct, an additional possible smaller right thalamic infarct.  2. No other new acute intracranial abnormality. No hemorrhage or  significant mass effect.  3. Sequelae of prior left retrosigmoid craniectomy  with cranioplasty  with chronic encephalomalacia within the underlying left cerebral  hemisphere.  4. Right occipital approach VP shunt catheter with tip terminating  in the left lateral ventricle. Stable ventricular size and  morphology without hydrocephalus.  5. Underlying atrophy with  chronic small vessel ischemic disease,  with multiple remote ischemic infarcts as above.".   Assessment / Plan / Recommendation Clinical Impression   Pt seen for Informal cognitive-linguistic assessment and BSE at bedside today. Per Family, NSG and MD(note) reports, pt's presentation is improving today since her admit. Pt awake, alert to self and family and that she is in the hospital. Family present in room. Pt is Production designer, theatre/television/film incorporated into the session - pt's Dtr in law assisted in interpreting d/t pt's low volume during speech and Interpreter Service being unable to fully hear the pt's responses. Services Member: Malen Gauze; [id number]   Pt on RA; afebrile. WBC wnl.    BSE:  Pt appears to present w/ functional oropharyngeal phase swallowing w/ No oropharyngeal phase dysphagia noted, No neuromuscular deficits noted. Pt consumed po trials during breakfast meal w/ No overt, clinical s/s of aspiration during po trials. Belching present.  Pt appears at reduced risk for aspiration following general aspiration precautions. However, pt does have challenging factors that could impact oropharyngeal swallowing to include recent, new CVA and hospitalization. These factors can increase risk for aspiration, dysphagia as well as decreased oral intake overall.  During po trials, pt consumed all consistencies w/ no overt coughing, decline in vocal quality, or change in respiratory presentation during/post trials. O2 sats remained 98%. Oral phase appeared [facility] w/ timely bolus management, mastication, and control of bolus propulsion for A-P transfer for swallowing. Dentures+. Oral clearing achieved w/ all trial consistencies -- moistened, soft foods given.  OM Exam appeared [facility] w/ no unilateral weakness noted. Speech Clear, intelligible.  Pt fed self w/ min setup support provided.    Recommend continue a Regular consistency diet w/ Cut meats, moistened foods; Thin liquids. Encouraged pt's  Family to bring in foods of Preference for pt to encourage oral intake. Recommend general aspiration precautions, Pills WHOLE in Puree for safer, easier swallowing IF needed. Education provided on Pills in Puree; food consistencies and easy to eat options/Preferences; general aspiration precautions to pt/Family and NSG. MD/NSG to reconsult if any new needs arise during this admit. NSG updated, agreed. MD updated. Recommend Dietician f/u for support if needed.     Informal assessment of Motor Speech/Cognitive-linguistic abilities:  Assessment of pt's strengths/weakness was challenged by [employer] and current Confusion and decreased Attention during tasks. Focused Attention to tasks was intermittent; she was distracted by her Environment and using the [employer] via a computer screen appeared challenging for pt. Pt responded best to direct communication w/ her Dtr in law.  Pt was able to answer few Y/N questions and a few direct questions re: self(Orientation xperson, place, family); followed 3-4 basic 1 step commands w/ gentle cues(raising arm, leg, take/hold cup). Awareness of environment appeared decreased as well as anticipatory problem-solving. Volume of speech was Low; Paucity of speech noted. She was hesitant in her verbal responses and looked to various people in the room often during engagement -- seemed to look toward Family members to help answer questions/tasks put to her. But when her tray was setup for self-feeding, she Immediately utilized utensils and fed self w/out support needed. Motor Speech abilities appeared [facility]. OM exam was [facility] w/ No unilateral labial/lingual weakness or deviation in ROM.  In the setting of the Language Barrier and pt being out of her environment w/ acute stress of illness/hospitalization and Confusion/decreased Attention noted in her engagements, recommend skilled ST services for ongoing assessment of cognitive-linguistic needs/strengths at her next  venue of care -- hopefully returning to her Home in order to provide therapy services in a known, comfortable environment in a known language environment. This was discussed w/ MD and Family who agreed, pending PT/OT evaluations for safety in the home. NSG updated. ST services can be available if any new needs while admitted. Recommend supporting pt's communication w/ a quiet, calm environment giving her Time to respond; support w/ cues as needed. Family agreed.   SLP Assessment  SLP Recommendation/Assessment: All further Speech Lanaguage Pathology  needs can be addressed in the next venue of care SLP Visit Diagnosis: Dysphagia, unspecified (R13.10);Cognitive communication deficit (R41.841)    Recommendations for follow up therapy are one component of a multi-disciplinary discharge planning process, led by the attending physician.  Recommendations may be updated based on patient status, additional functional criteria and insurance authorization.    Follow Up Recommendations  Home health SLP    Assistance Recommended at Discharge  Frequent or constant Supervision/Assistance  Functional Status Assessment Patient has had a recent decline in their functional status and demonstrates the ability to make significant improvements in function in a reasonable and predictable amount of time.  Frequency and Duration  (n/a)   (n/a)      SLP Evaluation Cognition  Overall Cognitive Status: Impaired/Different from baseline (unsure of full baseline -- see Head CT of old issues) Arousal/Alertness: Awake/alert Orientation Level: Oriented to person;Oriented to place;Disoriented to time;Disoriented to situation (oriented to Family members present) Year:  (did not answer) Attention: Focused;Sustained Focused Attention: Impaired Focused Attention Impairment: Verbal complex;Functional complex Sustained Attention: Impaired Sustained Attention Impairment: Verbal complex;Functional complex Memory:   (CNT) Awareness: Impaired Awareness Impairment: Anticipatory impairment (but difficult to assess) Problem Solving:  (CNT) Executive Function: Reasoning Reasoning: Impaired (CNT well) Behaviors:  (Paucity of speech) Safety/Judgment:  (CNT) Comments: pt did not immediately respond and looked to Family members to respond       Comprehension  Auditory Comprehension Overall Auditory Comprehension:  (difficult to assess today) Yes/No Questions:  (some basic y/n present) Commands:  (some 1 step w/ gentle cue/model) Conversation:  (answered to few direct questions in soft voice) Other Conversation Comments: paucity of speech Interfering Components: Attention Visual Recognition/Discrimination Discrimination: Not tested Reading Comprehension Reading Status: Not tested    Expression Expression Primary Mode of Expression: Verbal Verbal Expression Overall Verbal Expression:  (difficult to assess today) Initiation: Impaired Automatic Speech: Name;Social Response;Counting (WFL) Level of Generative/Spontaneous Verbalization: Phrase (short responses intermittently) Naming:  (hesitant) Interfering Components: Attention Written Expression Dominant Hand: Right Written Expression: Not tested   Oral / Motor  Oral Motor/Sensory Function Overall Oral Motor/Sensory Function: Within functional limits Motor Speech Overall Motor Speech: Appears within functional limits for tasks assessed Respiration: Within functional limits Phonation: Normal;Low vocal intensity Resonance: Within functional limits Articulation: Within functional limitis Intelligibility: Intelligible Motor Planning: Witnin functional limits Motor Speech Errors: Not applicable               Orinda Kenner, MS, CCC-SLP Speech Language Pathologist Rehab Services; [place] [phone] (ascom) Sharel Behne 09/02/2022, 12:40 PM

## 2022-09-02 NOTE — Progress Notes (Signed)
PT Cancellation Note  Patient Details Name: [name] MRN: [id number] DOB: [birth year]   Cancelled Treatment:     PT evaluation and treatment not  completed 2/2 to interpretor services via  video remote interpreting or in person. Therefore, PT will attempt again tomorrow.    Joaquin Music PT DPT 1:26 PM,09/02/22

## 2022-09-02 NOTE — Progress Notes (Signed)
OT Cancellation Note  Patient Details Name: HENRY TRIAL MRN: [id number] DOB: 21-Feb-1941   Cancelled Treatment:    Reason Eval/Treat Not Completed: Patient at procedure or test/ unavailable. OT orders received, chart reviewed. Pt currently off the floor at ultrasound. Will re-attempt as able.   Lanelle Bal  [facility] 09/02/2022, 11:04 AM

## 2022-09-03 DIAGNOSIS — I6381 Other cerebral infarction due to occlusion or stenosis of small artery: Secondary | ICD-10-CM | POA: Diagnosis not present

## 2022-09-03 LAB — HEMOGLOBIN A1C
Hgb A1c MFr Bld: 5.8 % — ABNORMAL HIGH (ref 4.8–5.6)
Mean Plasma Glucose: 120 mg/dL

## 2022-09-03 LAB — BASIC METABOLIC PANEL
Anion gap: 10 (ref 5–15)
BUN: 23 mg/dL (ref 8–23)
CO2: 24 mmol/L (ref 22–32)
Calcium: 8.9 mg/dL (ref 8.9–10.3)
Chloride: 106 mmol/L (ref 98–111)
Creatinine, Ser: 0.78 mg/dL (ref 0.44–1.00)
GFR, Estimated: >60 mL/min (ref >60)
Glucose, Bld: 114 mg/dL — ABNORMAL HIGH (ref 70–99)
Potassium: 3.5 mmol/L (ref 3.5–5.1)
Sodium: 140 mmol/L (ref 135–145)

## 2022-09-03 LAB — CBC WITH DIFFERENTIAL/PLATELET
Abs Immature Granulocytes: 0.03 10*3/uL (ref 0.00–0.07)
Basophils Absolute: 0 10*3/uL (ref 0.0–0.1)
Basophils Relative: 0 %
Eosinophils Absolute: 0.1 10*3/uL (ref 0.0–0.5)
Eosinophils Relative: 1 %
HCT: 42.8 % (ref 36.0–46.0)
Hemoglobin: 13.8 g/dL (ref 12.0–15.0)
Immature Granulocytes: 0 %
Lymphocytes Relative: 17 %
Lymphs Abs: 1.8 10*3/uL (ref 0.7–4.0)
MCH: 30.3 pg (ref 26.0–34.0)
MCHC: 32.2 g/dL (ref 30.0–36.0)
MCV: 94.1 fL (ref 80.0–100.0)
Monocytes Absolute: 0.8 10*3/uL (ref 0.1–1.0)
Monocytes Relative: 8 %
Neutro Abs: 7.6 10*3/uL (ref 1.7–7.7)
Neutrophils Relative %: 74 %
Platelets: 174 10*3/uL (ref 150–400)
RBC: 4.55 MIL/uL (ref 3.87–5.11)
RDW: 12.4 % (ref 11.5–15.5)
WBC: 10.4 10*3/uL (ref 4.0–10.5)
nRBC: 0 % (ref 0.0–0.2)

## 2022-09-03 NOTE — Evaluation (Signed)
Occupational Therapy Evaluation Patient Details Name: JENISHA MCCLASKEY MRN: [id number] DOB: 25-Jul-1940 Today's Date: 09/03/2022   History of Present Illness Pt is a 82 y.o. female presenting to hospital 09/01/22 with confusion, speaking nonsensically, unsteady gait starting 3 days ago following fall.  Recent falls and difficulty speaking; chronic difficulty with R hand (related to tumor)  Imaging showing suspected acute to subacute L thalamic infarct and an additional possible smaller R thalamic infarct; also sequelae of prior L retrosigmoid craniectomy with cranioplasty and R occipital approach VP shunt catheter.  Pt admitted with acute thalamic infarction and hypertensive emergency.  PMH includes htn, HLD, CAD, diastolic CHF, asthma, brain tumor (acoustic neuroma) with resection about 7 years ago at [facility], h/o STEMI 2013 s/p stent to RCA, hydrocephalus s/p VP shunt.   Clinical Impression   Patient presenting with decreased Ind in self care,balance, functional mobility/transfers, endurance, and safety awareness. Patient's grandson and son present in room to provide PLOF. They report pt is very independent at baseline and watches grandchild during the day. She is Ind with self care, IADLs, and does furniture walk on occasion. She lives with her son and daughter in [place].  Patient very lethargic at beginning of session needing max A for supine >sit. Pt with L lateral lean in sitting on EOB with mod A for static sitting balance. Pt unable to follow commands for motor planning and easily distracted in room. Video interpreter utilized and pt distracted by this as well. She reports her grandson is her daughter and unable to verbalize her name. Pt needing mod -max A of 2 to stand and take steps to recliner chair with manual facilitation of weight shift and LE advancement.  OT talked to tech about lateral scoot from chair back into bed for safety. Patient will benefit from acute OT to increase overall independence  in the areas of ADLs, functional mobility, and safety awareness in order to safely discharge to next venue of care.      Recommendations for follow up therapy are one component of a multi-disciplinary discharge planning process, led by the attending physician.  Recommendations may be updated based on patient status, additional functional criteria and insurance authorization.   Follow Up Recommendations  Skilled nursing-short term rehab (<3 hours/day)     Assistance Recommended at Discharge Frequent or constant Supervision/Assistance  Patient can return home with the following Two people to help with bathing/dressing/bathroom;Two people to help with walking and/or transfers;Help with stairs or ramp for entrance;Assist for transportation;Assistance with cooking/housework    Functional Status Assessment  Patient has had a recent decline in their functional status and demonstrates the ability to make significant improvements in function in a reasonable and predictable amount of time.  Equipment Recommendations  Other (comment) (defer to next venue of care)       Precautions / Restrictions Precautions Precautions: Fall Restrictions Weight Bearing Restrictions: No      Mobility Bed Mobility Overal bed mobility: Needs Assistance Bed Mobility: Supine to Sit     Supine to sit: Max assist     General bed mobility comments: assistance for trunk an B LEs    Transfers Overall transfer level: Needs assistance Equipment used: 2 person hand held assist Transfers: Bed to chair/wheelchair/BSC Sit to Stand: Mod assist, +2 physical assistance, +2 safety/equipment     Step pivot transfers: Max assist, Mod assist, +2 physical assistance, +2 safety/equipment            Balance Overall balance assessment: Needs assistance Sitting-balance  support: No upper extremity supported, Feet supported Sitting balance-Leahy Scale: Poor   Postural control: Left lateral lean Standing balance  support: Bilateral upper extremity supported, During functional activity Standing balance-Leahy Scale: Poor                             ADL either performed or assessed with clinical judgement   ADL Overall ADL's : Needs assistance/impaired                         Toilet Transfer: +2 for physical assistance;Moderate assistance;Stand-pivot Toilet Transfer Details (indicate cue type and reason): simulated transfer                 Vision Patient Visual Report: No change from baseline              Pertinent Vitals/Pain Pain Assessment Pain Assessment: Faces Faces Pain Scale: No hurt     Hand Dominance Right   Extremity/Trunk Assessment Upper Extremity Assessment Upper Extremity Assessment: Difficult to assess due to impaired cognition;Generalized weakness   Lower Extremity Assessment Lower Extremity Assessment: Difficult to assess due to impaired cognition;Generalized weakness       Communication Communication Communication: Interpreter utilized   Cognition Arousal/Alertness: Awake/alert, Lethargic Behavior During Therapy: Flat affect Overall Cognitive Status: Impaired/Different from baseline Area of Impairment: Orientation, Attention, Memory, Following commands, Safety/judgement, Awareness, Problem solving                 Orientation Level: Disoriented to, Person, Place, Time, Situation Current Attention Level: Focused Memory: Decreased recall of precautions   Safety/Judgement: Decreased awareness of safety, Decreased awareness of deficits Awareness: Intellectual Problem Solving: Slow processing, Decreased initiation, Difficulty sequencing, Requires verbal cues, Requires tactile cues General Comments: Pt unable to follow simple commands, unable to verbalize name, and is distracted by environment including video interpreter. She did report her grandson in room was her daughter.                Home Living Family/patient expects  to be discharged to:: Private residence Living Arrangements: Children (Pt's son and daughter in law) Available Help at Discharge: Family Type of Home: House Home Access: Level entry     Home Layout: One level     Bathroom Shower/Tub: Teacher, early years/pre: Standard         Additional Comments: Pt's grandson reports no DME at home but previous documentation showing pt may have manual w/c, RW, and BSC/3 in 1.  Lives With: Family    Prior Functioning/Environment Prior Level of Function : Needs assist             Mobility Comments: Pt's grandson reports pt walks slow and holds onto furniture when walking; family prefers to assist pt with walking for safety. ADLs Comments: Pt is Ind with self care and IADLs        OT Problem List: Decreased strength;Decreased activity tolerance;Decreased safety awareness;Impaired balance (sitting and/or standing);Decreased knowledge of use of DME or AE;Decreased cognition;Decreased knowledge of precautions      OT Treatment/Interventions: Self-care/ADL training;Therapeutic exercise;Therapeutic activities;DME and/or AE instruction;Patient/family education;Balance training;Energy conservation;Cognitive remediation/compensation;Neuromuscular education    OT Goals(Current goals can be found in the care plan section) Acute Rehab OT Goals Patient Stated Goal: to get better and go to rehab OT Goal Formulation: With patient/family Time For Goal Achievement: 09/17/22 Potential to Achieve Goals: Fair ADL Goals Pt Will Perform Grooming: with min  assist;standing Pt Will Perform Lower Body Dressing: with min assist;sit to/from stand Pt Will Transfer to Toilet: with min assist;ambulating Pt Will Perform Toileting - Clothing Manipulation and hygiene: with min assist;sit to/from stand  OT Frequency: Min 2X/week       AM-PAC OT "6 Clicks" Daily Activity     Outcome Measure Help from another person eating meals?: A Lot Help from another  person taking care of personal grooming?: A Lot Help from another person toileting, which includes using toliet, bedpan, or urinal?: Total Help from another person bathing (including washing, rinsing, drying)?: Total Help from another person to put on and taking off regular upper body clothing?: A Lot Help from another person to put on and taking off regular lower body clothing?: Total 6 Click Score: 9   End of Session Nurse Communication: Mobility status  Activity Tolerance: Patient limited by fatigue Patient left: in chair;with chair alarm set;with call bell/phone within reach;with family/visitor present  OT Visit Diagnosis: Unsteadiness on feet (R26.81);Muscle weakness (generalized) (M62.81);Repeated falls (R29.6);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

## 2022-09-03 NOTE — Evaluation (Signed)
Physical Therapy Evaluation Patient Details Name: [name] MRN: [id number] DOB: Sep 13, 1940 Today's Date: 09/03/2022  History of Present Illness  Pt is a 82 y.o. female presenting to hospital 09/01/22 with confusion, speaking nonsensically, unsteady gait starting 3 days ago following fall.  Recent falls and difficulty speaking; chronic difficulty with R hand (related to tumor)  Imaging showing suspected acute to subacute L thalamic infarct and an additional possible smaller R thalamic infarct; also sequelae of prior L retrosigmoid craniectomy with cranioplasty and R occipital approach VP shunt catheter.  Pt admitted with acute thalamic infarction and hypertensive emergency.  PMH includes htn, HLD, CAD, diastolic CHF, asthma, brain tumor (acoustic neuroma) with resection about 7 years ago at [facility], h/o STEMI 2013 s/p stent to RCA, hydrocephalus s/p VP shunt.  Clinical Impression  Prior to hospital admission, pt's grandson (who was present during session) reports pt was ambulatory in home (pt walking slow and holding onto furniture; family prefers to assist pt with walking); lives with her son and son's wife in 1 level home (level entry).  [place] interpreter (VRI video interpreter) utilized during session Joellen [place] [phone]).  Pt oriented to name and month/day of DOB only; pt reported she was at a [employer]" for a check up and it was Nov 24, 1983.  Pt often closing her eyes during session but would open with cues; inconsistent with following 1 steps cues (sometimes pt would require extra time to follow cues and other times pt did not follow cues at all).  Currently pt is mod assist semi-supine to sitting edge of bed; min assist x2 to stand from bed; and CGA to min assist x2 to ambulate a couple feet (short shuffling gait) with RW use.  Limited distance ambulating d/t pt incontinent of urine and unsafe to continue walking so bed brought behind pt so pt able to sit down and then pt assisted back to bed  with 2 assist (nurse and NT present with pt end of session assisting pt with clean-up and bed linen change).  Pt would currently benefit from skilled PT to address noted impairments and functional limitations (see below for any additional details).  Upon hospital discharge, pt would benefit from ongoing therapy.    Recommendations for follow up therapy are one component of a multi-disciplinary discharge planning process, led by the attending physician.  Recommendations may be updated based on patient status, additional functional criteria and insurance authorization.  Follow Up Recommendations Skilled nursing-short term rehab (<3 hours/day) Can patient physically be transported by private vehicle: No    Assistance Recommended at Discharge Frequent or constant Supervision/Assistance  Patient can return home with the following  Two people to help with walking and/or transfers;A lot of help with bathing/dressing/bathroom;Assistance with cooking/housework;Direct supervision/assist for medications management;Direct supervision/assist for financial management;Assist for transportation;Help with stairs or ramp for entrance    Equipment Recommendations Rolling walker (2 wheels);BSC/3in1;Wheelchair (measurements PT);Wheelchair cushion (measurements PT) (youth sized)  Recommendations for Other Services       Functional Status Assessment Patient has had a recent decline in their functional status and demonstrates the ability to make significant improvements in function in a reasonable and predictable amount of time.     Precautions / Restrictions Precautions Precautions: Fall Restrictions Weight Bearing Restrictions: No      Mobility  Bed Mobility Overal bed mobility: Needs Assistance Bed Mobility: Supine to Sit, Sit to Supine     Supine to sit: Mod assist, HOB elevated Sit to supine: Min assist, +2 for physical  assistance   General bed mobility comments: assist for trunk and B LE's;  vc's/visual cues for technique    Transfers Overall transfer level: Needs assistance Equipment used: Rolling walker (2 wheels) Transfers: Sit to/from Stand Sit to Stand: Min assist, +2 physical assistance           General transfer comment: assist to initiate stand and control descent sitting; vc's/visual cues for technique    Ambulation/Gait Ambulation/Gait assistance: Min guard, Min assist, +2 physical assistance Gait Distance (Feet): 2 Feet Assistive device: Rolling walker (2 wheels)   Gait velocity: decreased     General Gait Details: pt with short shuffling gait; limited d/t urinary incontinence  Stairs            Wheelchair Mobility    Modified Rankin (Stroke Patients Only)       Balance Overall balance assessment: Needs assistance Sitting-balance support: No upper extremity supported, Feet supported Sitting balance-Leahy Scale: Fair Sitting balance - Comments: pt steady static sitting on edge of bed   Standing balance support: Bilateral upper extremity supported, During functional activity, Reliant on assistive device for balance Standing balance-Leahy Scale: Fair Standing balance comment: CGA x2 for safety static standing                             Pertinent Vitals/Pain Pain Assessment Pain Assessment: No/denies pain Vitals (HR and O2 on room air) stable and WFL throughout treatment session.    Home Living Family/patient expects to be discharged to:: Private residence Living Arrangements: Children (Pt's son and son's wife) Available Help at Discharge: Family Type of Home: House Home Access: Level entry       Home Layout: One level   Additional Comments: Pt's grandson reports no DME at home but previous documentation showing pt may have manual w/c, RW, and BSC/3 in 1.    Prior Function Prior Level of Function : Needs assist             Mobility Comments: Pt's grandson reports pt walks slow and holds onto furniture when  walking; family prefers to assist pt with walking for safety.       Hand Dominance        Extremity/Trunk Assessment   Upper Extremity Assessment Upper Extremity Assessment: Difficult to assess due to impaired cognition (Per chart pt with chronic difficulty with R hand (related to tumor))    Lower Extremity Assessment Lower Extremity Assessment: Difficult to assess due to impaired cognition (appearing at least 3/5 in general B LE's)       Communication   Communication: Interpreter utilized  Cognition Arousal/Alertness: Awake/alert Behavior During Therapy: Flat affect Overall Cognitive Status: Impaired/Different from baseline                                 General Comments: Oriented to name and month/day of DOB (not year); pt reporting she was at the [employer]" for a check up and it was Nov 24 1983.        General Comments  Nursing cleared pt for participation in physical therapy.  Pt agreeable to PT session.  Pt's grandson present during session (working from his computer).    Exercises     Assessment/Plan    PT Assessment Patient needs continued PT services  PT Problem List Decreased strength;Decreased activity tolerance;Decreased balance;Decreased mobility;Decreased cognition;Decreased knowledge of use of DME;Decreased knowledge of precautions;Decreased safety awareness  PT Treatment Interventions DME instruction;Gait training;Functional mobility training;Therapeutic activities;Therapeutic exercise;Balance training;Patient/family education    PT Goals (Current goals can be found in the Care Plan section)  Acute Rehab PT Goals Patient Stated Goal: to improve mobility PT Goal Formulation: With patient/family Time For Goal Achievement: 09/17/22 Potential to Achieve Goals: Fair    Frequency 7X/week     Co-evaluation               AM-PAC PT "6 Clicks" Mobility  Outcome Measure Help needed turning from your back to your side while  in a flat bed without using bedrails?: A Little Help needed moving from lying on your back to sitting on the side of a flat bed without using bedrails?: A Lot Help needed moving to and from a bed to a chair (including a wheelchair)?: A Lot Help needed standing up from a chair using your arms (e.g., wheelchair or bedside chair)?: A Lot Help needed to walk in hospital room?: Total Help needed climbing 3-5 steps with a railing? : Total 6 Click Score: 11    End of Session Equipment Utilized During Treatment: Gait belt Activity Tolerance: Patient tolerated treatment well Patient left: in bed;Other (comment) (nurse and NT present assisting with pt clean-up and bed linen change) Nurse Communication: Mobility status;Precautions;Other (comment) (pt needing clean-up and bed linen change) PT Visit Diagnosis: Other abnormalities of gait and mobility (R26.89);Muscle weakness (generalized) (M62.81);History of falling (Z91.81)    Time: 1010-1042 PT Time Calculation (min) (ACUTE ONLY): 32 min   Charges:   PT Evaluation $PT Eval Low Complexity: 1 Low PT Treatments $Therapeutic Activity: 8-22 mins       Leitha Bleak, PT 09/03/22, 11:16 AM

## 2022-09-03 NOTE — Progress Notes (Signed)
Progress Note   Patient: [name] DOB: 1940-10-18 DOA: 09/01/2022     2 DOS: the patient was seen and examined on 09/03/2022   Subjective: Patient seen and examined this morning in the presence of patient's son Appears to have some intermittent confusion today Patient was able to speak however in low tone Denies chest pain nausea vomiting abdominal pain or worsening weakness   Brief [name] course: [name] is a 82 y.o. 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  She did not suffer any injuries from the fall.  Patient has no history of dementia, no history of starting any new medication. EMS recorded a systolic blood pressure of 220.  History is given by daughter and granddaughter at the bedside who also served as interpreters.  Daughter states that when she asked her mother question the responses totally unrelated to the question asked and does not make sense. ED course and data review: BP as high as 190/84 with otherwise normal vitals.  Labs unremarkable.  EKG, personally viewed and interpreted showing sinus at 78 with no acute ST-T wave changes.  CT head showing acute to subacute thalamic infarct as detailed below:   Assessment and Plan: Acute thalamic infarction [name]) CT shows :acute versus subacute medial left thalamic infarct. Equivocal smaller infarct in the medial right thalamus. Stroke likely happened 3 days prior given onset of symptoms followed in fall 3 days prior Blood pressure control  Continue home atorvastatin 80 and check LDL Continue ASA '81mg'$  daily, Plavix '75mg'$  daily x 3 weeks then monotherapy thereafter (patient got full dose aspirin in the ED) Continue telemetry,  Follow-up on  echocardiogram, carotid Doppler Patient has been cleared by speech therapist to have a diet PT OT on board we appreciate input PT OT have recommended skilled nursing facility placement Neurologist on board     VP (ventriculoperitoneal) shunt status for hydrocephalus Ventriculostomy catheter in correct placement per CT.  No ventriculomegaly   S/P excision of acoustic neuroma 2014 No acute issues suspected based on head CT   Chronic diastolic CHF (congestive heart failure) (HCC) Clinically euvolemic Continue lisinopril and metoprolol   Asthma Not acutely exacerbated Albuterol as needed   Hypertensive emergency We allowed permissive hypertension for the first 24 hours Blood pressure medications being continued at this time   CAD S/P percutaneous coronary angioplasty History of STEMI 2013 s/p bare-metal stent to RCA No complaints of chest pain, EKG nonacute Continue atorvastatin, clopidogrel, metoprolol and nitroglycerin as needed chest pain   DVT prophylaxis: Lovenox   Consults: none (neuro in the am)   Advance Care Planning:   Code Status: Prior    Family Communication: Daughter and granddaughter at bedside   Disposition Plan: Back to previous home environment     Physical Exam Vitals and nursing note reviewed.  Constitutional:      General: She is not in acute distress. HENT:     Head: Normocephalic and atraumatic.  Cardiovascular:     Rate and Rhythm: Normal rate and regular rhythm.     Heart sounds: Normal heart sounds.  Pulmonary:     Effort: Pulmonary effort is normal.     Breath sounds: Normal breath sounds.  Abdominal:     Palpations: Abdomen is soft.  Tenderness: There is no abdominal tenderness.  Able to move lower extremities but with some difficulty Able to move bilateral upper extremities however slightly weaker on the right     Data Reviewed: I personally reviewed patient's lab results CBC, and BMP   Family Communication: Plan of care  discussed with patient's son present at bedside   Disposition: Status is: Inpatient Patient still remains inpatient on account of having acute stroke undergoing stroke workup  Planned Discharge Destination: Skilled nursing facility       Time spent: 40 minutes  Vitals:   09/03/22 0441 09/03/22 0809 09/03/22 1303 09/03/22 1500  BP: (!) 187/75 (!) 166/80 (!) 155/67 (!) 143/71  Pulse: 76 83 72 79  Resp: '20 18 14 16  '$ Temp: 99.5 F (37.5 C) 98.1 F (36.7 C) 98.4 F (36.9 C) 98.6 F (37 C)  TempSrc:  Oral Oral Oral  SpO2: 99% 94% 95% 99%  Weight:        Author: Verline Lema, MD 09/03/2022 5:07 PM  For on call review [email or web address].

## 2022-09-03 NOTE — TOC Initial Note (Signed)
Transition of Care [facility]) - Initial/Assessment Note    Patient Details  Name: [name] MRN: [id number] Date of Birth: [birth year]  Transition of Care [facility]) CM/SW Contact:    Laurena Slimmer, RN Phone Number: 09/03/2022, 10:06 AM  Clinical Narrative:                 Case reviewed for DME needs and disposition changes.         Patient Goals and CMS Choice            Expected Discharge Plan and Services                                              Prior Living Arrangements/Services                       Activities of Daily Living Home Assistive Devices/Equipment: None ADL Screening (condition at time of admission) Patient's cognitive ability adequate to safely complete daily activities?: No Is the patient deaf or have difficulty hearing?: No Does the patient have difficulty seeing, even when wearing glasses/contacts?: No Does the patient have difficulty concentrating, remembering, or making decisions?: No Patient able to express need for assistance with ADLs?: Yes Does the patient have difficulty dressing or bathing?: Yes Independently performs ADLs?: No Communication: Independent Dressing (OT): Needs assistance Is this a change from baseline?: Pre-admission baseline Grooming: Needs assistance Is this a change from baseline?: Pre-admission baseline Feeding: Needs assistance Is this a change from baseline?: Pre-admission baseline Bathing: Needs assistance Is this a change from baseline?: Pre-admission baseline Toileting: Needs assistance Is this a change from baseline?: Pre-admission baseline In/Out Bed: Needs assistance Is this a change from baseline?: Pre-admission baseline Walks in Home: Needs assistance Is this a change from baseline?: Pre-admission baseline Does the patient have difficulty walking or climbing stairs?: Yes Weakness of Legs: Both Weakness of Arms/Hands: Both  Permission Sought/Granted                  Emotional  Assessment              Admission diagnosis:  Acute CVA (cerebrovascular accident) ([place]) [I63.9] Cerebrovascular accident (CVA), unspecified mechanism ([place]) [I63.9] Patient Active Problem List   Diagnosis Date Noted   Hypertensive emergency 09/01/2022   Asthma 09/01/2022   Chronic diastolic CHF (congestive heart failure) ([place]) 09/01/2022   S/P excision of acoustic neuroma 2014 09/01/2022   VP (ventriculoperitoneal) shunt status for hydrocephalus 09/01/2022   Acute thalamic infarction ([place]) 09/01/2022   NSTEMI (non-ST elevated myocardial infarction) ([place]) 07/15/2018   ST elevation myocardial infarction (STEMI) of inferior wall ([place]) 08/21/2011   CAD S/P percutaneous coronary angioplasty 08/21/2011   Hydrocephalus ([place]) 08/21/2011   Acoustic neuroma ([place]) 08/21/2011   HTN (hypertension) 08/20/2011   Hypercholesterolemia 08/20/2011   PCP:  Albina Billet, MD Pharmacy:   [id number] [place] - [address]: [phone]: [phone] [id number][place] [address]: [phone]: [phone]    Social Determinants of Health (SDOH) Social History: SDOH Screenings   Tobacco Use: Low Risk  (09/01/2022)   SDOH Interventions:     Readmission  Risk Interventions     No data to display

## 2022-09-04 DIAGNOSIS — I6381 Other cerebral infarction due to occlusion or stenosis of small artery: Secondary | ICD-10-CM | POA: Diagnosis not present

## 2022-09-04 LAB — CBC WITH DIFFERENTIAL/PLATELET
Abs Immature Granulocytes: 0.03 10*3/uL (ref 0.00–0.07)
Basophils Absolute: 0 10*3/uL (ref 0.0–0.1)
Basophils Relative: 0 %
Eosinophils Absolute: 0 10*3/uL (ref 0.0–0.5)
Eosinophils Relative: 0 %
HCT: 37.3 % (ref 36.0–46.0)
Hemoglobin: 12.8 g/dL (ref 12.0–15.0)
Immature Granulocytes: 0 %
Lymphocytes Relative: 20 %
Lymphs Abs: 1.8 10*3/uL (ref 0.7–4.0)
MCH: 30.7 pg (ref 26.0–34.0)
MCHC: 34.3 g/dL (ref 30.0–36.0)
MCV: 89.4 fL (ref 80.0–100.0)
Monocytes Absolute: 1 10*3/uL (ref 0.1–1.0)
Monocytes Relative: 11 %
Neutro Abs: 6.3 10*3/uL (ref 1.7–7.7)
Neutrophils Relative %: 69 %
Platelets: 147 10*3/uL — ABNORMAL LOW (ref 150–400)
RBC: 4.17 MIL/uL (ref 3.87–5.11)
RDW: 12.9 % (ref 11.5–15.5)
WBC: 9.2 10*3/uL (ref 4.0–10.5)
nRBC: 0 % (ref 0.0–0.2)

## 2022-09-04 LAB — BASIC METABOLIC PANEL
Anion gap: 6 (ref 5–15)
BUN: 16 mg/dL (ref 8–23)
CO2: 25 mmol/L (ref 22–32)
Calcium: 8.4 mg/dL — ABNORMAL LOW (ref 8.9–10.3)
Chloride: 106 mmol/L (ref 98–111)
Creatinine, Ser: 0.71 mg/dL (ref 0.44–1.00)
GFR, Estimated: >60 mL/min (ref >60)
Glucose, Bld: 91 mg/dL (ref 70–99)
Potassium: 3.5 mmol/L (ref 3.5–5.1)
Sodium: 137 mmol/L (ref 135–145)

## 2022-09-04 MED ORDER — LISINOPRIL 20 MG PO TABS
20.0000 mg | ORAL_TABLET | Freq: Every day | ORAL | Status: DC
  Administered 2022-09-04 – 2022-09-07 (×4): 20 mg via ORAL
  Filled 2022-09-04 (×5): qty 1

## 2022-09-04 MED ORDER — AMLODIPINE BESYLATE 5 MG PO TABS
5.0000 mg | ORAL_TABLET | Freq: Every day | ORAL | Status: DC
  Administered 2022-09-04 – 2022-09-07 (×4): 5 mg via ORAL
  Filled 2022-09-04 (×5): qty 1

## 2022-09-04 NOTE — Progress Notes (Signed)
Notified Dr. Rande Brunt that patient's BP is 186/59 with HR in 60's. MD stated he will order amlodipine.

## 2022-09-04 NOTE — TOC Progression Note (Signed)
Transition of Care [facility]) - Progression Note    Patient Details  Name: [name] MRN: [id number] Date of Birth: 07-09-1940  Transition of Care [facility]) CM/SW Contact  Laurena Slimmer, RN Phone Number: 09/04/2022, 2:41 PM  Clinical Narrative:    Case reviewed for DME and disposition         Expected Discharge Plan and Services                                               Social Determinants of Health (SDOH) Interventions SDOH Screenings   Tobacco Use: Low Risk  (09/01/2022)    Readmission Risk Interventions     No data to display

## 2022-09-04 NOTE — Progress Notes (Signed)
Physical Therapy Treatment Patient Details Name: [name] MRN: [id number] DOB: [birth year] Today's Date: 09/04/2022   History of Present Illness Pt is a 82 y.o. female presenting to hospital 09/01/22 with confusion, speaking nonsensically, unsteady gait starting 3 days ago following fall.  Recent falls and difficulty speaking; chronic difficulty with R hand (related to tumor)  Imaging showing suspected acute to subacute L thalamic infarct and an additional possible smaller R thalamic infarct; also sequelae of prior L retrosigmoid craniectomy with cranioplasty and R occipital approach VP shunt catheter.  Pt admitted with acute thalamic infarction and hypertensive emergency.  PMH includes htn, HLD, CAD, diastolic CHF, asthma, brain tumor (acoustic neuroma) with resection about 7 years ago at [facility], h/o STEMI 2013 s/p stent to RCA, hydrocephalus s/p VP shunt.    PT Comments    Pt resting in bed upon PT arrival; pt's daughter in law present and providing encouragement and support to pt during session; [place] interpreter (via [employer]) utilized during session [employer] ID [id number]).  Pt oriented to name and year of birthday only; inconsistent with following 1 step cues (verbal, tactile, and visual) and requiring extra time to follow cues when she did follow cues.  No c/o pain.  During session pt mod to max assist semi-supine to sitting edge of bed; min assist x2 to stand from bed up to RW; and CGA to min assist x2 to ambulate a few feet bed to recliner with RW use (pt had difficulty following cues to take steps forward or to chair and at times pt standing and weight shifting in place or just taking steps in place requiring extra time and cueing).  Will continue to focus on strengthening and progressive functional mobility during hospitalization.   Recommendations for follow up therapy are one component of a multi-disciplinary discharge planning process, led by the attending  physician.  Recommendations may be updated based on patient status, additional functional criteria and insurance authorization.  Follow Up Recommendations  Skilled nursing-short term rehab (<3 hours/day) Can patient physically be transported by private vehicle: No   Assistance Recommended at Discharge Frequent or constant Supervision/Assistance  Patient can return home with the following Two people to help with walking and/or transfers;A lot of help with bathing/dressing/bathroom;Assistance with cooking/housework;Direct supervision/assist for medications management;Direct supervision/assist for financial management;Assist for transportation;Help with stairs or ramp for entrance   Equipment Recommendations  Rolling walker (2 wheels);BSC/3in1;Wheelchair (measurements PT);Wheelchair cushion (measurements PT) (youth sized)    Recommendations for Other Services       Precautions / Restrictions Precautions Precautions: Fall Restrictions Weight Bearing Restrictions: No     Mobility  Bed Mobility Overal bed mobility: Needs Assistance Bed Mobility: Supine to Sit     Supine to sit: Mod assist, Max assist, HOB elevated     General bed mobility comments: assistance for trunk an B LEs    Transfers Overall transfer level: Needs assistance Equipment used: Rolling walker (2 wheels) Transfers: Sit to/from Stand Sit to Stand: Min assist, +2 physical assistance           General transfer comment: min assist x2 to stand from bed; vc's, tactile cues, and visual cues required    Ambulation/Gait Ambulation/Gait assistance: Min guard, Min assist, +2 physical assistance Gait Distance (Feet): 3 Feet (bed to recliner) Assistive device: Rolling walker (2 wheels)   Gait velocity: decreased     General Gait Details: short shuffling gait; limited d/t pt's difficulty following cues   Stairs  Wheelchair Mobility    Modified Rankin (Stroke Patients Only)        Balance Overall balance assessment: Needs assistance Sitting-balance support: No upper extremity supported, Feet supported Sitting balance-Leahy Scale: Fair Sitting balance - Comments: steady static sitting edge of bed   Standing balance support: Bilateral upper extremity supported, During functional activity Standing balance-Leahy Scale: Poor Standing balance comment: CGA x2 for safety static standing                            Cognition Arousal/Alertness: Awake/alert Behavior During Therapy: Flat affect Overall Cognitive Status: Impaired/Different from baseline Area of Impairment: Orientation, Attention, Memory, Following commands, Safety/judgement, Awareness, Problem solving                 Orientation Level: Disoriented to, Person, Place, Time, Situation (Oriented to name and year of birth only) Current Attention Level: Focused Memory: Decreased recall of precautions Following Commands: Follows one step commands inconsistently Safety/Judgement: Decreased awareness of safety, Decreased awareness of deficits Awareness: Intellectual Problem Solving: Slow processing, Decreased initiation, Difficulty sequencing, Requires verbal cues, Requires tactile cues          Exercises      General Comments  Nursing cleared pt for participation in physical therapy.  Pt agreeable to PT session.      Pertinent Vitals/Pain Pain Assessment Pain Assessment: Faces Faces Pain Scale: No hurt Pain Intervention(s): Limited activity within patient's tolerance, Monitored during session, Repositioned HR 56-60 bpm and O2 sats WFL on room air during sessions activities.    Home Living                          Prior Function            PT Goals (current goals can now be found in the care plan section) Acute Rehab PT Goals Patient Stated Goal: to improve mobility PT Goal Formulation: With patient/family Time For Goal Achievement: 09/17/22 Potential to Achieve  Goals: Fair Progress towards PT goals: Progressing toward goals    Frequency    7X/week      PT Plan Current plan remains appropriate    Co-evaluation              AM-PAC PT "6 Clicks" Mobility   Outcome Measure  Help needed turning from your back to your side while in a flat bed without using bedrails?: A Little Help needed moving from lying on your back to sitting on the side of a flat bed without using bedrails?: A Lot Help needed moving to and from a bed to a chair (including a wheelchair)?: Total Help needed standing up from a chair using your arms (e.g., wheelchair or bedside chair)?: Total Help needed to walk in hospital room?: Total Help needed climbing 3-5 steps with a railing? : Total 6 Click Score: 9    End of Session Equipment Utilized During Treatment: Gait belt Activity Tolerance: Patient tolerated treatment well Patient left: in chair;with call bell/phone within reach;with chair alarm set;with family/visitor present;Other (comment) (pt's daughter in law present) Nurse Communication: Mobility status;Precautions;Other (comment).  Pt daughter in law reports she will call nursing if she leaves before next family member arrives so pt is not left up in chair on her own (d/t safety concerns d/t pt's confusion; chair alarm on)--discussed with pt's nurse. PT Visit Diagnosis: Other abnormalities of gait and mobility (R26.89);Muscle weakness (generalized) (M62.81);History of falling (Z91.81)  Time: [id number] PT Time Calculation (min) (ACUTE ONLY): 43 min  Charges:  $Therapeutic Activity: 38-52 mins                     Leitha Bleak, PT 09/04/22, 11:35 AM

## 2022-09-04 NOTE — Progress Notes (Signed)
Progress Note   Patient: [name] [id number] DOB: April 09, 1941 DOA: 09/01/2022     3 DOS: the patient was seen and examined on 09/04/2022    Subjective: Patient seen and examined this morning  Appears to have some intermittent confusion today however obeying commands Patient was able to speak however in low tone Denies chest pain nausea vomiting abdominal pain or worsening weakness   Brief hospital course: [name] is a 82 y.o. 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  She did not suffer any injuries from the fall.  Patient has no history of dementia, no history of starting any new medication. EMS recorded a systolic blood pressure of 220.  History is given by daughter and granddaughter at the bedside who also served as interpreters.  Daughter states that when she asked her mother question the responses totally unrelated to the question asked and does not make sense. ED course and data review: BP as high as 190/84 with otherwise normal vitals.  Labs unremarkable.  EKG, personally viewed and interpreted showing sinus at 78 with no acute ST-T wave changes.  CT head showing acute to subacute thalamic infarct   Assessment and Plan: Acute thalamic infarction [facility]) CT shows :acute versus subacute medial left thalamic infarct. Equivocal smaller infarct in the medial right thalamus. Stroke likely happened 3 days prior given onset of symptoms followed in fall 3 days prior to presentation Blood pressure control  Continue home atorvastatin 80 and check LDL Continue ASA '81mg'$  daily, Plavix '75mg'$  daily x 3 weeks then monotherapy thereafter (patient got full dose aspirin in the ED) Continue telemetry,  Echocardiogram showed  ejection fraction of 60 to 65%  Carotid ultrasound showed atherosclerotic plaque involving bilateral carotid with estimated stenosis less than 50% bilaterally. Patient has been cleared by speech therapist to have a diet PT OT have recommended skilled nursing facility placement Neurologist on board Case management working on skilled nursing facility placement     VP (ventriculoperitoneal) shunt status for hydrocephalus Ventriculostomy catheter in correct placement per CT.  No ventriculomegaly   S/P excision of acoustic neuroma 2014 No acute issues suspected based on head CT   Chronic diastolic CHF (congestive heart failure) (HCC) Clinically euvolemic Continue metoprolol Dose of lisinopril have been adjusted upward today for adequate blood pressure control   Asthma Not acutely exacerbated Albuterol as needed   Hypertensive emergency We allowed permissive hypertension for the first 24 hours Blood pressure medications being continued at this time   CAD S/P percutaneous coronary angioplasty History of STEMI 2013 s/p bare-metal stent to RCA No complaints of chest pain, EKG nonacute Continue atorvastatin, clopidogrel, metoprolol and nitroglycerin as needed chest pain   DVT prophylaxis: Lovenox   Consults: Neurology is on board   Advance Care Planning:   Code Status: Prior    Family Communication: Son at bedside   Disposition Plan: Skilled nursing facility placement     Physical Exam Vitals and nursing note reviewed.  Constitutional:      General: She is not in acute distress. HENT:     Head: Normocephalic and atraumatic.  Cardiovascular:     Rate and Rhythm: Normal rate and regular rhythm.     Heart sounds: Normal heart sounds.  Pulmonary:  Effort: Pulmonary effort is normal.     Breath sounds: Normal breath sounds.  Abdominal:     Palpations: Abdomen is soft.     Tenderness: There is no abdominal tenderness.  Able to move lower extremities but with some  difficulty Able to move bilateral upper extremities however slightly weaker on the right     Data Reviewed: I personally reviewed patient's lab results CBC, and BMP    Family Communication: Plan of care discussed with patient's son present at bedside   Disposition: Status is: Inpatient Patient still remains inpatient on account of having acute stroke undergoing stroke workup   Planned Discharge Destination: Skilled nursing facility     Physical Exam: Vitals:   09/04/22 0405 09/04/22 0755 09/04/22 1018 09/04/22 1153  BP: (!) 161/50 (!) 156/88  (!) 154/74  Pulse: 64 (!) 59 63 (!) 58  Resp: '20 18  18  '$ Temp: 98.8 F (37.1 C) 97.9 F (36.6 C)  98.3 F (36.8 C)  TempSrc:      SpO2: 98% 100%  98%  Weight:        Author: Verline Lema, MD 09/04/2022 4:45 PM  For on call review [email or web address].

## 2022-09-05 DIAGNOSIS — I6381 Other cerebral infarction due to occlusion or stenosis of small artery: Secondary | ICD-10-CM | POA: Diagnosis not present

## 2022-09-05 LAB — CBC WITH DIFFERENTIAL/PLATELET
Abs Immature Granulocytes: 0.06 10*3/uL (ref 0.00–0.07)
Basophils Absolute: 0 10*3/uL (ref 0.0–0.1)
Basophils Relative: 0 %
Eosinophils Absolute: 0 10*3/uL (ref 0.0–0.5)
Eosinophils Relative: 0 %
HCT: 39.4 % (ref 36.0–46.0)
Hemoglobin: 13 g/dL (ref 12.0–15.0)
Immature Granulocytes: 1 %
Lymphocytes Relative: 16 %
Lymphs Abs: 1.9 10*3/uL (ref 0.7–4.0)
MCH: 30.7 pg (ref 26.0–34.0)
MCHC: 33 g/dL (ref 30.0–36.0)
MCV: 93.1 fL (ref 80.0–100.0)
Monocytes Absolute: 1.1 10*3/uL — ABNORMAL HIGH (ref 0.1–1.0)
Monocytes Relative: 9 %
Neutro Abs: 9 10*3/uL — ABNORMAL HIGH (ref 1.7–7.7)
Neutrophils Relative %: 74 %
Platelets: 155 10*3/uL (ref 150–400)
RBC: 4.23 MIL/uL (ref 3.87–5.11)
RDW: 12.3 % (ref 11.5–15.5)
WBC: 12.1 10*3/uL — ABNORMAL HIGH (ref 4.0–10.5)
nRBC: 0 % (ref 0.0–0.2)

## 2022-09-05 LAB — BASIC METABOLIC PANEL
Anion gap: 9 (ref 5–15)
BUN: 20 mg/dL (ref 8–23)
CO2: 25 mmol/L (ref 22–32)
Calcium: 9 mg/dL (ref 8.9–10.3)
Chloride: 102 mmol/L (ref 98–111)
Creatinine, Ser: 0.75 mg/dL (ref 0.44–1.00)
GFR, Estimated: >60 mL/min (ref >60)
Glucose, Bld: 116 mg/dL — ABNORMAL HIGH (ref 70–99)
Potassium: 3.6 mmol/L (ref 3.5–5.1)
Sodium: 136 mmol/L (ref 135–145)

## 2022-09-05 NOTE — Progress Notes (Signed)
PROGRESS NOTE    [name]   [id number] DOB: 09-24-1940  DOA: 09/01/2022 Date of Service: 09/05/22 PCP: Albina Billet, MD     Brief Narrative / Hospital Course:  [name] is a 82 y.o. 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  EMS recorded a systolic blood pressure of 220. 03/02: BP as high as 190/84 with otherwise normal vitals.  Labs unremarkable.  EKG, personally viewed and interpreted showing sinus at 78 with no acute ST-T wave changes.  CT head showing acute to subacute thalamic infarct. Stroke likely happened 3 days prior given onset of symptoms followed in fall 3 days prior to presentation. Admitted to hospitalist service late evening 03/03: neurology saw patient - Continue ASA/Plavix, permissive HTN and stroke work-up. Echo done. SLP saw patient and cleared for diet.  03/04: PT/OT recs for SNF. 03/05 - 03/06: SNF placement pending. 03/06 eval for CIR     Consultants:  Neurology   Procedures: none      ASSESSMENT & PLAN:   Principal Problem:   Acute thalamic infarction [facility]) Active Problems:   CAD S/P percutaneous coronary angioplasty   Hypertensive emergency   Asthma   Chronic diastolic CHF (congestive heart failure) ([place])   S/P excision of acoustic neuroma 2014   VP (ventriculoperitoneal) shunt status for hydrocephalus  Acute thalamic infarction ([place]) CT head showing acute to subacute thalamic infarct.  Echocardiogram showed ejection fraction of 60 to 65%  Carotid ultrasound showed atherosclerotic plaque involving bilateral carotid with estimated stenosis less than 50% bilaterally.Blood pressure control  Continue home atorvastatin 80  Continue ASA '81mg'$  daily,  Plavix '75mg'$  daily x 3 weeks then monotherapy thereafter (patient got full dose aspirin in the ED) Patient has been cleared by speech therapist to have a diet PT OT have recommended skilled nursing facility placement vs CIR     VP (ventriculoperitoneal) shunt status for hydrocephalus Ventriculostomy catheter in correct placement per CT.   No ventriculomegaly   S/P excision of acoustic neuroma 2014 No acute issues suspected based on head CT   Chronic diastolic CHF (congestive heart failure) ([facility]) Clinically euvolemic Continue metoprolol Dose of lisinopril have been adjusted upward for adequate blood pressure control   Asthma Not acutely exacerbated Albuterol as needed   Hypertensive emergency We allowed permissive hypertension for the first 24 hours Blood pressure medications being continued at this time   CAD S/P percutaneous coronary angioplasty History of STEMI 2013 s/p bare-metal stent to RCA No complaints of chest pain, EKG nonacute Continue atorvastatin, clopidogrel, metoprolol and nitroglycerin as needed chest pain     DVT prophylaxis: lovenox Pertinent IV fluids/nutrition: po diet no IV fluids  Central lines / invasive devices: none  Code Status: FULL CODE  Current Admission Status: inpatient   TOC needs / Dispo plan: placement Barriers to discharge / significant pending items: CIR pending eval              Subjective / Brief ROS:  Patient reports no concerns Denies CP/SOB.    Family Communication: family at bedside on rounds     Objective Findings:  Vitals:   09/05/22 0037 09/05/22 0436 09/05/22 0800 09/05/22 1137  BP: (!) 122/51 135/61 125/61 118/60  Pulse: 74 76 82 70  Resp: '20 20 20 20  '$ Temp: 98.6 F (37 C) 98.7 F (37.1 C) 98.8 F (37.1 C) 98.1 F (36.7 C)  TempSrc:   Oral Oral  SpO2: 95% 100% 96% 96%  Weight:        Intake/Output Summary (Last 24 hours) at 09/05/2022 1643 Last data filed at 09/05/2022 0900 Gross per 24 hour   Intake 0 ml  Output 1550 ml  Net -1550 ml   Filed Weights   09/01/22 1951  Weight: 70 kg    Examination:  Physical Exam Constitutional:      General: She is not in acute distress.    Appearance: She is not ill-appearing.  Cardiovascular:     Rate and Rhythm: Normal rate and regular rhythm.  Pulmonary:     Effort: Pulmonary effort is normal.     Breath sounds: Normal breath sounds.  Skin:    General: Skin is warm and dry.  Neurological:     Mental Status: She is alert.  Psychiatric:        Mood and Affect: Mood normal.        Behavior: Behavior normal.          Scheduled Medications:   amLODipine  5 mg Oral Daily   aspirin EC  81 mg Oral Daily   atorvastatin  80 mg Oral Daily   clopidogrel  75 mg Oral Daily   enoxaparin (LOVENOX) injection  40 mg Subcutaneous Q24H   lisinopril  20 mg Oral Daily   metoprolol tartrate  25 mg Oral BID    Continuous Infusions:   PRN Medications:  acetaminophen **OR** acetaminophen (TYLENOL) oral liquid 160 mg/5 mL **OR** acetaminophen, albuterol, influenza vaccine adjuvanted, nitroGLYCERIN  Antimicrobials from admission:  Anti-infectives (From admission, onward)    None           Data Reviewed:  I have personally reviewed the following...  CBC: Recent Labs  Lab 09/01/22 1957 09/03/22 0554 09/04/22 0715 09/05/22 0311  WBC 7.9 10.4 9.2 12.1*  NEUTROABS  --  7.6 6.3 9.0*  HGB 14.3 13.8 12.8 13.0  HCT 43.8 42.8 37.3 39.4  MCV 94.2 94.1 89.4 93.1  PLT 194 174 147* [id number]   Basic Metabolic Panel: Recent Labs  Lab 09/01/22 1957 09/03/22 0554 09/04/22 0715 09/05/22 0311  NA 139 140 137 136  K 4.0 3.5 3.5 3.6  CL 102 106 106 102  CO2 '28 24 25 25  '$ GLUCOSE 120* 114* 91 116*  BUN 25* '23 16 20  '$ CREATININE 0.97 0.78 0.71 0.75  CALCIUM 9.0 8.9 8.4* 9.0   GFR: CrCl cannot be calculated (Unknown ideal weight.). Liver Function Tests: No results for input(s): "AST", "ALT", "ALKPHOS", "BILITOT", "PROT",  "ALBUMIN" in the last 168 hours. No results for input(s): "LIPASE", "AMYLASE" in the last 168 hours. No results for input(s): "AMMONIA" in the last 168 hours. Coagulation Profile: No results for input(s): "INR", "PROTIME" in the last 168 hours. Cardiac Enzymes: No results for input(s): "CKTOTAL", "CKMB", "CKMBINDEX", "TROPONINI" in the last 168 hours. BNP (last 3 results) No results for input(s): "PROBNP" in the last 8760 hours. HbA1C: No results for input(s): "HGBA1C" in the last 72 hours. CBG: Recent Labs  Lab 09/02/22 0158  GLUCAP 108*   Lipid Profile: No results for input(s): "CHOL", "HDL", "LDLCALC", "TRIG", "CHOLHDL", "LDLDIRECT" in the last 72 hours. Thyroid Function Tests: No results for input(s): "TSH", "T4TOTAL", "FREET4", "T3FREE", "THYROIDAB" in the last 72 hours. Anemia Panel: No results for input(s): "VITAMINB12", "FOLATE", "FERRITIN", "TIBC", "  IRON", "RETICCTPCT" in the last 72 hours. Most Recent Urinalysis On File:     Component Value Date/Time   COLORURINE YELLOW (A) 09/01/2022 2059   APPEARANCEUR CLEAR (A) 09/01/2022 2059   APPEARANCEUR Clear 09/22/2012 2145   LABSPEC 1.013 09/01/2022 2059   LABSPEC 1.015 09/22/2012 2145   PHURINE 7.0 09/01/2022 2059   GLUCOSEU NEGATIVE 09/01/2022 2059   GLUCOSEU Negative 09/22/2012 2145   HGBUR SMALL (A) 09/01/2022 2059   BILIRUBINUR NEGATIVE 09/01/2022 2059   BILIRUBINUR Negative 09/22/2012 2145   [place] NEGATIVE 09/01/2022 2059   PROTEINUR 100 (A) 09/01/2022 2059   NITRITE NEGATIVE 09/01/2022 2059   LEUKOCYTESUR NEGATIVE 09/01/2022 2059   LEUKOCYTESUR Trace 09/22/2012 2145   Sepsis Labs: '@LABRCNTIP'$ (procalcitonin:4,lacticidven:4) Microbiology: No results found for this or any previous visit (from the past 240 hour(s)).    Radiology Studies last 3 days: US Carotid Bilateral (at [facility] and [facility] only)  Result Date: 09/03/2022 CLINICAL DATA:  Stroke. EXAM: BILATERAL CAROTID DUPLEX ULTRASOUND TECHNIQUE: Pearline Cables scale  imaging, color Doppler and duplex ultrasound were performed of bilateral carotid and vertebral arteries in the neck. COMPARISON:  None Available. FINDINGS: Criteria: Quantification of carotid stenosis is based on velocity parameters that correlate the residual internal carotid diameter with NASCET-based stenosis levels, using the diameter of the distal internal carotid lumen as the denominator for stenosis measurement. The following velocity measurements were obtained: RIGHT ICA: 112/33 cm/sec CCA: [id number] cm/sec SYSTOLIC ICA/CCA RATIO:  1.6 ECA: 92 cm/sec LEFT ICA: 83/29 cm/sec CCA: [id number] cm/sec SYSTOLIC ICA/CCA RATIO:  1.1 ECA: 131 cm/sec RIGHT CAROTID ARTERY: Small amount of echogenic plaque at the right carotid bulb. Heterogeneous plaque and intimal thickening in the distal common carotid artery. Normal waveforms and velocities in the internal carotid artery. External carotid artery is patent with normal waveform. RIGHT VERTEBRAL ARTERY: Antegrade flow and normal waveform in the right vertebral artery. LEFT CAROTID ARTERY: Intimal thickening and a small amount of heterogeneous plaque in the distal common carotid artery. Small amount of echogenic plaque at the left carotid bulb. External carotid artery is patent with normal waveform. Normal waveforms and velocities in the internal carotid artery. LEFT VERTEBRAL ARTERY: Antegrade flow and normal waveform in the left vertebral artery. IMPRESSION: 1. Atherosclerotic plaque involving bilateral carotid arteries. Estimated degree of stenosis in the internal carotid arteries is less than 50% bilaterally. 2. Patent vertebral arteries with antegrade flow. Electronically Signed   By: Markus Daft M.D.   On: 09/03/2022 07:38   ECHOCARDIOGRAM COMPLETE  Result Date: 09/02/2022    ECHOCARDIOGRAM REPORT   Patient Name:   MALIHA SADO Date of Exam: 09/02/2022 Medical Rec #:  [id number]    Height:       60.0 in Accession #:    [id number]   Weight:       154.3 lb Date of Birth:   16-Jul-1940    BSA:          1.672 m Patient Age:    84 years     BP:           141/61 mmHg Patient Gender: F            HR:           64 bpm. Exam Location:  [facility] Procedure: 2D Echo Indications:     Stroke I63.9  History:         Patient has prior history of Echocardiogram examinations, most  recent 07/15/2018.  Sonographer:     Kathlen Brunswick RDCS Referring Phys:  [id number] Athena Masse Diagnosing Phys: Neoma Laming  Sonographer Comments: Technically difficult study due to poor echo windows. Image acquisition challenging due to patient body habitus. IMPRESSIONS  1. Left ventricular ejection fraction, by estimation, is 60 to 65%. The left ventricle has normal function. The left ventricle has no regional wall motion abnormalities. Left ventricular diastolic parameters are consistent with Grade I diastolic dysfunction (impaired relaxation).  2. Right ventricular systolic function is normal. The right ventricular size is normal.  3. Left atrial size was mild to moderately dilated.  4. Right atrial size was mild to moderately dilated.  5. The mitral valve is normal in structure. Trivial mitral valve regurgitation. No evidence of mitral stenosis.  6. The aortic valve is normal in structure. Aortic valve regurgitation is trivial. Aortic valve sclerosis/calcification is present, without any evidence of aortic stenosis.  7. The inferior vena cava is normal in size with greater than 50% respiratory variability, suggesting right atrial pressure of 3 mmHg. FINDINGS  Left Ventricle: Left ventricular ejection fraction, by estimation, is 60 to 65%. The left ventricle has normal function. The left ventricle has no regional wall motion abnormalities. The left ventricular internal cavity size was normal in size. There is  no left ventricular hypertrophy. Left ventricular diastolic parameters are consistent with Grade I diastolic dysfunction (impaired relaxation). Right Ventricle: The right ventricular size is  normal. No increase in right ventricular wall thickness. Right ventricular systolic function is normal. Left Atrium: Left atrial size was mild to moderately dilated. Right Atrium: Right atrial size was mild to moderately dilated. Pericardium: There is no evidence of pericardial effusion. Mitral Valve: The mitral valve is normal in structure. Trivial mitral valve regurgitation. No evidence of mitral valve stenosis. Tricuspid Valve: The tricuspid valve is normal in structure. Tricuspid valve regurgitation is trivial. No evidence of tricuspid stenosis. Aortic Valve: The aortic valve is normal in structure. Aortic valve regurgitation is trivial. Aortic valve sclerosis/calcification is present, without any evidence of aortic stenosis. Aortic valve peak gradient measures 4.6 mmHg. Pulmonic Valve: The pulmonic valve was normal in structure. Pulmonic valve regurgitation is not visualized. No evidence of pulmonic stenosis. Aorta: The aortic root is normal in size and structure. Venous: The inferior vena cava is normal in size with greater than 50% respiratory variability, suggesting right atrial pressure of 3 mmHg. IAS/Shunts: No atrial level shunt detected by color flow Doppler.  LEFT VENTRICLE PLAX 2D LVIDd:         4.10 cm   Diastology LVIDs:         2.60 cm   LV e' medial:    5.98 cm/s LV PW:         1.40 cm   LV E/e' medial:  13.6 LV IVS:        1.50 cm   LV e' lateral:   6.42 cm/s LVOT diam:     1.80 cm   LV E/e' lateral: 12.6 LV SV:         50 LV SV Index:   30 LVOT Area:     2.54 cm  RIGHT VENTRICLE RV Basal diam:  2.90 cm LEFT ATRIUM           Index        RIGHT ATRIUM           Index LA diam:      3.80 cm 2.27 cm/m   RA Area:  10.30 cm LA Vol (A2C): 31.3 ml 18.72 ml/m  RA Volume:   18.00 ml  10.77 ml/m LA Vol (A4C): 39.3 ml 23.51 ml/m  AORTIC VALVE                 PULMONIC VALVE AV Area (Vmax): 2.05 cm     PV Vmax:       0.93 m/s AV Vmax:        107.00 cm/s  PV Peak grad:  3.5 mmHg AV Peak Grad:   4.6  mmHg LVOT Vmax:      86.20 cm/s LVOT Vmean:     55.600 cm/s LVOT VTI:       0.198 m  AORTA Ao Root diam: 2.90 cm Ao Asc diam:  2.80 cm MITRAL VALVE               TRICUSPID VALVE MV Area (PHT): 2.21 cm    TV Peak grad:   24.4 mmHg MV Decel Time: 344 msec    TV Vmax:        2.47 m/s MV E velocity: 81.20 cm/s MV A velocity: 95.40 cm/s  SHUNTS MV E/A ratio:  0.85        Systemic VTI:  0.20 m                            Systemic Diam: 1.80 cm Neoma Laming Electronically signed by Neoma Laming Signature Date/Time: 09/02/2022/12:39:49 PM    Final    CT HEAD WO CONTRAST (5MM)  Result Date: 09/02/2022 CLINICAL DATA:  Follow-up examination for stroke. EXAM: CT HEAD WITHOUT CONTRAST TECHNIQUE: Contiguous axial images were obtained from the base of the skull through the vertex without intravenous contrast. RADIATION DOSE REDUCTION: This exam was performed according to the departmental dose-optimization program which includes automated exposure control, adjustment of the mA and/or kV according to patient size and/or use of iterative reconstruction technique. COMPARISON:  CT from 09/01/2022. FINDINGS: Brain: Age-related cerebral atrophy with chronic small vessel ischemic disease. Remote lacunar infarct present at the left basal ganglia. Chronic right PCA territory infarct noted. Sequelae of prior left retrosigmoid craniectomy with cranioplasty. Chronic encephalomalacia of within the underlying left cerebral hemisphere. Small remote right cerebellar infarct noted. Suspected small of all vein acute to subacute left thalamic infarct again seen, similar to prior. Possible additional smaller infarct at the contralateral right thalamus noted as well, also relatively similar. No other new large vessel territory infarct. No acute intracranial hemorrhage. No mass lesion or midline shift. Right occipital approach VP shunt catheter in place with tip terminating in the left lateral ventricle. Stable ventricular size and morphology without  hydrocephalus. No extra-axial fluid collection. Vascular: No abnormal hyperdense vessel. Scattered calcified atherosclerosis present at skull base. Skull: Scalp soft tissues demonstrate no acute finding. Post craniectomy changes as above. Sinuses/Orbits: Globes and orbital soft tissues demonstrate no acute finding. Scattered mucosal thickening present about the ethmoidal air cells and maxillary sinuses. No significant mastoid effusion. Other: Nasal prosthesis noted. IMPRESSION: 1. Stable head CT, with suspected acute to subacute left thalamic infarct, an additional possible smaller right thalamic infarct. 2. No other new acute intracranial abnormality. No hemorrhage or significant mass effect. 3. Sequelae of prior left retrosigmoid craniectomy with cranioplasty with chronic encephalomalacia within the underlying left cerebral hemisphere. 4. Right occipital approach VP shunt catheter with tip terminating in the left lateral ventricle. Stable ventricular size and morphology without hydrocephalus. 5. Underlying atrophy with chronic small vessel  ischemic disease, with multiple remote ischemic infarcts as above. Electronically Signed   By: Jeannine Boga M.D.   On: 09/02/2022 03:22   CT Head Wo Contrast  Result Date: 09/01/2022 CLINICAL DATA:  Confusion, speech difficulty x3 days, prior brain tumor EXAM: CT HEAD WITHOUT CONTRAST CT CERVICAL SPINE WITHOUT CONTRAST TECHNIQUE: Multidetector CT imaging of the head and cervical spine was performed following the standard protocol without intravenous contrast. Multiplanar CT image reconstructions of the cervical spine were also generated. RADIATION DOSE REDUCTION: This exam was performed according to the departmental dose-optimization program which includes automated exposure control, adjustment of the mA and/or kV according to patient size and/or use of iterative reconstruction technique. COMPARISON:  10/10/2012 FINDINGS: CT HEAD FINDINGS Brain: No evidence of  hemorrhage,extra-axial collection or mass lesion/mass effect. Hypodensity in the medial left thalamus (series 2/image 14), worrisome for acute/subacute infarct. Additional possible smaller abnormality in the medial right thalamus. Old left basal ganglia lacunar infarct. Right parietal approach ventriculostomy catheter terminating in the frontal horn of the left lateral ventricle. No ventriculomegaly. Subcortical white matter and periventricular small vessel ischemic changes. Vascular: Intracranial atherosclerosis. Skull: Normal. Negative for fracture or focal lesion. Old left suboccipital craniotomy. Sinuses/Orbits: The visualized paranasal sinuses are essentially clear. The mastoid air cells are unopacified. Other: None. CT CERVICAL SPINE FINDINGS Alignment: Normal cervical lordosis. Skull base and vertebrae: No acute fracture. No primary bone lesion or focal pathologic process. Soft tissues and spinal canal: No prevertebral fluid or swelling. No visible canal hematoma. Disc levels: Intervertebral disc spaces are maintained. Spinal canal is patent. Upper chest: Visualized lung apices are clear. Other: None. IMPRESSION: Acute versus subacute medial left thalamic infarct. Equivocal smaller infarct in the medial right thalamus. Right parietal approach ventriculostomy catheter terminating in the frontal horn of the left lateral ventricle. No ventriculomegaly. No traumatic injury to the cervical spine. Electronically Signed   By: Julian Hy M.D.   On: 09/01/2022 21:53   CT Cervical Spine Wo Contrast  Result Date: 09/01/2022 CLINICAL DATA:  Confusion, speech difficulty x3 days, prior brain tumor EXAM: CT HEAD WITHOUT CONTRAST CT CERVICAL SPINE WITHOUT CONTRAST TECHNIQUE: Multidetector CT imaging of the head and cervical spine was performed following the standard protocol without intravenous contrast. Multiplanar CT image reconstructions of the cervical spine were also generated. RADIATION DOSE REDUCTION: This  exam was performed according to the departmental dose-optimization program which includes automated exposure control, adjustment of the mA and/or kV according to patient size and/or use of iterative reconstruction technique. COMPARISON:  10/10/2012 FINDINGS: CT HEAD FINDINGS Brain: No evidence of hemorrhage,extra-axial collection or mass lesion/mass effect. Hypodensity in the medial left thalamus (series 2/image 14), worrisome for acute/subacute infarct. Additional possible smaller abnormality in the medial right thalamus. Old left basal ganglia lacunar infarct. Right parietal approach ventriculostomy catheter terminating in the frontal horn of the left lateral ventricle. No ventriculomegaly. Subcortical white matter and periventricular small vessel ischemic changes. Vascular: Intracranial atherosclerosis. Skull: Normal. Negative for fracture or focal lesion. Old left suboccipital craniotomy. Sinuses/Orbits: The visualized paranasal sinuses are essentially clear. The mastoid air cells are unopacified. Other: None. CT CERVICAL SPINE FINDINGS Alignment: Normal cervical lordosis. Skull base and vertebrae: No acute fracture. No primary bone lesion or focal pathologic process. Soft tissues and spinal canal: No prevertebral fluid or swelling. No visible canal hematoma. Disc levels: Intervertebral disc spaces are maintained. Spinal canal is patent. Upper chest: Visualized lung apices are clear. Other: None. IMPRESSION: Acute versus subacute medial left thalamic infarct. Equivocal smaller infarct in  the medial right thalamus. Right parietal approach ventriculostomy catheter terminating in the frontal horn of the left lateral ventricle. No ventriculomegaly. No traumatic injury to the cervical spine. Electronically Signed   By: Julian Hy M.D.   On: 09/01/2022 21:53             LOS: 4 days      Emeterio Reeve, DO [facility] 09/05/2022, 4:43 PM    Dictation software may have been used to  generate the above note. Typos may occur and escape review in typed/dictated notes. Please contact Dr Sheppard Coil directly for clarity if needed.  Staff may message me via secure chat in [place]  but this may not receive an immediate response,  please page me for urgent matters!  If 7PM-7AM, please contact night coverage [email or web address]

## 2022-09-05 NOTE — Progress Notes (Signed)
Physical Therapy Treatment Patient Details Name: [name] MRN: [id number] DOB: Sep 11, 1940 Today's Date: 09/05/2022   History of Present Illness Pt is a 82 y.o. female presenting to hospital 09/01/22 with confusion, speaking nonsensically, unsteady gait starting 3 days ago following fall.  Recent falls and difficulty speaking; chronic difficulty with R hand (related to tumor)  Imaging showing suspected acute to subacute L thalamic infarct and an additional possible smaller R thalamic infarct; also sequelae of prior L retrosigmoid craniectomy with cranioplasty and R occipital approach VP shunt catheter.  Pt admitted with acute thalamic infarction and hypertensive emergency.  PMH includes htn, HLD, CAD, diastolic CHF, asthma, brain tumor (acoustic neuroma) with resection about 7 years ago at [facility], h/o STEMI 2013 s/p stent to RCA, hydrocephalus s/p VP shunt.    PT Comments    Patient received in bed, eyes closed, minimal verbalizations. Daughter present.  Video interpretation used for session, Wyatt Leeming [phone]. Patient requires constant multimodal cues mobility and to keep eyes open. She reports dizziness with walking. Requires +2 min/mod assist for bed mobility, transfers and ambulation in room. Patient will continue to benefit from skilled PT to improve functional independence and safety with mobility.      Recommendations for follow up therapy are one component of a multi-disciplinary discharge planning process, led by the attending physician.  Recommendations may be updated based on patient status, additional functional criteria and insurance authorization.  Follow Up Recommendations  Acute inpatient rehab (3hours/day) Can patient physically be transported by private vehicle: No   Assistance Recommended at Discharge Frequent or constant Supervision/Assistance  Patient can return home with the following Two people to help with walking and/or transfers;A lot of help with  bathing/dressing/bathroom;Assistance with cooking/housework;Direct supervision/assist for medications management;Direct supervision/assist for financial management;Assist for transportation;Help with stairs or ramp for entrance   Equipment Recommendations  Other (comment) (TBD next venue)    Recommendations for Other Services Rehab consult     Precautions / Restrictions Precautions Precautions: Fall Restrictions Weight Bearing Restrictions: No     Mobility  Bed Mobility Overal bed mobility: Needs Assistance Bed Mobility: Supine to Sit, Sit to Supine     Supine to sit: Mod assist, +2 for physical assistance Sit to supine: Mod assist, +2 for physical assistance        Transfers Overall transfer level: Needs assistance Equipment used: 2 person hand held assist Transfers: Sit to/from Stand Sit to Stand: Mod assist, +2 physical assistance   Step pivot transfers: Min assist, Mod assist, +2 physical assistance       General transfer comment: visual and verbal cues needed to perform tasks    Ambulation/Gait Ambulation/Gait assistance: Min assist, Mod assist, +2 physical assistance Gait Distance (Feet): 15 Feet Assistive device: 2 person hand held assist Gait Pattern/deviations: Step-to pattern, Decreased step length - right, Decreased step length - left, Decreased stride length, Shuffle Gait velocity: decreased     General Gait Details: short shuffling gait; required seated rest during gait due to fatigue and dizziness.   Stairs             Wheelchair Mobility    Modified Rankin (Stroke Patients Only)       Balance Overall balance assessment: Needs assistance Sitting-balance support: Feet supported Sitting balance-Leahy Scale: Fair Sitting balance - Comments: steady static sitting edge of bed   Standing balance support: Bilateral upper extremity supported, During functional activity Standing balance-Leahy Scale: Poor Standing balance comment: min A +2  for standing balance  Cognition Arousal/Alertness: Awake/alert, Lethargic Behavior During Therapy: Flat affect Overall Cognitive Status: Impaired/Different from baseline Area of Impairment: Orientation, Attention, Memory, Following commands, Safety/judgement, Awareness, Problem solving                 Orientation Level: Disoriented to, Time, Situation Current Attention Level: Focused   Following Commands: Follows one step commands inconsistently Safety/Judgement: Decreased awareness of safety, Decreased awareness of deficits Awareness: Intellectual Problem Solving: Slow processing, Decreased initiation, Difficulty sequencing, Requires verbal cues, Requires tactile cues General Comments: Requires mod cues for direction, decreased attention to right side. Keeps eyes shut tight for most of session.        Exercises      General Comments        Pertinent Vitals/Pain Pain Assessment Pain Assessment: PAINAD Breathing: normal Negative Vocalization: none Facial Expression: smiling or inexpressive Body Language: relaxed Consolability: no need to console PAINAD Score: 0    Home Living                          Prior Function            PT Goals (current goals can now be found in the care plan section) Acute Rehab PT Goals Patient Stated Goal: to improve mobility PT Goal Formulation: With family Time For Goal Achievement: 09/17/22 Potential to Achieve Goals: Fair Progress towards PT goals: Progressing toward goals    Frequency    7X/week      PT Plan Discharge plan needs to be updated    Co-evaluation PT/OT/SLP Co-Evaluation/Treatment: Yes Reason for Co-Treatment: To address functional/ADL transfers;For patient/therapist safety;Complexity of the patient's impairments (multi-system involvement) PT goals addressed during session: Mobility/safety with mobility;Balance;Proper use of DME        AM-PAC PT "6  Clicks" Mobility   Outcome Measure  Help needed turning from your back to your side while in a flat bed without using bedrails?: A Lot Help needed moving from lying on your back to sitting on the side of a flat bed without using bedrails?: A Lot Help needed moving to and from a bed to a chair (including a wheelchair)?: A Lot Help needed standing up from a chair using your arms (e.g., wheelchair or bedside chair)?: A Lot Help needed to walk in hospital room?: A Lot Help needed climbing 3-5 steps with a railing? : Total 6 Click Score: 11    End of Session Equipment Utilized During Treatment: Gait belt Activity Tolerance: Patient limited by fatigue Patient left: in bed;with bed alarm set;with family/visitor present Nurse Communication: Mobility status PT Visit Diagnosis: Other abnormalities of gait and mobility (R26.89);Muscle weakness (generalized) (M62.81);History of falling (Z91.81);Other symptoms and signs involving the nervous system (R29.898);Difficulty in walking, not elsewhere classified (R26.2);Unsteadiness on feet (R26.81)     Time: [id number] PT Time Calculation (min) (ACUTE ONLY): 23 min  Charges:  $Gait Training: 8-22 mins                     Kshawn Canal, PT, GCS 09/05/22,11:49 AM

## 2022-09-05 NOTE — Progress Notes (Signed)
Inpatient Rehab Admissions Coordinator:   Per updated PT recommendations pt was screened for CIR by Shann Medal, PT, DPT.  At this time, pt appears to be a potential candidate for CIR and I will place an order for consult per our protocol.  Shann Medal, PT, DPT Admissions Coordinator [phone] 09/05/22  12:15 PM

## 2022-09-05 NOTE — Progress Notes (Signed)
Occupational Therapy Treatment Patient Details Name: [name] MRN: [id number] DOB: Aug 25, 1940 Today's Date: 09/05/2022   History of present illness Pt is a 82 y.o. female presenting to hospital 09/01/22 with confusion, speaking nonsensically, unsteady gait starting 3 days ago following fall.  Recent falls and difficulty speaking; chronic difficulty with R hand (related to tumor)  Imaging showing suspected acute to subacute L thalamic infarct and an additional possible smaller R thalamic infarct; also sequelae of prior L retrosigmoid craniectomy with cranioplasty and R occipital approach VP shunt catheter.  Pt admitted with acute thalamic infarction and hypertensive emergency.  PMH includes htn, HLD, CAD, diastolic CHF, asthma, brain tumor (acoustic neuroma) with resection about 7 years ago at [facility], h/o STEMI 2013 s/p stent to RCA, hydrocephalus s/p VP shunt.   OT comments  Upon entering the room, pt supine in bed with her daughter present in room. Pt seen for skilled treatment with PT. Pt initially very lethargic with washcloth placed on pt's face to increase alertness. Use of stratus video interpreter during session. Pt needing hand over hand assistance to initiate using cloth to wash face and then pull off with increased time and max multimodal cuing. Mod A of 2 to position pt at EOB with her able to maintain static sitting balance with close supervision. Pt stands with mod A of 2 and takes several steps to [facility] for transfer. Pt takes seated rest break and then continues ambulating in same manner around bed. Attempts to use RW but that confuses pt further so it was removed once more. Pt squeezing eyes shut and needing to abruptly sit during session. Unsure if BP related or vision deficits as pt was unable to follow any formal visual assessment during session. Pt making progress and recommendation changed to intensive inpatient rehab to address functional deficits before returning home.     Recommendations for follow up therapy are one component of a multi-disciplinary discharge planning process, led by the attending physician.  Recommendations may be updated based on patient status, additional functional criteria and insurance authorization.    Follow Up Recommendations  Acute inpatient rehab (3hours/day)     Assistance Recommended at Discharge Frequent or constant Supervision/Assistance  Patient can return home with the following  Two people to help with bathing/dressing/bathroom;Two people to help with walking and/or transfers;Help with stairs or ramp for entrance;Assist for transportation;Assistance with cooking/housework   Equipment Recommendations  Other (comment) (defer to next venue of care)       Precautions / Restrictions Precautions Precautions: Fall Restrictions Weight Bearing Restrictions: No       Mobility Bed Mobility Overal bed mobility: Needs Assistance Bed Mobility: Supine to Sit, Sit to Supine     Supine to sit: Mod assist, +2 for physical assistance Sit to supine: Mod assist, +2 for physical assistance        Transfers Overall transfer level: Needs assistance Equipment used: 2 person hand held assist Transfers: Sit to/from Stand Sit to Stand: Mod assist, +2 physical assistance     Step pivot transfers: Min assist, Mod assist, +2 physical assistance     General transfer comment: visual and verbal cues needed to perform tasks     Balance Overall balance assessment: Needs assistance Sitting-balance support: Feet supported Sitting balance-Leahy Scale: Fair Sitting balance - Comments: steady static sitting edge of bed   Standing balance support: Bilateral upper extremity supported, During functional activity Standing balance-Leahy Scale: Poor Standing balance comment: min A +2 for standing balance  ADL either performed or assessed with clinical judgement   ADL Overall ADL's : Needs  assistance/impaired                         Toilet Transfer: +2 for physical assistance;Moderate assistance;Stand-pivot;Minimal assistance;BSC/3in1                  Extremity/Trunk Assessment Upper Extremity Assessment Upper Extremity Assessment: Difficult to assess due to impaired cognition   Lower Extremity Assessment Lower Extremity Assessment: Difficult to assess due to impaired cognition        Vision   Vision Assessment?: Vision impaired- to be further tested in functional context Additional Comments: umsure as pt unable to follow formal visual assessment. She is displaying R inattention during session. She closes eyes often and reports dizziness. Unsure if she has nystagmus since she will not open eyes on command for therapist to assess further.          Cognition Arousal/Alertness: Awake/alert, Lethargic Behavior During Therapy: Flat affect Overall Cognitive Status: Impaired/Different from baseline Area of Impairment: Orientation, Attention, Memory, Following commands, Safety/judgement, Awareness, Problem solving                 Orientation Level: Disoriented to, Time, Situation Current Attention Level: Focused Memory: Decreased recall of precautions Following Commands: Follows one step commands inconsistently Safety/Judgement: Decreased awareness of safety, Decreased awareness of deficits Awareness: Intellectual Problem Solving: Slow processing, Decreased initiation, Difficulty sequencing, Requires verbal cues, Requires tactile cues General Comments: Requires mod cues for direction, decreased attention to right side.                   Pertinent Vitals/ Pain       Pain Assessment Pain Assessment: Faces Faces Pain Scale: Hurts a little bit Pain Location: generalized Pain Descriptors / Indicators: Discomfort Pain Intervention(s): Monitored during session, Repositioned         Frequency  Min 3X/week        Progress Toward  Goals  OT Goals(current goals can now be found in the care plan section)  Progress towards OT goals: Progressing toward goals     Plan Discharge plan needs to be updated;Frequency needs to be updated    Co-evaluation    PT/OT/SLP Co-Evaluation/Treatment: Yes Reason for Co-Treatment: To address functional/ADL transfers;For patient/therapist safety;Complexity of the patient's impairments (multi-system involvement) PT goals addressed during session: Mobility/safety with mobility;Balance;Proper use of DME OT goals addressed during session: ADL's and self-care      AM-PAC OT "6 Clicks" Daily Activity     Outcome Measure   Help from another person eating meals?: A Lot Help from another person taking care of personal grooming?: A Lot Help from another person toileting, which includes using toliet, bedpan, or urinal?: Total Help from another person bathing (including washing, rinsing, drying)?: Total Help from another person to put on and taking off regular upper body clothing?: A Lot Help from another person to put on and taking off regular lower body clothing?: Total 6 Click Score: 9    End of Session Equipment Utilized During Treatment: Gait belt  OT Visit Diagnosis: Unsteadiness on feet (R26.81);Muscle weakness (generalized) (M62.81);Repeated falls (R29.6);Hemiplegia and hemiparesis Hemiplegia - Right/Left: Right Hemiplegia - dominant/non-dominant: Dominant Hemiplegia - caused by: Cerebral infarction   Activity Tolerance Patient limited by fatigue   Patient Left in chair;with chair alarm set;with call bell/phone within reach;with family/visitor present   Nurse Communication Mobility status  Time: [id number] OT Time Calculation (min): 25 min  Charges: OT General Charges $OT Visit: 1 Visit OT Treatments $Self Care/Home Management : 8-22 mins  Darleen Crocker, MS, OTR/L , CBIS ascom [phone]  09/05/22, 1:22 PM

## 2022-09-05 NOTE — Progress Notes (Addendum)
  Inpatient Rehabilitation Admissions Coordinator   Met with patient  at bedside for rehab assessment. I contacted pt's son, Vergia Alberts, by phone prior to arrival to request family presence at 4 pm today for rehab assessment. No family present.I will follow up with family to discuss rehab venue options and preference. Please call me with any questions.   Danne Baxter, RN, MSN Rehab Admissions Coordinator [phone]

## 2022-09-05 NOTE — Hospital Course (Addendum)
[name] is a 82 y.o. 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  EMS recorded a systolic blood pressure of 220. 03/02: BP as high as 190/84 with otherwise normal vitals.  Labs unremarkable.  EKG, personally viewed and interpreted showing sinus at 78 with no acute ST-T wave changes.  CT head showing acute to subacute thalamic infarct. Stroke likely happened 3 days prior given onset of symptoms followed in fall 3 days prior to presentation. Admitted to hospitalist service late evening 03/03: neurology saw patient - Continue ASA/Plavix, permissive HTN and stroke work-up. Echo done. SLP saw patient and cleared for diet.  03/04: PT/OT recs for SNF. 03/05 - 03/06: SNF placement pending. 03/06 eval for CIR bu family would like to stay local. 03/07-03/11: pend SNF     Consultants:  Neurology   Procedures: none      ASSESSMENT & PLAN:   Principal Problem:   Acute thalamic infarction ([place]) Active Problems:   CAD S/P percutaneous coronary angioplasty   Hypertensive emergency   Asthma   Chronic diastolic CHF (congestive heart failure) ([place])   S/P excision of acoustic neuroma 2014   VP (ventriculoperitoneal) shunt status for hydrocephalus  Acute thalamic infarction ([place]) CT head showing acute to subacute thalamic infarct.  Echocardiogram showed ejection fraction of 60 to 65%  Carotid ultrasound showed atherosclerotic plaque involving bilateral carotid with estimated stenosis less than 50% bilaterally.Blood pressure control  Continue home atorvastatin 80  Continue ASA '81mg'$  daily, Plavix '75mg'$  daily x 3 weeks then monotherapy thereafter (patient got full dose aspirin in the ED) PT OT have recommended  skilled nursing facility placement      VP (ventriculoperitoneal) shunt status for hydrocephalus Ventriculostomy catheter in correct placement per CT.   No ventriculomegaly   S/P excision of acoustic neuroma 2014 No acute issues suspected based on head CT   Chronic diastolic CHF (congestive heart failure) ([facility]) Clinically euvolemic Continue metoprolol Dose of lisinopril have been adjusted upward for adequate blood pressure control   Asthma Not acutely exacerbated Albuterol as needed   Hypertensive emergency We allowed permissive hypertension for the first 24 hours Blood pressure medications being continued at this time   CAD S/P percutaneous coronary angioplasty History of STEMI 2013 s/p bare-metal stent to RCA No complaints of chest pain, EKG nonacute Continue atorvastatin, clopidogrel, metoprolol and nitroglycerin as needed chest pain     DVT prophylaxis: lovenox Pertinent IV fluids/nutrition: po diet no IV fluids  Central lines / invasive devices: none  Code Status: FULL CODE  Current Admission Status: inpatient   TOC needs / Dispo plan: placement Barriers to discharge / significant pending items: SNF placement

## 2022-09-06 DIAGNOSIS — I6381 Other cerebral infarction due to occlusion or stenosis of small artery: Secondary | ICD-10-CM | POA: Diagnosis not present

## 2022-09-06 NOTE — Progress Notes (Signed)
Inpatient Rehabilitation Admissions Coordinator   I received call form son, Vergia Alberts . He wants patient to remain in the [place] area for her rehab, not [facility] in [place]. I have alerted acute team and TOC. We will sign off at this time.  Danne Baxter, RN, MSN Rehab Admissions Coordinator [phone] 09/06/2022 11:22 AM

## 2022-09-06 NOTE — TOC Progression Note (Signed)
Transition of Care [facility]) - Progression Note    Patient Details  Name: [name] MRN: [id number] Date of Birth: Aug 10, 1940  Transition of Care [facility]) CM/SW Contact  Laurena Slimmer, RN Phone Number: 09/06/2022, 11:20 AM  Clinical Narrative:    Spoke with patient daughter at bedside.  She was updated about discharge plan for SNF vs CIR. She is agreeable. She stated patient lives with her brother. Advised RNCM would follow up after therapy evaluation today.         Expected Discharge Plan and Services                                               Social Determinants of Health (SDOH) Interventions SDOH Screenings   Tobacco Use: Low Risk  (09/01/2022)    Readmission Risk Interventions     No data to display

## 2022-09-06 NOTE — Progress Notes (Signed)
PROGRESS NOTE    [name]   [id number] DOB: 26-Apr-1941  DOA: 09/01/2022 Date of Service: 09/06/22 PCP: Albina Billet, MD     Brief Narrative / Hospital Course:  [name] is a 82 y.o. 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  EMS recorded a systolic blood pressure of 220. 03/02: BP as high as 190/84 with otherwise normal vitals.  Labs unremarkable.  EKG, personally viewed and interpreted showing sinus at 78 with no acute ST-T wave changes.  CT head showing acute to subacute thalamic infarct. Stroke likely happened 3 days prior given onset of symptoms followed in fall 3 days prior to presentation. Admitted to hospitalist service late evening 03/03: neurology saw patient - Continue ASA/Plavix, permissive HTN and stroke work-up. Echo done. SLP saw patient and cleared for diet.  03/04: PT/OT recs for SNF. 03/05 - 03/06: SNF placement pending. 03/06 eval for CIR bu family would like to stay local. 03/07: pend SNF     Consultants:  Neurology   Procedures: none      ASSESSMENT & PLAN:   Principal Problem:   Acute thalamic infarction ([place]) Active Problems:   CAD S/P percutaneous coronary angioplasty   Hypertensive emergency   Asthma   Chronic diastolic CHF (congestive heart failure) ([place])   S/P excision of acoustic neuroma 2014   VP (ventriculoperitoneal) shunt status for hydrocephalus  Acute thalamic infarction ([place]) CT head showing acute to subacute thalamic infarct.  Echocardiogram showed ejection fraction of 60 to 65%  Carotid ultrasound showed atherosclerotic plaque involving bilateral carotid with estimated stenosis less than 50% bilaterally.Blood pressure control  Continue  home atorvastatin 80  Continue ASA '81mg'$  daily, Plavix '75mg'$  daily x 3 weeks then monotherapy thereafter (patient got full dose aspirin in the ED) PT OT have recommended skilled nursing facility placement      VP (ventriculoperitoneal) shunt status for hydrocephalus Ventriculostomy catheter in correct placement per CT.   No ventriculomegaly   S/P excision of acoustic neuroma 2014 No acute issues suspected based on head CT   Chronic diastolic CHF (congestive heart failure) ([facility]) Clinically euvolemic Continue metoprolol Dose of lisinopril have been adjusted upward for adequate blood pressure control   Asthma Not acutely exacerbated Albuterol as needed   Hypertensive emergency We allowed permissive hypertension for the first 24 hours Blood pressure medications being continued at this time   CAD S/P percutaneous coronary angioplasty History of STEMI 2013 s/p bare-metal stent to RCA No complaints of chest pain, EKG nonacute Continue atorvastatin, clopidogrel, metoprolol and nitroglycerin as needed chest pain     DVT prophylaxis: lovenox Pertinent IV fluids/nutrition: po diet no IV fluids  Central lines / invasive devices: none  Code Status: FULL CODE  Current Admission Status: inpatient   TOC needs / Dispo plan: placement Barriers to discharge / significant pending items: SNF placement             Subjective / Brief ROS:  Patient reports no concerns Denies CP/SOB.    Family Communication: family at bedside on rounds     Objective Findings:  Vitals:   09/06/22 0336 09/06/22 0837 09/06/22 1225 09/06/22 1610  BP: (!) 133/58 (!) 125/56 131/64 124/61  Pulse: 66 69 71 65  Resp: 18 20  18 20  Temp: 99.2 F (37.3 C) 98.2 F (36.8 C) 98.8 F (37.1 C) 98 F (36.7 C)  TempSrc:   Axillary   SpO2: 96% 96% 95% 96%  Weight:        Intake/Output Summary (Last 24 hours) at 09/06/2022 1709 Last data filed at 09/06/2022 [id number] Gross per 24 hour  Intake 100 ml   Output 550 ml  Net -450 ml   Filed Weights   09/01/22 1951  Weight: 70 kg    Examination:  Physical Exam Constitutional:      General: She is not in acute distress.    Appearance: She is not ill-appearing.  Cardiovascular:     Rate and Rhythm: Normal rate and regular rhythm.  Pulmonary:     Effort: Pulmonary effort is normal.     Breath sounds: Normal breath sounds.  Skin:    General: Skin is warm and dry.  Neurological:     Mental Status: She is alert.  Psychiatric:        Mood and Affect: Mood normal.        Behavior: Behavior normal.          Scheduled Medications:   amLODipine  5 mg Oral Daily   aspirin EC  81 mg Oral Daily   atorvastatin  80 mg Oral Daily   clopidogrel  75 mg Oral Daily   enoxaparin (LOVENOX) injection  40 mg Subcutaneous Q24H   lisinopril  20 mg Oral Daily   metoprolol tartrate  25 mg Oral BID    Continuous Infusions:   PRN Medications:  acetaminophen **OR** acetaminophen (TYLENOL) oral liquid 160 mg/5 mL **OR** acetaminophen, albuterol, influenza vaccine adjuvanted, nitroGLYCERIN  Antimicrobials from admission:  Anti-infectives (From admission, onward)    None           Data Reviewed:  I have personally reviewed the following...  CBC: Recent Labs  Lab 09/01/22 1957 09/03/22 0554 09/04/22 0715 09/05/22 0311  WBC 7.9 10.4 9.2 12.1*  NEUTROABS  --  7.6 6.3 9.0*  HGB 14.3 13.8 12.8 13.0  HCT 43.8 42.8 37.3 39.4  MCV 94.2 94.1 89.4 93.1  PLT 194 174 147* [id number]   Basic Metabolic Panel: Recent Labs  Lab 09/01/22 1957 09/03/22 0554 09/04/22 0715 09/05/22 0311  NA 139 140 137 136  K 4.0 3.5 3.5 3.6  CL 102 106 106 102  CO2 '28 24 25 25  '$ GLUCOSE 120* 114* 91 116*  BUN 25* '23 16 20  '$ CREATININE 0.97 0.78 0.71 0.75  CALCIUM 9.0 8.9 8.4* 9.0   GFR: CrCl cannot be calculated (Unknown ideal weight.). Liver Function Tests: No results for input(s): "AST", "ALT", "ALKPHOS", "BILITOT", "PROT", "ALBUMIN" in the last  168 hours. No results for input(s): "LIPASE", "AMYLASE" in the last 168 hours. No results for input(s): "AMMONIA" in the last 168 hours. Coagulation Profile: No results for input(s): "INR", "PROTIME" in the last 168 hours. Cardiac Enzymes: No results for input(s): "CKTOTAL", "CKMB", "CKMBINDEX", "TROPONINI" in the last 168 hours. BNP (last 3 results) No results for input(s): "PROBNP" in the last 8760 hours. HbA1C: No results for input(s): "HGBA1C" in the last 72 hours. CBG: Recent Labs  Lab 09/02/22 0158  GLUCAP 108*   Lipid Profile: No results for input(s): "CHOL", "HDL", "LDLCALC", "TRIG", "CHOLHDL", "LDLDIRECT" in the last 72 hours. Thyroid Function Tests: No results for input(s): "TSH", "T4TOTAL", "FREET4", "T3FREE", "THYROIDAB" in the last 72 hours. Anemia Panel: No results for input(s): "VITAMINB12", "FOLATE", "FERRITIN", "TIBC", "IRON", "RETICCTPCT" in  the last 72 hours. Most Recent Urinalysis On File:     Component Value Date/Time   COLORURINE YELLOW (A) 09/01/2022 2059   APPEARANCEUR CLEAR (A) 09/01/2022 2059   APPEARANCEUR Clear 09/22/2012 2145   LABSPEC 1.013 09/01/2022 2059   LABSPEC 1.015 09/22/2012 2145   PHURINE 7.0 09/01/2022 2059   GLUCOSEU NEGATIVE 09/01/2022 2059   GLUCOSEU Negative 09/22/2012 2145   HGBUR SMALL (A) 09/01/2022 2059   BILIRUBINUR NEGATIVE 09/01/2022 2059   BILIRUBINUR Negative 09/22/2012 2145   [place] NEGATIVE 09/01/2022 2059   PROTEINUR 100 (A) 09/01/2022 2059   NITRITE NEGATIVE 09/01/2022 2059   LEUKOCYTESUR NEGATIVE 09/01/2022 2059   LEUKOCYTESUR Trace 09/22/2012 2145   Sepsis Labs: '@LABRCNTIP'$ (procalcitonin:4,lacticidven:4) Microbiology: No results found for this or any previous visit (from the past 240 hour(s)).    Radiology Studies last 3 days: No results found.           LOS: 5 days      Emeterio Reeve, DO [facility] 09/06/2022, 5:09 PM    Dictation software may have been used to generate the  above note. Typos may occur and escape review in typed/dictated notes. Please contact Dr Sheppard Coil directly for clarity if needed.  Staff may message me via secure chat in [place]  but this may not receive an immediate response,  please page me for urgent matters!  If 7PM-7AM, please contact night coverage [email or web address]

## 2022-09-06 NOTE — Plan of Care (Signed)

## 2022-09-06 NOTE — Progress Notes (Signed)
Occupational Therapy Treatment Patient Details Name: [name] MRN: [id number] DOB: 1940/08/20 Today's Date: 09/06/2022   History of present illness Pt is a 82 y.o. female presenting to hospital 09/01/22 with confusion, speaking nonsensically, unsteady gait starting 3 days ago following fall.  Recent falls and difficulty speaking; chronic difficulty with R hand (related to tumor)  Imaging showing suspected acute to subacute L thalamic infarct and an additional possible smaller R thalamic infarct; also sequelae of prior L retrosigmoid craniectomy with cranioplasty and R occipital approach VP shunt catheter.  Pt admitted with acute thalamic infarction and hypertensive emergency.  PMH includes htn, HLD, CAD, diastolic CHF, asthma, brain tumor (acoustic neuroma) with resection about 7 years ago at [facility], h/o STEMI 2013 s/p stent to RCA, hydrocephalus s/p VP shunt.   OT comments  Pt seen for skilled co-treatment with PT. Upon entering the room, pt supine in bed and sleeping soundly with family member present in room. Use of video interpreter for pt and family's performed language. Pt needing sternal rub to wake for therapeutic intervention. Pt needing mod A of 2  for functional mobility this session. Pt ambulates with 2 person HHA as she visibility reaches out to hold therapist hand. Pt standing at sink and again begins to close eyes but does not report how she is feeling. OT presenting all self care items in R visual field and pt able to locate with increased time and forced use of R UE. Pt needing hand over hand assistance to bring toothbrush to mouth to initiate and then pt able to brush on her own. Increased time with mod -max multimodal cuing for sequencing and initiation during session. Pt returns to sit in recliner chair at end of session with chair alarm activated and family member remaining in room. OT did attempt formal visual assessment further but pt remains unable to fully participate. She  does close eyes as soon as she looks to R during assessment. She may have diplopia but will need to be tested further. Pt does not appear to have nystagmus with visual tracking during observation.    Recommendations for follow up therapy are one component of a multi-disciplinary discharge planning process, led by the attending physician.  Recommendations may be updated based on patient status, additional functional criteria and insurance authorization.    Follow Up Recommendations  Skilled nursing-short term rehab (<3 hours/day)     Assistance Recommended at Discharge Frequent or constant Supervision/Assistance  Patient can return home with the following  Two people to help with bathing/dressing/bathroom;Two people to help with walking and/or transfers;Help with stairs or ramp for entrance;Assist for transportation;Assistance with cooking/housework   Equipment Recommendations  Other (comment) (defer to next venue of care)       Precautions / Restrictions Precautions Precautions: Fall Restrictions Weight Bearing Restrictions: No       Mobility Bed Mobility Overal bed mobility: Needs Assistance Bed Mobility: Supine to Sit     Supine to sit: Mod assist, +2 for physical assistance, HOB elevated     General bed mobility comments: poor initiation, despite reporting that she understood what was asked of her. Keeps eyes closed for a good part of session    Transfers Overall transfer level: Needs assistance Equipment used: 2 person hand held assist Transfers: Sit to/from Stand Sit to Stand: Min assist, +2 physical assistance     Step pivot transfers: Min assist, Mod assist, +2 physical assistance     General transfer comment: visual and verbal cues needed  to perform tasks     Balance Overall balance assessment: Needs assistance Sitting-balance support: Feet supported Sitting balance-Leahy Scale: Good Sitting balance - Comments: steady static sitting edge of bed   Standing  balance support: Bilateral upper extremity supported, During functional activity Standing balance-Leahy Scale: Fair                             ADL either performed or assessed with clinical judgement   ADL Overall ADL's : Needs assistance/impaired     Grooming: Wash/dry hands;Oral care;Minimal assistance;Cueing for sequencing;Cueing for safety;Standing                                        Cognition Arousal/Alertness: Lethargic, Awake/alert Behavior During Therapy: Flat affect Overall Cognitive Status: Impaired/Different from baseline Area of Impairment: Orientation, Following commands, Safety/judgement, Awareness, Problem solving                 Orientation Level: Disoriented to, Place, Time, Situation Current Attention Level: Focused   Following Commands: Follows one step commands inconsistently, Follows one step commands with increased time Safety/Judgement: Decreased awareness of safety, Decreased awareness of deficits Awareness: Intellectual Problem Solving: Slow processing, Decreased initiation, Difficulty sequencing, Requires verbal cues, Requires tactile cues General Comments: Requires mod cues for direction, decreased attention to right side.                   Pertinent Vitals/ Pain       Pain Assessment Pain Assessment: PAINAD Breathing: normal Negative Vocalization: none Facial Expression: sad, frightened, frown Body Language: relaxed Consolability: no need to console PAINAD Score: 1         Frequency  Min 3X/week        Progress Toward Goals  OT Goals(current goals can now be found in the care plan section)  Progress towards OT goals: Progressing toward goals     Plan Discharge plan needs to be updated;Frequency remains appropriate    Co-evaluation      Reason for Co-Treatment: Necessary to address cognition/behavior during functional activity;For patient/therapist safety;To address functional/ADL  transfers PT goals addressed during session: Mobility/safety with mobility;Balance OT goals addressed during session: ADL's and self-care      AM-PAC OT "6 Clicks" Daily Activity     Outcome Measure   Help from another person eating meals?: A Lot Help from another person taking care of personal grooming?: A Lot Help from another person toileting, which includes using toliet, bedpan, or urinal?: Total Help from another person bathing (including washing, rinsing, drying)?: Total Help from another person to put on and taking off regular upper body clothing?: A Lot Help from another person to put on and taking off regular lower body clothing?: Total 6 Click Score: 9    End of Session    OT Visit Diagnosis: Unsteadiness on feet (R26.81);Muscle weakness (generalized) (M62.81);Repeated falls (R29.6);Hemiplegia and hemiparesis Hemiplegia - dominant/non-dominant: Dominant Hemiplegia - caused by: Cerebral infarction   Activity Tolerance Patient tolerated treatment well   Patient Left in chair;with chair alarm set;with call bell/phone within reach;with family/visitor present   Nurse Communication Mobility status        Time: [id number] OT Time Calculation (min): 29 min  Charges: OT General Charges $OT Visit: 1 Visit OT Treatments $Self Care/Home Management : 8-22 mins  Darleen Crocker, MS, OTR/L , CBIS ascom [phone]  09/06/22,  2:06 PM

## 2022-09-06 NOTE — Progress Notes (Signed)
Physical Therapy Treatment Patient Details Name: [name] MRN: [id number] DOB: 1941/07/02 Today's Date: 09/06/2022   History of Present Illness Pt is a 82 y.o. female presenting to hospital 09/01/22 with confusion, speaking nonsensically, unsteady gait starting 3 days ago following fall.  Recent falls and difficulty speaking; chronic difficulty with R hand (related to tumor)  Imaging showing suspected acute to subacute L thalamic infarct and an additional possible smaller R thalamic infarct; also sequelae of prior L retrosigmoid craniectomy with cranioplasty and R occipital approach VP shunt catheter.  Pt admitted with acute thalamic infarction and hypertensive emergency.  PMH includes htn, HLD, CAD, diastolic CHF, asthma, brain tumor (acoustic neuroma) with resection about 7 years ago at [facility], h/o STEMI 2013 s/p stent to RCA, hydrocephalus s/p VP shunt.    PT Comments    Patient received resting in bed, required increased time to arouse. Patient keeps eyes shut tight for a lot of the session. Patient required mod/max+2 for supine to sit. Good sitting balance once there. She is able to stand with min +2 hand held assist and ambulated 20 feet with +2 min A. She is making progress with mobility but continues to have processing delays and difficulty following instruction at times. Patient will continue to benefit from skilled PT to improve independence and safety.    Recommendations for follow up therapy are one component of a multi-disciplinary discharge planning process, led by the attending physician.  Recommendations may be updated based on patient status, additional functional criteria and insurance authorization.  Follow Up Recommendations  Skilled nursing-short term rehab (<3 hours/day) Can patient physically be transported by private vehicle: No   Assistance Recommended at Discharge Frequent or constant Supervision/Assistance  Patient can return home with the following A lot of help  with bathing/dressing/bathroom;Two people to help with walking and/or transfers;Assist for transportation;Help with stairs or ramp for entrance;Assistance with cooking/housework;Direct supervision/assist for medications management;Assistance with feeding   Equipment Recommendations  None recommended by PT;Other (comment) (to be determined)    Recommendations for Other Services       Precautions / Restrictions Precautions Precautions: Fall Restrictions Weight Bearing Restrictions: No     Mobility  Bed Mobility Overal bed mobility: Needs Assistance Bed Mobility: Supine to Sit     Supine to sit: Mod assist, +2 for physical assistance, HOB elevated     General bed mobility comments: poor initiation, despite reporting that she understood what was asked of her. Keeps eyes closed for a good part of session    Transfers Overall transfer level: Needs assistance Equipment used: 2 person hand held assist Transfers: Sit to/from Stand Sit to Stand: Min assist, +2 physical assistance                Ambulation/Gait Ambulation/Gait assistance: Min assist, +2 physical assistance Gait Distance (Feet): 20 Feet Assistive device: 2 person hand held assist Gait Pattern/deviations: Step-to pattern, Decreased step length - right, Decreased step length - left, Shuffle Gait velocity: decreased     General Gait Details: short, shuffle steps. Reliant on B UE support   Stairs             Wheelchair Mobility    Modified Rankin (Stroke Patients Only)       Balance Overall balance assessment: Needs assistance Sitting-balance support: Feet supported Sitting balance-Leahy Scale: Good Sitting balance - Comments: steady static sitting edge of bed   Standing balance support: Bilateral upper extremity supported, During functional activity Standing balance-Leahy Scale: Fair Standing balance comment:  min A +2 for standing balance                            Cognition  Arousal/Alertness: Lethargic, Awake/alert Behavior During Therapy: Flat affect Overall Cognitive Status: Impaired/Different from baseline Area of Impairment: Orientation, Following commands, Safety/judgement, Awareness, Problem solving                 Orientation Level: Disoriented to, Place, Time, Situation Current Attention Level: Focused Memory: Decreased short-term memory Following Commands: Follows one step commands inconsistently, Follows one step commands with increased time Safety/Judgement: Decreased awareness of safety, Decreased awareness of deficits Awareness: Intellectual Problem Solving: Slow processing, Decreased initiation, Difficulty sequencing, Requires verbal cues, Requires tactile cues General Comments: Requires mod cues for direction, decreased attention to right side.        Exercises      General Comments        Pertinent Vitals/Pain Pain Assessment Pain Assessment: PAINAD Breathing: normal Negative Vocalization: none Facial Expression: smiling or inexpressive Body Language: relaxed Consolability: no need to console PAINAD Score: 0    Home Living                          Prior Function            PT Goals (current goals can now be found in the care plan section) Acute Rehab PT Goals Patient Stated Goal: to improve mobility PT Goal Formulation: With family Time For Goal Achievement: 09/17/22 Potential to Achieve Goals: Fair Progress towards PT goals: Progressing toward goals    Frequency    7X/week      PT Plan Discharge plan needs to be updated    Co-evaluation PT/OT/SLP Co-Evaluation/Treatment: Yes Reason for Co-Treatment: Necessary to address cognition/behavior during functional activity;For patient/therapist safety;To address functional/ADL transfers PT goals addressed during session: Mobility/safety with mobility;Balance        AM-PAC PT "6 Clicks" Mobility   Outcome Measure  Help needed turning from your  back to your side while in a flat bed without using bedrails?: Total Help needed moving from lying on your back to sitting on the side of a flat bed without using bedrails?: Total Help needed moving to and from a bed to a chair (including a wheelchair)?: A Lot Help needed standing up from a chair using your arms (e.g., wheelchair or bedside chair)?: A Lot Help needed to walk in hospital room?: A Lot Help needed climbing 3-5 steps with a railing? : Total 6 Click Score: 9    End of Session   Activity Tolerance: Patient limited by fatigue Patient left: in chair;with call bell/phone within reach;with chair alarm set;with family/visitor present Nurse Communication: Mobility status PT Visit Diagnosis: Other abnormalities of gait and mobility (R26.89);Muscle weakness (generalized) (M62.81);History of falling (Z91.81);Other symptoms and signs involving the nervous system (R29.898);Difficulty in walking, not elsewhere classified (R26.2);Unsteadiness on feet (R26.81)     Time: [id number] PT Time Calculation (min) (ACUTE ONLY): 28 min  Charges:  $Gait Training: 8-22 mins                     Geo Slone, PT, GCS 09/06/22,11:33 AM

## 2022-09-07 DIAGNOSIS — I6381 Other cerebral infarction due to occlusion or stenosis of small artery: Secondary | ICD-10-CM | POA: Diagnosis not present

## 2022-09-07 LAB — GLUCOSE, CAPILLARY: Glucose-Capillary: 104 mg/dL — ABNORMAL HIGH (ref 70–99)

## 2022-09-07 NOTE — Progress Notes (Signed)
Physical Therapy Treatment Patient Details Name: [name] MRN: [id number] DOB: 1940/08/08 Today's Date: 09/07/2022   History of Present Illness Pt is a 82 y.o. female presenting to hospital 09/01/22 with confusion, speaking nonsensically, unsteady gait starting 3 days ago following fall.  Recent falls and difficulty speaking; chronic difficulty with R hand (related to tumor)  Imaging showing suspected acute to subacute L thalamic infarct and an additional possible smaller R thalamic infarct; also sequelae of prior L retrosigmoid craniectomy with cranioplasty and R occipital approach VP shunt catheter.  Pt admitted with acute thalamic infarction and hypertensive emergency.  PMH includes htn, HLD, CAD, diastolic CHF, asthma, brain tumor (acoustic neuroma) with resection about 7 years ago at [facility], h/o STEMI 2013 s/p stent to RCA, hydrocephalus s/p VP shunt.    PT Comments    Patient received in bed, family at bedside. She is agreeable to PT session. Interpreter used via I pad. Family member needs cues to let patient perform tasks on her own. She required min A for supine to sit. Improved initiation this session. Min A for sit to stand and Min A for ambulation in room with RW. Cues needed for safe use of AD. She continues to require cues to keep eyes open during session. Patient will continue to benefit from skilled PT to improve mobility, safety and independence.      Recommendations for follow up therapy are one component of a multi-disciplinary discharge planning process, led by the attending physician.  Recommendations may be updated based on patient status, additional functional criteria and insurance authorization.  Follow Up Recommendations  Skilled nursing-short term rehab (<3 hours/day) Can patient physically be transported by private vehicle: No   Assistance Recommended at Discharge Frequent or constant Supervision/Assistance  Patient can return home with the following A lot of  help with bathing/dressing/bathroom;Assist for transportation;Help with stairs or ramp for entrance;Assistance with cooking/housework;Direct supervision/assist for medications management;Assistance with feeding;A lot of help with walking and/or transfers   Equipment Recommendations  None recommended by PT (TBD)    Recommendations for Other Services       Precautions / Restrictions Precautions Precautions: Fall Restrictions Weight Bearing Restrictions: No     Mobility  Bed Mobility Overal bed mobility: Needs Assistance Bed Mobility: Supine to Sit     Supine to sit: Min assist     General bed mobility comments: Requires cues to keep eyes open. Does not elaborate as to why she keeps them closed. She reports she can see okay    Transfers Overall transfer level: Needs assistance Equipment used: Rolling walker (2 wheels) Transfers: Sit to/from Stand Sit to Stand: Min assist   Step pivot transfers: Min assist       General transfer comment: visual and verbal cues needed to perform tasks    Ambulation/Gait Ambulation/Gait assistance: Min assist Gait Distance (Feet): 25 Feet Assistive device: Rolling walker (2 wheels) Gait Pattern/deviations: Step-to pattern, Decreased step length - right, Decreased step length - left, Decreased stride length, Shuffle Gait velocity: decreased     General Gait Details: short, shuffle steps. Reliant on B UE support   Stairs             Wheelchair Mobility    Modified Rankin (Stroke Patients Only)       Balance Overall balance assessment: Needs assistance Sitting-balance support: Feet supported Sitting balance-Leahy Scale: Good     Standing balance support: Bilateral upper extremity supported, During functional activity, Reliant on assistive device for balance Standing balance-Leahy  Scale: Good Standing balance comment: no lob with mobility                            Cognition Arousal/Alertness:  Awake/alert Behavior During Therapy: Flat affect Overall Cognitive Status: Impaired/Different from baseline Area of Impairment: Orientation, Following commands, Safety/judgement, Awareness, Problem solving                 Orientation Level: Disoriented to, Place, Time, Situation Current Attention Level: Focused Memory: Decreased short-term memory Following Commands: Follows one step commands inconsistently, Follows one step commands with increased time Safety/Judgement: Decreased awareness of safety, Decreased awareness of deficits Awareness: Intellectual Problem Solving: Slow processing, Decreased initiation, Difficulty sequencing, Requires verbal cues, Requires tactile cues General Comments: Requires mod cues for direction, decreased attention to right side. Requires questions/directions to be repeated at times        Exercises      General Comments        Pertinent Vitals/Pain Pain Assessment Pain Assessment: No/denies pain    Home Living                          Prior Function            PT Goals (current goals can now be found in the care plan section) Acute Rehab PT Goals Patient Stated Goal: to improve mobility PT Goal Formulation: With family Time For Goal Achievement: 09/17/22 Potential to Achieve Goals: Fair Progress towards PT goals: Progressing toward goals    Frequency    7X/week      PT Plan Current plan remains appropriate    Co-evaluation              AM-PAC PT "6 Clicks" Mobility   Outcome Measure  Help needed turning from your back to your side while in a flat bed without using bedrails?: A Little Help needed moving from lying on your back to sitting on the side of a flat bed without using bedrails?: A Little Help needed moving to and from a bed to a chair (including a wheelchair)?: A Little Help needed standing up from a chair using your arms (e.g., wheelchair or bedside chair)?: A Little Help needed to walk in  hospital room?: A Little Help needed climbing 3-5 steps with a railing? : Total 6 Click Score: 16    End of Session Equipment Utilized During Treatment: Gait belt Activity Tolerance: Patient tolerated treatment well Patient left: in chair;with call bell/phone within reach;with family/visitor present Nurse Communication: Mobility status PT Visit Diagnosis: Other abnormalities of gait and mobility (R26.89);Muscle weakness (generalized) (M62.81);History of falling (Z91.81);Other symptoms and signs involving the nervous system (R29.898);Difficulty in walking, not elsewhere classified (R26.2);Unsteadiness on feet (R26.81)     Time: [id number] PT Time Calculation (min) (ACUTE ONLY): 23 min  Charges:  $Gait Training: 23-37 mins                     Kathalene Sporer, PT, GCS 09/07/22,1:35 PM

## 2022-09-07 NOTE — Care Management Important Message (Signed)
Important Message  Patient Details  Name: [name] MRN: [id number] Date of Birth: 01/11/1941   Medicare Important Message Given:  Yes  Reviewed Medicare IM with Hoang Vo, son, at [phone].  Copy of Medicare IM left in patient's room to reference, explained to friend in room with help of language line interpreter services.    [name] 09/07/2022, 11:59 AM

## 2022-09-07 NOTE — TOC Progression Note (Signed)
Transition of Care [facility]) - Progression Note    Patient Details  Name: [name] MRN: [id number] Date of Birth: 1941-04-07  Transition of Care [facility]) CM/SW Contact  Laurena Slimmer, RN Phone Number: 09/07/2022, 2:54 PM  Clinical Narrative:     Spoke with patient son regarding SNF. He is agreeable to Snf. He would prefer SNF for [facility] as first choice and [facility] as a second choice.   [place] PASSR obtained FL2 completed BED search started        Expected Discharge Plan and Services                                               Social Determinants of Health (SDOH) Interventions SDOH Screenings   Tobacco Use: Low Risk  (09/01/2022)    Readmission Risk Interventions     No data to display

## 2022-09-07 NOTE — Progress Notes (Deleted)
Pt refused to answer any questions verbally. Her son was bedside interpreting for me. She would only shake her head yes or no.

## 2022-09-07 NOTE — Progress Notes (Signed)
PROGRESS NOTE    [name]   [id number] DOB: 11/16/1940  DOA: 09/01/2022 Date of Service: 09/07/22 PCP: Albina Billet, MD     Brief Narrative / Hospital Course:  [name] is a 82 y.o. 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  EMS recorded a systolic blood pressure of 220. 03/02: BP as high as 190/84 with otherwise normal vitals.  Labs unremarkable.  EKG, personally viewed and interpreted showing sinus at 78 with no acute ST-T wave changes.  CT head showing acute to subacute thalamic infarct. Stroke likely happened 3 days prior given onset of symptoms followed in fall 3 days prior to presentation. Admitted to hospitalist service late evening 03/03: neurology saw patient - Continue ASA/Plavix, permissive HTN and stroke work-up. Echo done. SLP saw patient and cleared for diet.  03/04: PT/OT recs for SNF. 03/05 - 03/06: SNF placement pending. 03/06 eval for CIR bu family would like to stay local. 03/07-03/08: pend SNF     Consultants:  Neurology   Procedures: none      ASSESSMENT & PLAN:   Principal Problem:   Acute thalamic infarction ([place]) Active Problems:   CAD S/P percutaneous coronary angioplasty   Hypertensive emergency   Asthma   Chronic diastolic CHF (congestive heart failure) ([place])   S/P excision of acoustic neuroma 2014   VP (ventriculoperitoneal) shunt status for hydrocephalus  Acute thalamic infarction ([place]) CT head showing acute to subacute thalamic infarct.  Echocardiogram showed ejection fraction of 60 to 65%  Carotid ultrasound showed atherosclerotic plaque involving bilateral carotid with estimated stenosis less than 50% bilaterally.Blood pressure control   Continue home atorvastatin 80  Continue ASA '81mg'$  daily, Plavix '75mg'$  daily x 3 weeks then monotherapy thereafter (patient got full dose aspirin in the ED) PT OT have recommended skilled nursing facility placement      VP (ventriculoperitoneal) shunt status for hydrocephalus Ventriculostomy catheter in correct placement per CT.   No ventriculomegaly   S/P excision of acoustic neuroma 2014 No acute issues suspected based on head CT   Chronic diastolic CHF (congestive heart failure) ([facility]) Clinically euvolemic Continue metoprolol Dose of lisinopril have been adjusted upward for adequate blood pressure control   Asthma Not acutely exacerbated Albuterol as needed   Hypertensive emergency We allowed permissive hypertension for the first 24 hours Blood pressure medications being continued at this time   CAD S/P percutaneous coronary angioplasty History of STEMI 2013 s/p bare-metal stent to RCA No complaints of chest pain, EKG nonacute Continue atorvastatin, clopidogrel, metoprolol and nitroglycerin as needed chest pain     DVT prophylaxis: lovenox Pertinent IV fluids/nutrition: po diet no IV fluids  Central lines / invasive devices: none  Code Status: FULL CODE  Current Admission Status: inpatient   TOC needs / Dispo plan: placement Barriers to discharge / significant pending items: SNF placement             Subjective / Brief ROS:  Patient reports no concerns Denies CP/SOB.    Family Communication: family at bedside on rounds     Objective Findings:  Vitals:   09/06/22 2231 09/07/22 0139 09/07/22 0344 09/07/22 0834  BP: 136/70 (!) 127/52 (!) 139/54 125/65  Pulse: 68 63 63 63  Resp: 20 18  16 16  Temp: 98.4 F (36.9 C) 97.6 F (36.4 C) 97.8 F (36.6 C) 98.9 F (37.2 C)  TempSrc: Oral Oral Oral Oral  SpO2: 99% 94% 98% 95%  Weight:       No intake or output data in the 24 hours ending 09/07/22 1850  Filed Weights   09/01/22 1951  Weight: 70 kg     Examination:  Physical Exam Constitutional:      General: She is not in acute distress.    Appearance: She is not ill-appearing.  Cardiovascular:     Rate and Rhythm: Normal rate and regular rhythm.  Pulmonary:     Effort: Pulmonary effort is normal.     Breath sounds: Normal breath sounds.  Skin:    General: Skin is warm and dry.  Neurological:     Mental Status: She is alert.  Psychiatric:        Mood and Affect: Mood normal.        Behavior: Behavior normal.          Scheduled Medications:   amLODipine  5 mg Oral Daily   aspirin EC  81 mg Oral Daily   atorvastatin  80 mg Oral Daily   clopidogrel  75 mg Oral Daily   enoxaparin (LOVENOX) injection  40 mg Subcutaneous Q24H   lisinopril  20 mg Oral Daily   metoprolol tartrate  25 mg Oral BID    Continuous Infusions:   PRN Medications:  acetaminophen **OR** acetaminophen (TYLENOL) oral liquid 160 mg/5 mL **OR** acetaminophen, albuterol, influenza vaccine adjuvanted, nitroGLYCERIN  Antimicrobials from admission:  Anti-infectives (From admission, onward)    None           Data Reviewed:  I have personally reviewed the following...  CBC: Recent Labs  Lab 09/01/22 1957 09/03/22 0554 09/04/22 0715 09/05/22 0311  WBC 7.9 10.4 9.2 12.1*  NEUTROABS  --  7.6 6.3 9.0*  HGB 14.3 13.8 12.8 13.0  HCT 43.8 42.8 37.3 39.4  MCV 94.2 94.1 89.4 93.1  PLT 194 174 147* [id number]   Basic Metabolic Panel: Recent Labs  Lab 09/01/22 1957 09/03/22 0554 09/04/22 0715 09/05/22 0311  NA 139 140 137 136  K 4.0 3.5 3.5 3.6  CL 102 106 106 102  CO2 '28 24 25 25  '$ GLUCOSE 120* 114* 91 116*  BUN 25* '23 16 20  '$ CREATININE 0.97 0.78 0.71 0.75  CALCIUM 9.0 8.9 8.4* 9.0   GFR: CrCl cannot be calculated (Unknown ideal weight.). Liver Function Tests: No results for input(s): "AST", "ALT", "ALKPHOS", "BILITOT", "PROT", "ALBUMIN" in the last 168 hours. No results for input(s): "LIPASE", "AMYLASE" in the last 168 hours. No  results for input(s): "AMMONIA" in the last 168 hours. Coagulation Profile: No results for input(s): "INR", "PROTIME" in the last 168 hours. Cardiac Enzymes: No results for input(s): "CKTOTAL", "CKMB", "CKMBINDEX", "TROPONINI" in the last 168 hours. BNP (last 3 results) No results for input(s): "PROBNP" in the last 8760 hours. HbA1C: No results for input(s): "HGBA1C" in the last 72 hours. CBG: Recent Labs  Lab 09/02/22 0158 09/07/22 0347  GLUCAP 108* 104*   Lipid Profile: No results for input(s): "CHOL", "HDL", "LDLCALC", "TRIG", "CHOLHDL", "LDLDIRECT" in the last 72 hours. Thyroid Function Tests: No results for input(s): "TSH", "T4TOTAL", "FREET4", "T3FREE", "THYROIDAB" in the last 72 hours. Anemia Panel: No results for input(s): "VITAMINB12", "FOLATE", "FERRITIN", "TIBC", "IRON", "RETICCTPCT" in the last 72 hours. Most Recent Urinalysis On File:     Component Value Date/Time  COLORURINE YELLOW (A) 09/01/2022 2059   APPEARANCEUR CLEAR (A) 09/01/2022 2059   APPEARANCEUR Clear 09/22/2012 2145   LABSPEC 1.013 09/01/2022 2059   LABSPEC 1.015 09/22/2012 2145   PHURINE 7.0 09/01/2022 2059   GLUCOSEU NEGATIVE 09/01/2022 2059   GLUCOSEU Negative 09/22/2012 2145   HGBUR SMALL (A) 09/01/2022 2059   BILIRUBINUR NEGATIVE 09/01/2022 2059   BILIRUBINUR Negative 09/22/2012 2145   [place] NEGATIVE 09/01/2022 2059   PROTEINUR 100 (A) 09/01/2022 2059   NITRITE NEGATIVE 09/01/2022 2059   LEUKOCYTESUR NEGATIVE 09/01/2022 2059   LEUKOCYTESUR Trace 09/22/2012 2145   Sepsis Labs: '@LABRCNTIP'$ (procalcitonin:4,lacticidven:4) Microbiology: No results found for this or any previous visit (from the past 240 hour(s)).    Radiology Studies last 3 days: No results found.           LOS: 6 days      Emeterio Reeve, DO [facility] 09/07/2022, 6:50 PM    Dictation software may have been used to generate the above note. Typos may occur and escape review in typed/dictated  notes. Please contact Dr Sheppard Coil directly for clarity if needed.  Staff may message me via secure chat in [place]  but this may not receive an immediate response,  please page me for urgent matters!  If 7PM-7AM, please contact night coverage [email or web address]

## 2022-09-07 NOTE — Plan of Care (Signed)
  Problem: Health Behavior/Discharge Planning: Goal: Ability to manage health-related needs will improve Outcome: Progressing   

## 2022-09-07 NOTE — NC FL2 (Signed)
[place] LEVEL OF CARE FORM     IDENTIFICATION  Patient Name: [name] Birthdate: 07/21/1940 Sex: female Admission Date (Current Location): 09/01/2022  [facility] and [place] Number:  Engineering geologist and Address:  [facility], [address] [place], [place] [address]      Provider Number: [id number]  Attending Physician Name and Address:  Emeterio Reeve, DO  Relative Name and Phone Number:  Karel Jarvis) [phone]    Current Level of Care: Hospital Recommended Level of Care: [place] Prior Approval Number:    Date Approved/Denied:   PASRR Number: [id number]  Discharge Plan: SNF    Current Diagnoses: Patient Active Problem List   Diagnosis Date Noted   Hypertensive emergency 09/01/2022   Asthma 09/01/2022   Chronic diastolic CHF (congestive heart failure) ([place]) 09/01/2022   S/P excision of acoustic neuroma 2014 09/01/2022   VP (ventriculoperitoneal) shunt status for hydrocephalus 09/01/2022   Acute thalamic infarction ([place]) 09/01/2022   NSTEMI (non-ST elevated myocardial infarction) ([place]) 07/15/2018   ST elevation myocardial infarction (STEMI) of inferior wall ([place]) 08/21/2011   CAD S/P percutaneous coronary angioplasty 08/21/2011   Hydrocephalus ([place]) 08/21/2011   Acoustic neuroma ([place]) 08/21/2011   HTN (hypertension) 08/20/2011   Hypercholesterolemia 08/20/2011    Orientation RESPIRATION BLADDER Height & Weight     Self, Place  Normal External catheter Weight: 70 kg Height:     BEHAVIORAL SYMPTOMS/MOOD NEUROLOGICAL BOWEL NUTRITION STATUS  Other (Comment) (n/a)  (n/a) Continent Diet (heart)  AMBULATORY STATUS COMMUNICATION OF NEEDS Skin   Limited Assist Verbally Bruising (Scattered)                       Personal Care Assistance Level of Assistance  Bathing, Dressing Bathing Assistance: Limited assistance Feeding assistance: Limited assistance       Functional Limitations Info              SPECIAL CARE FACTORS FREQUENCY  PT (By licensed PT), OT (By licensed OT)     PT Frequency: Min 2x weekly OT Frequency: Min 2x weekly            Contractures Contractures Info: Not present    Additional Factors Info  Code Status, Allergies Code Status Info: FULL Allergies Info: No Known Allergies           Current Medications (09/07/2022):  This is the current hospital active medication list Current Facility-Administered Medications  Medication Dose Route Frequency Provider Last Rate Last Admin   acetaminophen (TYLENOL) tablet 650 mg  650 mg Oral Q4H PRN Athena Masse, MD   650 mg at 09/04/22 2242   Or   acetaminophen (TYLENOL) 160 MG/5ML solution 650 mg  650 mg Per Tube Q4H PRN Athena Masse, MD       Or   acetaminophen (TYLENOL) suppository 650 mg  650 mg Rectal Q4H PRN Athena Masse, MD       albuterol (PROVENTIL) (2.5 MG/3ML) 0.083% nebulizer solution 2.5 mg  2.5 mg Inhalation Q6H PRN Athena Masse, MD       amLODipine (NORVASC) tablet 5 mg  5 mg Oral Daily Marguerita Merles T, MD   5 mg at 09/07/22 0946   aspirin EC tablet 81 mg  81 mg Oral Daily Athena Masse, MD   81 mg at 09/07/22 0947   atorvastatin (LIPITOR) tablet 80 mg  80 mg Oral Daily Athena Masse, MD   80 mg at  09/07/22 0947   clopidogrel (PLAVIX) tablet 75 mg  75 mg Oral Daily Judd Gaudier V, MD   75 mg at 09/07/22 0946   enoxaparin (LOVENOX) injection 40 mg  40 mg Subcutaneous Q24H Judd Gaudier V, MD   40 mg at 09/06/22 2236   influenza vaccine adjuvanted (FLUAD) injection 0.5 mL  0.5 mL Intramuscular Prior to discharge Athena Masse, MD       lisinopril (ZESTRIL) tablet 20 mg  20 mg Oral Daily Marguerita Merles T, MD   20 mg at 09/07/22 0946   metoprolol tartrate (LOPRESSOR) tablet 25 mg  25 mg Oral BID Athena Masse, MD   25 mg at 09/07/22 0946   nitroGLYCERIN (NITROSTAT) SL tablet 0.4 mg  0.4 mg Sublingual Q5 min PRN Athena Masse, MD         Discharge Medications: Please see  discharge summary for a list of discharge medications.  Relevant Imaging Results:  Relevant Lab Results:   Additional Information # [id number]  Laurena Slimmer, RN

## 2022-09-08 DIAGNOSIS — I6381 Other cerebral infarction due to occlusion or stenosis of small artery: Secondary | ICD-10-CM | POA: Diagnosis not present

## 2022-09-08 LAB — CREATININE, SERUM
Creatinine, Ser: 0.83 mg/dL (ref 0.44–1.00)
GFR, Estimated: >60 mL/min (ref >60)

## 2022-09-08 MED ORDER — LISINOPRIL 5 MG PO TABS
5.0000 mg | ORAL_TABLET | Freq: Every day | ORAL | Status: DC
  Administered 2022-09-09 – 2022-09-12 (×4): 5 mg via ORAL
  Filled 2022-09-08 (×5): qty 1

## 2022-09-08 MED ORDER — METOPROLOL TARTRATE 25 MG PO TABS
12.5000 mg | ORAL_TABLET | Freq: Two times a day (BID) | ORAL | Status: DC
  Administered 2022-09-08 – 2022-09-13 (×10): 12.5 mg via ORAL
  Filled 2022-09-08 (×10): qty 1

## 2022-09-08 NOTE — Progress Notes (Signed)
PT Cancellation Note  Patient Details Name: [name] MRN: [id number] DOB: January 26, 1941   Cancelled Treatment:    Reason Eval/Treat Not Completed: Fatigue/lethargy limiting ability to participate (Pt asleep, does not awaken when stimualted. Will try this again later in day, try to catch pt when more alertable.)   10:31 AM, 09/08/22 [name], PT, DPT Physical Therapist - [facility]  [phone] ([place])    Sarh Kirschenbaum C 09/08/2022, 10:30 AM

## 2022-09-08 NOTE — Plan of Care (Signed)
  Problem: Education: Goal: Knowledge of disease or condition will improve Outcome: Progressing Goal: Knowledge of secondary prevention will improve (MUST DOCUMENT ALL) Outcome: Progressing   Problem: Health Behavior/Discharge Planning: Goal: Ability to manage health-related needs will improve Outcome: Progressing

## 2022-09-08 NOTE — Progress Notes (Addendum)
PROGRESS NOTE    [name]   [id number] DOB: [birth year]  DOA: 09/01/2022 Date of Service: 09/08/22 PCP: Albina Billet, MD     Brief Narrative / Hospital Course:  [name] is a 82 y.o. 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  EMS recorded a systolic blood pressure of 220. 03/02: BP as high as 190/84 with otherwise normal vitals.  Labs unremarkable.  EKG, personally viewed and interpreted showing sinus at 78 with no acute ST-T wave changes.  CT head showing acute to subacute thalamic infarct. Stroke likely happened 3 days prior given onset of symptoms followed in fall 3 days prior to presentation. Admitted to hospitalist service late evening 03/03: neurology saw patient - Continue ASA/Plavix, permissive HTN and stroke work-up. Echo done. SLP saw patient and cleared for diet.  03/04: PT/OT recs for SNF. 03/05 - 03/06: SNF placement pending. 03/06 eval for CIR bu family would like to stay local. 03/07-03/09: pend SNF     Consultants:  Neurology   Procedures: none      ASSESSMENT & PLAN:   Principal Problem:   Acute thalamic infarction ([place]) Active Problems:   CAD S/P percutaneous coronary angioplasty   Hypertensive emergency   Asthma   Chronic diastolic CHF (congestive heart failure) ([place])   S/P excision of acoustic neuroma 2014   VP (ventriculoperitoneal) shunt status for hydrocephalus  Acute thalamic infarction ([place]) CT head showing acute to subacute thalamic infarct.  Echocardiogram showed ejection fraction of 60 to 65%  Carotid ultrasound showed atherosclerotic plaque involving bilateral carotid with estimated stenosis less than 50% bilaterally.Blood pressure control   Continue home atorvastatin 80  Continue ASA '81mg'$  daily, Plavix '75mg'$  daily x 3 weeks then monotherapy thereafter (patient got full dose aspirin in the ED) PT OT have recommended skilled nursing facility placement      VP (ventriculoperitoneal) shunt status for hydrocephalus Ventriculostomy catheter in correct placement per CT.   No ventriculomegaly   S/P excision of acoustic neuroma 2014 No acute issues suspected based on head CT   Chronic diastolic CHF (congestive heart failure) (HCC) Clinically euvolemic Continue metoprolol Dose of lisinopril have been adjusted upward for adequate blood pressure control   Asthma Not acutely exacerbated Albuterol as needed   Hypertensive emergency We allowed permissive hypertension for the first 24 hours Blood pressure medications being continued at this time   CAD S/P percutaneous coronary angioplasty History of STEMI 2013 s/p bare-metal stent to RCA No complaints of chest pain, EKG nonacute Continue atorvastatin, clopidogrel, metoprolol and nitroglycerin as needed chest pain     DVT prophylaxis: lovenox Pertinent IV fluids/nutrition: po diet no IV fluids  Central lines / invasive devices: none  Code Status: FULL CODE  Current Admission Status: inpatient   TOC needs / Dispo plan: placement Barriers to discharge / significant pending items: SNF placement             Subjective / Brief ROS:  Patient reports no concerns. No concerns from RN , pt is awake w/ family and while eating    Family Communication: none at this time     Objective Findings:  Vitals:   09/07/22 2320 09/08/22 0339 09/08/22 0846 09/08/22 1306  BP: (!) 143/79 (!) 153/57 107/71 Marland Kitchen)  126/55  Pulse: 64 73 66 63  Resp: '18 15  16  '$ Temp: 97.7 F (36.5 C) 97.8 F (36.6 C)  99.3 F (37.4 C)  TempSrc:    Oral  SpO2: 96% 97% 94% 96%  Weight:  68.5 kg      Intake/Output Summary (Last 24 hours) at 09/08/2022 1326 Last data filed at 09/07/2022  2300 Gross per 24 hour  Intake 100 ml  Output --  Net 100 ml    Filed Weights   09/01/22 1951 09/08/22 0339  Weight: 70 kg 68.5 kg    Examination:  Physical Exam Constitutional:      General: She is not in acute distress.    Appearance: She is not ill-appearing.  Cardiovascular:     Rate and Rhythm: Normal rate and regular rhythm.  Pulmonary:     Effort: Pulmonary effort is normal.     Breath sounds: Normal breath sounds.  Skin:    General: Skin is warm and dry.          Scheduled Medications:   aspirin EC  81 mg Oral Daily   atorvastatin  80 mg Oral Daily   clopidogrel  75 mg Oral Daily   enoxaparin (LOVENOX) injection  40 mg Subcutaneous Q24H   lisinopril  5 mg Oral Daily   metoprolol tartrate  12.5 mg Oral BID    Continuous Infusions:   PRN Medications:  acetaminophen **OR** acetaminophen (TYLENOL) oral liquid 160 mg/5 mL **OR** acetaminophen, albuterol, influenza vaccine adjuvanted, nitroGLYCERIN  Antimicrobials from admission:  Anti-infectives (From admission, onward)    None           Data Reviewed:  I have personally reviewed the following...  CBC: Recent Labs  Lab 09/01/22 1957 09/03/22 0554 09/04/22 0715 09/05/22 0311  WBC 7.9 10.4 9.2 12.1*  NEUTROABS  --  7.6 6.3 9.0*  HGB 14.3 13.8 12.8 13.0  HCT 43.8 42.8 37.3 39.4  MCV 94.2 94.1 89.4 93.1  PLT 194 174 147* [id number]   Basic Metabolic Panel: Recent Labs  Lab 09/01/22 1957 09/03/22 0554 09/04/22 0715 09/05/22 0311 09/08/22 0419  NA 139 140 137 136  --   K 4.0 3.5 3.5 3.6  --   CL 102 106 106 102  --   CO2 '28 24 25 25  '$ --   GLUCOSE 120* 114* 91 116*  --   BUN 25* '23 16 20  '$ --   CREATININE 0.97 0.78 0.71 0.75 0.83  CALCIUM 9.0 8.9 8.4* 9.0  --    GFR: CrCl cannot be calculated (Unknown ideal weight.). Liver Function Tests: No results for input(s): "AST", "ALT", "ALKPHOS", "BILITOT", "PROT", "ALBUMIN" in the last 168 hours. No results for input(s): "LIPASE", "AMYLASE"  in the last 168 hours. No results for input(s): "AMMONIA" in the last 168 hours. Coagulation Profile: No results for input(s): "INR", "PROTIME" in the last 168 hours. Cardiac Enzymes: No results for input(s): "CKTOTAL", "CKMB", "CKMBINDEX", "TROPONINI" in the last 168 hours. BNP (last 3 results) No results for input(s): "PROBNP" in the last 8760 hours. HbA1C: No results for input(s): "HGBA1C" in the last 72 hours. CBG: Recent Labs  Lab 09/02/22 0158 09/07/22 0347  GLUCAP 108* 104*   Lipid Profile: No results for input(s): "CHOL", "HDL", "LDLCALC", "TRIG", "CHOLHDL", "LDLDIRECT" in the last 72 hours. Thyroid Function Tests: No results for input(s): "TSH", "T4TOTAL", "FREET4", "T3FREE", "THYROIDAB" in the last 72 hours. Anemia Panel: No results for input(s): "VITAMINB12", "FOLATE", "FERRITIN", "TIBC", "IRON", "RETICCTPCT" in the last 72  hours. Most Recent Urinalysis On File:     Component Value Date/Time   COLORURINE YELLOW (A) 09/01/2022 2059   APPEARANCEUR CLEAR (A) 09/01/2022 2059   APPEARANCEUR Clear 09/22/2012 2145   LABSPEC 1.013 09/01/2022 2059   LABSPEC 1.015 09/22/2012 2145   PHURINE 7.0 09/01/2022 2059   GLUCOSEU NEGATIVE 09/01/2022 2059   GLUCOSEU Negative 09/22/2012 2145   HGBUR SMALL (A) 09/01/2022 2059   BILIRUBINUR NEGATIVE 09/01/2022 2059   BILIRUBINUR Negative 09/22/2012 2145   [place] NEGATIVE 09/01/2022 2059   PROTEINUR 100 (A) 09/01/2022 2059   NITRITE NEGATIVE 09/01/2022 2059   LEUKOCYTESUR NEGATIVE 09/01/2022 2059   LEUKOCYTESUR Trace 09/22/2012 2145   Sepsis Labs: '@LABRCNTIP'$ (procalcitonin:4,lacticidven:4) Microbiology: No results found for this or any previous visit (from the past 240 hour(s)).    Radiology Studies last 3 days: No results found.           LOS: 7 days      Emeterio Reeve, DO [facility] 09/08/2022, 1:26 PM    Dictation software may have been used to generate the above note. Typos may occur and escape  review in typed/dictated notes. Please contact Dr Sheppard Coil directly for clarity if needed.  Staff may message me via secure chat in [place]  but this may not receive an immediate response,  please page me for urgent matters!  If 7PM-7AM, please contact night coverage [email or web address]

## 2022-09-08 NOTE — Progress Notes (Signed)
Physical Therapy Treatment Patient Details Name: [name] MRN: [id number] DOB: Jan 18, 1941 Today's Date: 09/08/2022   History of Present Illness [name] is a 82 y.o. female presenting to hospital 09/01/22 with confusion, speaking nonsensically, unsteady gait starting 3 days ago following fall.  Recent falls and difficulty speaking; chronic difficulty with R hand (related to tumor)  Imaging showing suspected acute to subacute L thalamic infarct and an additional possible smaller R thalamic infarct; also sequelae of prior L retrosigmoid craniectomy with cranioplasty and R occipital approach VP shunt catheter.  Pt admitted with acute thalamic infarction and hypertensive emergency.  PMH includes htn, HLD, CAD, diastolic CHF, asthma, brain tumor (acoustic neuroma) with resection about 7 years ago at [facility], h/o STEMI 2013 s/p stent to RCA, hydrocephalus s/p VP shunt.    PT Comments    Pt asleep still on 2nd attempt, did not wake recently for vitals, but with therapeutic use of son and assist with washing face, pt does become awake. Cognition remains a big mystery. Son confirms again, at baseline pt does not demonstrate such difficulty with problem solving, confusion, disorientation, etc. Pt continues to alternate eyes open and eyes clinched closed, eventually much improved by end of session, but is never able to provide a reasoning- does report vision to be fine. In bed, pt requires extensive cuing to engage with Right side of visual field, but ultimately is able in limited spurts. After transition to EOB has brief left side preference, but is able to attend to midline- I think tele interpreter at midline facilitates this. Pt remains disoriented to place, played a game where she scans room for objects to identify which is limited (most things are chairs). Engaged from right side frequently and Rt hand use encouraged for interactions and meal use. Good tolerance and balance to EOB sitting x25 minutes,  minA for coming EOB and rising to standing, also for problem solving the task of step pivot transfer. Pt appears somewhat interested in soup when presented takes 2 bites while visiting- [place] Curia encourages son to continue to bring foods of interest of pt's palate, also encouraged food/fluids at mealtime. Pt up to chair at [place], son at bedside.     Recommendations for follow up therapy are one component of a multi-disciplinary discharge planning process, led by the attending physician.  Recommendations may be updated based on patient status, additional functional criteria and insurance authorization.  Follow Up Recommendations  Skilled nursing-short term rehab (<3 hours/day) Can patient physically be transported by private vehicle: No   Assistance Recommended at Discharge Frequent or constant Supervision/Assistance  Patient can return home with the following Assist for transportation;Help with stairs or ramp for entrance;Assistance with cooking/housework;Direct supervision/assist for medications management;Assistance with feeding;A little help with walking and/or transfers;A little help with bathing/dressing/bathroom   Equipment Recommendations       Recommendations for Other Services       Precautions / Restrictions Precautions Precautions: Fall Restrictions Weight Bearing Restrictions: No     Mobility  Bed Mobility Overal bed mobility: Needs Assistance Bed Mobility: Supine to Sit     Supine to sit: Min assist     General bed mobility comments: sits EOB for a while, balanced well, very short legs    Transfers     Transfers: Sit to/from Stand, Bed to chair/wheelchair/BSC Sit to Stand: Min guard   Step pivot transfers: Min assist       General transfer comment: 2 hand assist, cognitively difficulty, extensive cues needed  Ambulation/Gait Ambulation/Gait assistance:  (deferred, greater focus on cogntiion and screening today given alterness.)                  Stairs             Wheelchair Mobility    Modified Rankin (Stroke Patients Only)       Balance                                            Cognition Arousal/Alertness: Awake/alert Behavior During Therapy:  (asleep, but able to make alert;)                                            Exercises      General Comments        Pertinent Vitals/Pain Pain Assessment Pain Assessment: No/denies pain    Home Living                          Prior Function            PT Goals (current goals can now be found in the care plan section) Acute Rehab PT Goals Patient Stated Goal: to improve mobility PT Goal Formulation: With family Time For Goal Achievement: 09/17/22 Potential to Achieve Goals: Fair Progress towards PT goals: Not progressing toward goals - comment    Frequency    7X/week      PT Plan Current plan remains appropriate    Co-evaluation              AM-PAC PT "6 Clicks" Mobility   Outcome Measure  Help needed turning from your back to your side while in a flat bed without using bedrails?: A Lot Help needed moving from lying on your back to sitting on the side of a flat bed without using bedrails?: A Lot Help needed moving to and from a bed to a chair (including a wheelchair)?: A Lot Help needed standing up from a chair using your arms (e.g., wheelchair or bedside chair)?: A Lot Help needed to walk in hospital room?: A Lot Help needed climbing 3-5 steps with a railing? : A Lot 6 Click Score: 12    End of Session   Activity Tolerance: Patient tolerated treatment well;No increased pain Patient left: in chair;with family/visitor present;with call bell/phone within reach Nurse Communication: Mobility status PT Visit Diagnosis: Other abnormalities of gait and mobility (R26.89);Muscle weakness (generalized) (M62.81);History of falling (Z91.81);Other symptoms and signs involving the nervous system  (R29.898);Difficulty in walking, not elsewhere classified (R26.2);Unsteadiness on feet (R26.81)     Time: [id number] PT Time Calculation (min) (ACUTE ONLY): 38 min  Charges:  $Neuromuscular Re-education: 38-52 mins                    2:05 PM, 09/08/22 [name], PT, DPT Physical Therapist - [facility] - Closed  [phone] ([place])    [name] C 09/08/2022, 1:57 PM

## 2022-09-09 DIAGNOSIS — I6381 Other cerebral infarction due to occlusion or stenosis of small artery: Secondary | ICD-10-CM | POA: Diagnosis not present

## 2022-09-09 NOTE — Progress Notes (Signed)
PROGRESS NOTE    VEDHIKA [name]   [id number] DOB: 05-Dec-1940  DOA: 09/01/2022 Date of Service: 09/09/22 PCP: Albina Billet, MD     Brief Narrative / Hospital Course:  [name] is a 82 y.o. 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  EMS recorded a systolic blood pressure of 220. 03/02: BP as high as 190/84 with otherwise normal vitals.  Labs unremarkable.  EKG, personally viewed and interpreted showing sinus at 78 with no acute ST-T wave changes.  CT head showing acute to subacute thalamic infarct. Stroke likely happened 3 days prior given onset of symptoms followed in fall 3 days prior to presentation. Admitted to hospitalist service late evening 03/03: neurology saw patient - Continue ASA/Plavix, permissive HTN and stroke work-up. Echo done. SLP saw patient and cleared for diet.  03/04: PT/OT recs for SNF. 03/05 - 03/06: SNF placement pending. 03/06 eval for CIR bu family would like to stay local. 03/07-03/10: pend SNF     Consultants:  Neurology   Procedures: none      ASSESSMENT & PLAN:   Principal Problem:   Acute thalamic infarction ([place]) Active Problems:   CAD S/P percutaneous coronary angioplasty   Hypertensive emergency   Asthma   Chronic diastolic CHF (congestive heart failure) ([place])   S/P excision of acoustic neuroma 2014   VP (ventriculoperitoneal) shunt status for hydrocephalus  Acute thalamic infarction ([place]) CT head showing acute to subacute thalamic infarct.  Echocardiogram showed ejection fraction of 60 to 65%  Carotid ultrasound showed atherosclerotic plaque involving bilateral carotid with estimated stenosis less than 50% bilaterally.Blood pressure control   Continue home atorvastatin 80  Continue ASA '81mg'$  daily, Plavix '75mg'$  daily x 3 weeks then monotherapy thereafter (patient got full dose aspirin in the ED) PT OT have recommended skilled nursing facility placement      VP (ventriculoperitoneal) shunt status for hydrocephalus Ventriculostomy catheter in correct placement per CT.   No ventriculomegaly   S/P excision of acoustic neuroma 2014 No acute issues suspected based on head CT   Chronic diastolic CHF (congestive heart failure) (HCC) Clinically euvolemic Continue metoprolol Dose of lisinopril have been adjusted upward for adequate blood pressure control   Asthma Not acutely exacerbated Albuterol as needed   Hypertensive emergency We allowed permissive hypertension for the first 24 hours Blood pressure medications being continued at this time   CAD S/P percutaneous coronary angioplasty History of STEMI 2013 s/p bare-metal stent to RCA No complaints of chest pain, EKG nonacute Continue atorvastatin, clopidogrel, metoprolol and nitroglycerin as needed chest pain     DVT prophylaxis: lovenox Pertinent IV fluids/nutrition: po diet no IV fluids  Central lines / invasive devices: none  Code Status: FULL CODE  Current Admission Status: inpatient   TOC needs / Dispo plan: placement Barriers to discharge / significant pending items: SNF placement             Subjective / Brief ROS:  Patient reports no concerns. No concerns from RN , pt is awake w/ family in the room    Family Communication: family at bedside on rounds      Objective Findings:  Vitals:   09/08/22 2335 09/09/22 0337 09/09/22 0939 09/09/22 1307  BP: (!) 135/58 (!) 151/77 Marland Kitchen)  155/136 126/76  Pulse: 64 75 74 62  Resp: 16 16    Temp: 98.1 F (36.7 C) 98.5 F (36.9 C)  98.1 F (36.7 C)  TempSrc:    Oral  SpO2: 98% 98% 100% 94%  Weight:        Intake/Output Summary (Last 24 hours) at 09/09/2022 1608 Last data filed at 09/09/2022  0956 Gross per 24 hour  Intake 144 ml  Output 250 ml  Net -106 ml    Filed Weights   09/01/22 1951 09/08/22 0339  Weight: 70 kg 68.5 kg    Examination:  Physical Exam Constitutional:      General: She is not in acute distress.    Appearance: She is not ill-appearing.  Cardiovascular:     Rate and Rhythm: Normal rate and regular rhythm.  Pulmonary:     Effort: Pulmonary effort is normal.     Breath sounds: Normal breath sounds.  Skin:    General: Skin is warm and dry.  Neurological:     Mental Status: She is alert. Mental status is at baseline.          Scheduled Medications:   aspirin EC  81 mg Oral Daily   atorvastatin  80 mg Oral Daily   clopidogrel  75 mg Oral Daily   enoxaparin (LOVENOX) injection  40 mg Subcutaneous Q24H   lisinopril  5 mg Oral Daily   metoprolol tartrate  12.5 mg Oral BID    Continuous Infusions:   PRN Medications:  acetaminophen **OR** acetaminophen (TYLENOL) oral liquid 160 mg/5 mL **OR** acetaminophen, albuterol, influenza vaccine adjuvanted, nitroGLYCERIN  Antimicrobials from admission:  Anti-infectives (From admission, onward)    None           Data Reviewed:  I have personally reviewed the following...  CBC: Recent Labs  Lab 09/03/22 0554 09/04/22 0715 09/05/22 0311  WBC 10.4 9.2 12.1*  NEUTROABS 7.6 6.3 9.0*  HGB 13.8 12.8 13.0  HCT 42.8 37.3 39.4  MCV 94.1 89.4 93.1  PLT 174 147* [id number]   Basic Metabolic Panel: Recent Labs  Lab 09/03/22 0554 09/04/22 0715 09/05/22 0311 09/08/22 0419  NA 140 137 136  --   K 3.5 3.5 3.6  --   CL 106 106 102  --   CO2 '24 25 25  '$ --   GLUCOSE 114* 91 116*  --   BUN '23 16 20  '$ --   CREATININE 0.78 0.71 0.75 0.83  CALCIUM 8.9 8.4* 9.0  --    GFR: CrCl cannot be calculated (Unknown ideal weight.). Liver Function Tests: No results for input(s): "AST", "ALT", "ALKPHOS", "BILITOT", "PROT", "ALBUMIN" in the last 168 hours. No results for input(s): "LIPASE", "AMYLASE" in  the last 168 hours. No results for input(s): "AMMONIA" in the last 168 hours. Coagulation Profile: No results for input(s): "INR", "PROTIME" in the last 168 hours. Cardiac Enzymes: No results for input(s): "CKTOTAL", "CKMB", "CKMBINDEX", "TROPONINI" in the last 168 hours. BNP (last 3 results) No results for input(s): "PROBNP" in the last 8760 hours. HbA1C: No results for input(s): "HGBA1C" in the last 72 hours. CBG: Recent Labs  Lab 09/07/22 0347  GLUCAP 104*   Lipid Profile: No results for input(s): "CHOL", "HDL", "LDLCALC", "TRIG", "CHOLHDL", "LDLDIRECT" in the last 72 hours. Thyroid Function Tests: No results for input(s): "TSH", "T4TOTAL", "FREET4", "T3FREE", "THYROIDAB" in the last 72 hours. Anemia Panel: No results for input(s): "VITAMINB12", "FOLATE", "FERRITIN", "TIBC", "IRON", "RETICCTPCT" in the last 72 hours. Most Recent Urinalysis On File:  Component Value Date/Time   COLORURINE YELLOW (A) 09/01/2022 2059   APPEARANCEUR CLEAR (A) 09/01/2022 2059   APPEARANCEUR Clear 09/22/2012 2145   LABSPEC 1.013 09/01/2022 2059   LABSPEC 1.015 09/22/2012 2145   PHURINE 7.0 09/01/2022 2059   GLUCOSEU NEGATIVE 09/01/2022 2059   GLUCOSEU Negative 09/22/2012 2145   HGBUR SMALL (A) 09/01/2022 2059   BILIRUBINUR NEGATIVE 09/01/2022 2059   BILIRUBINUR Negative 09/22/2012 2145   [place] NEGATIVE 09/01/2022 2059   PROTEINUR 100 (A) 09/01/2022 2059   NITRITE NEGATIVE 09/01/2022 2059   LEUKOCYTESUR NEGATIVE 09/01/2022 2059   LEUKOCYTESUR Trace 09/22/2012 2145   Sepsis Labs: '@LABRCNTIP'$ (procalcitonin:4,lacticidven:4) Microbiology: No results found for this or any previous visit (from the past 240 hour(s)).    Radiology Studies last 3 days: No results found.           LOS: 8 days      Emeterio Reeve, DO [facility] 09/09/2022, 4:08 PM    Dictation software may have been used to generate the above note. Typos may occur and escape review in  typed/dictated notes. Please contact Dr Sheppard Coil directly for clarity if needed.  Staff may message me via secure chat in [place]  but this may not receive an immediate response,  please page me for urgent matters!  If 7PM-7AM, please contact night coverage [email or web address]

## 2022-09-09 NOTE — Progress Notes (Signed)
Physical Therapy Treatment Patient Details Name: [name] MRN: [id number] DOB: 13-Sep-1940 Today's Date: 09/09/2022   History of Present Illness [name] is a 82 y.o. female presenting to hospital 09/01/22 with confusion, speaking nonsensically, unsteady gait starting 3 days ago following fall.  Recent falls and difficulty speaking; chronic difficulty with R hand (related to tumor)  Imaging showing suspected acute to subacute L thalamic infarct and an additional possible smaller R thalamic infarct; also sequelae of prior L retrosigmoid craniectomy with cranioplasty and R occipital approach VP shunt catheter.  Pt admitted with acute thalamic infarction and hypertensive emergency.  PMH includes htn, HLD, CAD, diastolic CHF, asthma, brain tumor (acoustic neuroma) with resection about 7 years ago at [facility], h/o STEMI 2013 s/p stent to RCA, hydrocephalus s/p VP shunt.    PT Comments    Pt resting in recliner upon PT arrival; agreeable to physical therapy.  [place] interpreter Edythe Lynn [phone] utilized for session (video interpreter).  During session pt CGA to min assist with transfers and CGA to min assist to ambulate 100 feet with RW use (pt with short shuffling steps and decreased cadence; limited distance ambulating d/t pt fatigue/generalized weakness).  Pt appearing alert today (eyes open most of session) and appearing to be smiling frequently during session.  Will continue to focus on strengthening, balance, and progressive functional mobility during hospitalization.    Recommendations for follow up therapy are one component of a multi-disciplinary discharge planning process, led by the attending physician.  Recommendations may be updated based on patient status, additional functional criteria and insurance authorization.  Follow Up Recommendations  Skilled nursing-short term rehab (<3 hours/day) Can patient physically be transported by private vehicle: No   Assistance Recommended at  Discharge Frequent or constant Supervision/Assistance  Patient can return home with the following Assist for transportation;Help with stairs or ramp for entrance;Assistance with cooking/housework;Direct supervision/assist for medications management;Assistance with feeding;A little help with walking and/or transfers;A little help with bathing/dressing/bathroom   Equipment Recommendations  Rolling walker (2 wheels);BSC/3in1 (youth sized)    Recommendations for Other Services       Precautions / Restrictions Precautions Precautions: Fall Restrictions Weight Bearing Restrictions: No     Mobility  Bed Mobility               General bed mobility comments: Deferred (pt sitting in recliner beginning/end of session)    Transfers Overall transfer level: Needs assistance Equipment used: Rolling walker (2 wheels) Transfers: Sit to/from Stand Sit to Stand: Min guard, Min assist           General transfer comment: vc's and tactile cues for positioning/technique    Ambulation/Gait Ambulation/Gait assistance: Min guard, Min assist Gait Distance (Feet): 100 Feet Assistive device: Rolling walker (2 wheels)   Gait velocity: decreased     General Gait Details: short shuffling steps (mild improvement with vc's to take longer steps)   Stairs             Wheelchair Mobility    Modified Rankin (Stroke Patients Only)       Balance Overall balance assessment: Needs assistance Sitting-balance support: No upper extremity supported, Feet supported Sitting balance-Leahy Scale: Good Sitting balance - Comments: steady sitting reaching within BOS   Standing balance support: Bilateral upper extremity supported, During functional activity, Reliant on assistive device for balance Standing balance-Leahy Scale: Good Standing balance comment: no loss of balance with ambulation  Cognition Arousal/Alertness: Awake/alert Behavior During  Therapy: WFL for tasks assessed/performed Overall Cognitive Status: Impaired/Different from baseline Area of Impairment: Orientation, Following commands, Safety/judgement, Awareness, Problem solving                 Orientation Level: Disoriented to, Time, Situation Current Attention Level: Focused Memory: Decreased short-term memory Following Commands: Follows one step commands inconsistently, Follows one step commands with increased time Safety/Judgement: Decreased awareness of safety, Decreased awareness of deficits Awareness: Intellectual Problem Solving: Slow processing, Decreased initiation, Difficulty sequencing, Requires verbal cues, Requires tactile cues          Exercises      General Comments  Nursing cleared pt for participation in physical therapy.  Pt agreeable to PT session.      Pertinent Vitals/Pain Pain Assessment Pain Assessment: Faces Faces Pain Scale: Hurts a little bit (pt reporting 10/10 but pt did not appear to be in distress: nurse notified) Pain Location: "front" (pt did not specify further when asked for more specifics via interpreter) Pain Descriptors / Indicators: Discomfort Pain Intervention(s): Limited activity within patient's tolerance, Monitored during session, Repositioned Vitals (HR and O2 on room air) stable and WFL throughout treatment session.    Home Living                          Prior Function            PT Goals (current goals can now be found in the care plan section) Acute Rehab PT Goals Patient Stated Goal: to improve mobility PT Goal Formulation: With patient Time For Goal Achievement: 09/17/22 Potential to Achieve Goals: Good Progress towards PT goals: Progressing toward goals    Frequency    7X/week      PT Plan Current plan remains appropriate    Co-evaluation              AM-PAC PT "6 Clicks" Mobility   Outcome Measure  Help needed turning from your back to your side while in a flat  bed without using bedrails?: A Lot Help needed moving from lying on your back to sitting on the side of a flat bed without using bedrails?: A Lot Help needed moving to and from a bed to a chair (including a wheelchair)?: A Little Help needed standing up from a chair using your arms (e.g., wheelchair or bedside chair)?: A Little Help needed to walk in hospital room?: A Little Help needed climbing 3-5 steps with a railing? : A Lot 6 Click Score: 15    End of Session Equipment Utilized During Treatment: Gait belt Activity Tolerance: Patient tolerated treatment well Patient left: in chair;with call bell/phone within reach;with chair alarm set Nurse Communication: Mobility status;Precautions PT Visit Diagnosis: Other abnormalities of gait and mobility (R26.89);Muscle weakness (generalized) (M62.81);History of falling (Z91.81);Other symptoms and signs involving the nervous system (R29.898);Difficulty in walking, not elsewhere classified (R26.2);Unsteadiness on feet (R26.81)     Time: [id number] PT Time Calculation (min) (ACUTE ONLY): 33 min  Charges:  $Gait Training: 8-22 mins $Therapeutic Activity: 8-22 mins                     Leitha Bleak, PT 09/09/22, 12:30 PM

## 2022-09-10 DIAGNOSIS — I6381 Other cerebral infarction due to occlusion or stenosis of small artery: Secondary | ICD-10-CM | POA: Diagnosis not present

## 2022-09-10 MED ORDER — ATORVASTATIN CALCIUM 80 MG PO TABS: 80.0000 mg | ORAL_TABLET | Freq: Every day | ORAL | 0 refills | Status: DC

## 2022-09-10 MED ORDER — ASPIRIN 81 MG PO TBEC: 81.0000 mg | DELAYED_RELEASE_TABLET | Freq: Every day | ORAL | 0 refills | Status: DC

## 2022-09-10 MED ORDER — LISINOPRIL 5 MG PO TABS: 5.0000 mg | ORAL_TABLET | Freq: Every day | ORAL | 0 refills | Status: DC

## 2022-09-10 MED ORDER — METOPROLOL TARTRATE 25 MG PO TABS: 12.5000 mg | ORAL_TABLET | Freq: Two times a day (BID) | ORAL | 0 refills | Status: DC

## 2022-09-10 MED ORDER — NITROGLYCERIN 0.4 MG SL SUBL: 0.4000 mg | SUBLINGUAL_TABLET | SUBLINGUAL | 0 refills | Status: DC | PRN

## 2022-09-10 MED ORDER — CLOPIDOGREL BISULFATE 75 MG PO TABS: 75.0000 mg | ORAL_TABLET | Freq: Every day | ORAL | 0 refills | Status: DC

## 2022-09-10 NOTE — TOC Progression Note (Addendum)
Transition of Care [facility]) - CM/SW Discharge Note   Patient Details  Name: ZYKIRIA GUTTILLA MRN: [id number] Date of Birth: [birth year]  [facility] of [facility]) CM/SW Contact:  Laurena Slimmer, RN Phone Number: 09/10/2022, 10:22 AM   Clinical Narrative:    Patient has bed offer for [place] with Tora Kindred in admissions at [facility].Marland Kitchen She will confirm if patient can be admitted to facility today.  MD notified.   11:26pm Per Lavella Lemons at [facility] Patient can not be accepted. Bed offer rescinded  11:27pm Spoke with Gena at [employer]. Regarding bed offer. Request pending. Gena will speak with administration regarding  confirmation of bed offer.          Patient Goals and CMS Choice      Discharge Placement                         Discharge Plan and Services Additional resources added to the After Visit Summary for                                       Social Determinants of Health (SDOH) Interventions SDOH Screenings   Tobacco Use: Low Risk  (09/01/2022)     Readmission Risk Interventions     No data to display

## 2022-09-10 NOTE — Plan of Care (Signed)

## 2022-09-10 NOTE — Progress Notes (Signed)
Occupational Therapy Treatment Patient Details Name: [name] MRN: [id number] DOB: 09-12-1940 Today's Date: 09/10/2022   History of present illness [name] is a 82 y.o. female presenting to hospital 09/01/22 with confusion, speaking nonsensically, unsteady gait starting 3 days ago following fall.  Recent falls and difficulty speaking; chronic difficulty with R hand (related to tumor)  Imaging showing suspected acute to subacute L thalamic infarct and an additional possible smaller R thalamic infarct; also sequelae of prior L retrosigmoid craniectomy with cranioplasty and R occipital approach VP shunt catheter.  Pt admitted with acute thalamic infarction and hypertensive emergency.  PMH includes htn, HLD, CAD, diastolic CHF, asthma, brain tumor (acoustic neuroma) with resection about 7 years ago at [facility], h/o STEMI 2013 s/p stent to RCA, hydrocephalus s/p VP shunt.   OT comments  Upon entering the room, pt supine in bed and alert. OT utilized video interpreter, Webb Silversmith, for pt's preferred language during session. Door closed to room and TV turned off to eliminate distractions in room. Pt needing max multimodal cuing and min A for trunk support for supine >sit. Pt stands and ambulates with min A to recliner chair while holding pt's B hands. Pt able to visually find and locate recliner chair to the R side. Pt sits and positioned for comfort. Focus on forced used of R UE with red resistive theraputty. Hand over hand assistance to initiate movement but pt really having a hard time following commands to initiate tasks during session. She report her name correctly but is unable to verbalize month, location, or situation. Pt remains in recliner chair at end of session with chair alarm activated and call bell within reach.   Recommendations for follow up therapy are one component of a multi-disciplinary discharge planning process, led by the attending physician.  Recommendations may be updated based on  patient status, additional functional criteria and insurance authorization.    Follow Up Recommendations  Skilled nursing-short term rehab (<3 hours/day)     Assistance Recommended at Discharge Frequent or constant Supervision/Assistance  Patient can return home with the following  Two people to help with bathing/dressing/bathroom;Two people to help with walking and/or transfers;Help with stairs or ramp for entrance;Assist for transportation;Assistance with cooking/housework   Equipment Recommendations  Other (comment) (defer to next venue of care)       Precautions / Restrictions Precautions Precautions: Fall Restrictions Weight Bearing Restrictions: No       Mobility Bed Mobility Overal bed mobility: Needs Assistance Bed Mobility: Supine to Sit     Supine to sit: Min assist     General bed mobility comments: max multimodal cuing and trunk support to exit bed    Transfers Overall transfer level: Needs assistance Equipment used: 1 person hand held assist Transfers: Sit to/from Stand, Bed to chair/wheelchair/BSC Sit to Stand: Min assist     Step pivot transfers: Min assist           Balance Overall balance assessment: Needs assistance Sitting-balance support: No upper extremity supported, Feet supported Sitting balance-Leahy Scale: Good     Standing balance support: Bilateral upper extremity supported, During functional activity Standing balance-Leahy Scale: Fair                             ADL either performed or assessed with clinical judgement      Cognition Arousal/Alertness: Awake/alert Behavior During Therapy: Flat affect Overall Cognitive Status: Impaired/Different from baseline Area of Impairment: Orientation, Following commands,  Safety/judgement, Awareness, Problem solving                 Orientation Level: Disoriented to, Time, Situation Current Attention Level: Focused Memory: Decreased short-term memory Following  Commands: Follows one step commands inconsistently, Follows one step commands with increased time Safety/Judgement: Decreased awareness of safety, Decreased awareness of deficits Awareness: Intellectual                       Pertinent Vitals/ Pain       Pain Assessment Pain Assessment: Faces Faces Pain Scale: Hurts a little bit Pain Location: generalized Pain Descriptors / Indicators: Discomfort Pain Intervention(s): Limited activity within patient's tolerance, Monitored during session, Repositioned         Frequency  Min 3X/week        Progress Toward Goals  OT Goals(current goals can now be found in the care plan section)  Progress towards OT goals: Progressing toward goals     Plan Discharge plan needs to be updated;Frequency remains appropriate       AM-PAC OT "6 Clicks" Daily Activity     Outcome Measure   Help from another person eating meals?: A Lot Help from another person taking care of personal grooming?: A Lot Help from another person toileting, which includes using toliet, bedpan, or urinal?: A Lot Help from another person bathing (including washing, rinsing, drying)?: A Lot Help from another person to put on and taking off regular upper body clothing?: A Lot Help from another person to put on and taking off regular lower body clothing?: A Lot 6 Click Score: 12    End of Session    OT Visit Diagnosis: Unsteadiness on feet (R26.81);Muscle weakness (generalized) (M62.81);Repeated falls (R29.6);Hemiplegia and hemiparesis Hemiplegia - Right/Left: Right Hemiplegia - dominant/non-dominant: Dominant Hemiplegia - caused by: Cerebral infarction   Activity Tolerance Patient tolerated treatment well   Patient Left in chair;with chair alarm set;with call bell/phone within reach   Nurse Communication Mobility status        Time: 1443-1500 OT Time Calculation (min): 17 min  Charges: OT General Charges $OT Visit: 1 Visit OT  Treatments $Neuromuscular Re-education: 8-22 mins  Darleen Crocker, MS, OTR/L , CBIS ascom [phone]  09/10/22, 4:06 PM

## 2022-09-10 NOTE — Progress Notes (Signed)
PROGRESS NOTE    [name]   [id number] DOB: 1940-09-02  DOA: 09/01/2022 Date of Service: 09/10/22 PCP: Albina Billet, MD     Brief Narrative / Hospital Course:  [name] is a 82 y.o. 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  EMS recorded a systolic blood pressure of 220. 03/02: BP as high as 190/84 with otherwise normal vitals.  Labs unremarkable.  EKG, personally viewed and interpreted showing sinus at 78 with no acute ST-T wave changes.  CT head showing acute to subacute thalamic infarct. Stroke likely happened 3 days prior given onset of symptoms followed in fall 3 days prior to presentation. Admitted to hospitalist service late evening 03/03: neurology saw patient - Continue ASA/Plavix, permissive HTN and stroke work-up. Echo done. SLP saw patient and cleared for diet.  03/04: PT/OT recs for SNF. 03/05 - 03/06: SNF placement pending. 03/06 eval for CIR bu family would like to stay local. 03/07-03/11: pend SNF     Consultants:  Neurology   Procedures: none      ASSESSMENT & PLAN:   Principal Problem:   Acute thalamic infarction ([place]) Active Problems:   CAD S/P percutaneous coronary angioplasty   Hypertensive emergency   Asthma   Chronic diastolic CHF (congestive heart failure) ([place])   S/P excision of acoustic neuroma 2014   VP (ventriculoperitoneal) shunt status for hydrocephalus  Acute thalamic infarction ([place]) CT head showing acute to subacute thalamic infarct.  Echocardiogram showed ejection fraction of 60 to 65%  Carotid ultrasound showed atherosclerotic plaque involving bilateral carotid with estimated stenosis less than 50% bilaterally.Blood pressure control   Continue home atorvastatin 80  Continue ASA '81mg'$  daily, Plavix '75mg'$  daily x 3 weeks then monotherapy thereafter (patient got full dose aspirin in the ED) PT OT have recommended skilled nursing facility placement      VP (ventriculoperitoneal) shunt status for hydrocephalus Ventriculostomy catheter in correct placement per CT.   No ventriculomegaly   S/P excision of acoustic neuroma 2014 No acute issues suspected based on head CT   Chronic diastolic CHF (congestive heart failure) (H[facility]) Clinically euvolemic Continue metoprolol Dose of lisinopril have been adjusted upward for adequate blood pressure control   Asthma Not acutely exacerbated Albuterol as needed   Hypertensive emergency We allowed permissive hypertension for the first 24 hours Blood pressure medications being continued at this time   CAD S/P percutaneous coronary angioplasty History of STEMI 2013 s/p bare-metal stent to RCA No complaints of chest pain, EKG nonacute Continue atorvastatin, clopidogrel, metoprolol and nitroglycerin as needed chest pain     DVT prophylaxis: lovenox Pertinent IV fluids/nutrition: po diet no IV fluids  Central lines / invasive devices: none  Code Status: FULL CODE  Current Admission Status: inpatient   TOC needs / Dispo plan: placement Barriers to discharge / significant pending items: SNF placement             Subjective / Brief ROS:  Patient reports no concerns. No concerns from RN , pt is awake w/ family in the room    Family Communication: family at bedside on rounds      Objective Findings:  Vitals:   09/10/22 0019 09/10/22 0308 09/10/22 0803 09/10/22 1129  BP: (!) 151/64 135/65 Marland Kitchen)  144/82 (!) 102/56  Pulse: (!) 53 60 (!) 59 62  Resp: '16 16 16 16  '$ Temp: 98.5 F (36.9 C) 98.1 F (36.7 C) 98.8 F (37.1 C) (!) 97.5 F (36.4 C)  TempSrc: Oral Oral Oral Oral  SpO2: 97% 100% 97% 97%  Weight:        Intake/Output Summary (Last 24 hours) at 09/10/2022  1608 Last data filed at 09/10/2022 1433 Gross per 24 hour  Intake 240 ml  Output --  Net 240 ml    Filed Weights   09/01/22 1951 09/08/22 0339  Weight: 70 kg 68.5 kg    Examination:  Physical Exam Constitutional:      General: She is not in acute distress.    Appearance: She is not ill-appearing.  Cardiovascular:     Rate and Rhythm: Normal rate and regular rhythm.  Pulmonary:     Effort: Pulmonary effort is normal.     Breath sounds: Normal breath sounds.  Skin:    General: Skin is warm and dry.  Neurological:     Mental Status: She is alert. Mental status is at baseline.          Scheduled Medications:   aspirin EC  81 mg Oral Daily   atorvastatin  80 mg Oral Daily   clopidogrel  75 mg Oral Daily   enoxaparin (LOVENOX) injection  40 mg Subcutaneous Q24H   lisinopril  5 mg Oral Daily   metoprolol tartrate  12.5 mg Oral BID    Continuous Infusions:   PRN Medications:  acetaminophen **OR** acetaminophen (TYLENOL) oral liquid 160 mg/5 mL **OR** acetaminophen, albuterol, influenza vaccine adjuvanted, nitroGLYCERIN  Antimicrobials from admission:  Anti-infectives (From admission, onward)    None           Data Reviewed:  I have personally reviewed the following...  CBC: Recent Labs  Lab 09/04/22 0715 09/05/22 0311  WBC 9.2 12.1*  NEUTROABS 6.3 9.0*  HGB 12.8 13.0  HCT 37.3 39.4  MCV 89.4 93.1  PLT 147* [id number]   Basic Metabolic Panel: Recent Labs  Lab 09/04/22 0715 09/05/22 0311 09/08/22 0419  NA 137 136  --   K 3.5 3.6  --   CL 106 102  --   CO2 25 25  --   GLUCOSE 91 116*  --   BUN 16 20  --   CREATININE 0.71 0.75 0.83  CALCIUM 8.4* 9.0  --    GFR: CrCl cannot be calculated (Unknown ideal weight.). Liver Function Tests: No results for input(s): "AST", "ALT", "ALKPHOS", "BILITOT", "PROT", "ALBUMIN" in the last 168 hours. No results for input(s): "LIPASE", "AMYLASE" in the last 168 hours. No results for input(s): "AMMONIA" in the  last 168 hours. Coagulation Profile: No results for input(s): "INR", "PROTIME" in the last 168 hours. Cardiac Enzymes: No results for input(s): "CKTOTAL", "CKMB", "CKMBINDEX", "TROPONINI" in the last 168 hours. BNP (last 3 results) No results for input(s): "PROBNP" in the last 8760 hours. HbA1C: No results for input(s): "HGBA1C" in the last 72 hours. CBG: Recent Labs  Lab 09/07/22 0347  GLUCAP 104*   Lipid Profile: No results for input(s): "CHOL", "HDL", "LDLCALC", "TRIG", "CHOLHDL", "LDLDIRECT" in the last 72 hours. Thyroid Function Tests: No results for input(s): "TSH", "T4TOTAL", "FREET4", "T3FREE", "THYROIDAB" in the last 72 hours. Anemia Panel: No results for input(s): "VITAMINB12", "FOLATE", "FERRITIN", "TIBC", "IRON", "RETICCTPCT" in the last 72 hours. Most Recent Urinalysis On File:     Component Value Date/Time   COLORURINE YELLOW (A)  09/01/2022 2059   APPEARANCEUR CLEAR (A) 09/01/2022 2059   APPEARANCEUR Clear 09/22/2012 2145   LABSPEC 1.013 09/01/2022 2059   LABSPEC 1.015 09/22/2012 2145   PHURINE 7.0 09/01/2022 2059   GLUCOSEU NEGATIVE 09/01/2022 2059   GLUCOSEU Negative 09/22/2012 2145   HGBUR SMALL (A) 09/01/2022 2059   BILIRUBINUR NEGATIVE 09/01/2022 2059   BILIRUBINUR Negative 09/22/2012 2145   [place] NEGATIVE 09/01/2022 2059   PROTEINUR 100 (A) 09/01/2022 2059   NITRITE NEGATIVE 09/01/2022 2059   LEUKOCYTESUR NEGATIVE 09/01/2022 2059   LEUKOCYTESUR Trace 09/22/2012 2145   Sepsis Labs: '@LABRCNTIP'$ (procalcitonin:4,lacticidven:4) Microbiology: No results found for this or any previous visit (from the past 240 hour(s)).    Radiology Studies last 3 days: No results found.           LOS: 9 days      Emeterio Reeve, DO [facility] 09/10/2022, 4:08 PM    Dictation software may have been used to generate the above note. Typos may occur and escape review in typed/dictated notes. Please contact Dr Sheppard Coil directly for clarity if  needed.  Staff may message me via secure chat in [place]  but this may not receive an immediate response,  please page me for urgent matters!  If 7PM-7AM, please contact night coverage [email or web address]

## 2022-09-10 NOTE — Progress Notes (Signed)
Physical Therapy Treatment Patient Details Name: [name] MRN: [id number] DOB: 09/02/1940 Today's Date: 09/10/2022   History of Present Illness [name] is a 82 y.o. female presenting to hospital 09/01/22 with confusion, speaking nonsensically, unsteady gait starting 3 days ago following fall.  Recent falls and difficulty speaking; chronic difficulty with R hand (related to tumor)  Imaging showing suspected acute to subacute L thalamic infarct and an additional possible smaller R thalamic infarct; also sequelae of prior L retrosigmoid craniectomy with cranioplasty and R occipital approach VP shunt catheter.  Pt admitted with acute thalamic infarction and hypertensive emergency.  PMH includes htn, HLD, CAD, diastolic CHF, asthma, brain tumor (acoustic neuroma) with resection about 7 years ago at [facility], h/o STEMI 2013 s/p stent to RCA, hydrocephalus s/p VP shunt.    PT Comments    Pt resting in recliner upon PT arrival; agreeable to therapy.  [place] interpreter Elizebeth Koller [phone] initially but video connection lost so utilized [place] interpreter Kieu [phone] rest of session.  Pt reporting mild abdominal pain (nurse notified).  During session pt CGA to min assist with transfers and ambulation 160 feet with RW use (pt with short shuffling steps R LE more than L LE).  Pt may be able to discharge home with HHPT if family able to provide 24/7 assist but otherwise pt could still benefit from SNF.    Recommendations for follow up therapy are one component of a multi-disciplinary discharge planning process, led by the attending physician.  Recommendations may be updated based on patient status, additional functional criteria and insurance authorization.  Follow Up Recommendations  Skilled nursing-short term rehab (<3 hours/day) Can patient physically be transported by private vehicle: No   Assistance Recommended at Discharge Frequent or constant Supervision/Assistance  Patient can  return home with the following Assist for transportation;Help with stairs or ramp for entrance;Assistance with cooking/housework;Direct supervision/assist for medications management;Assistance with feeding;A little help with walking and/or transfers;A little help with bathing/dressing/bathroom   Equipment Recommendations  Rolling walker (2 wheels);BSC/3in1 (youth sized)    Recommendations for Other Services       Precautions / Restrictions Precautions Precautions: Fall Restrictions Weight Bearing Restrictions: No     Mobility  Bed Mobility Overal bed mobility: Needs Assistance Bed Mobility: Sit to Supine       Sit to supine: Supervision, HOB elevated   General bed mobility comments: mild increased effort to perform on own    Transfers Overall transfer level: Needs assistance Equipment used: Rolling walker (2 wheels) Transfers: Sit to/from Stand Sit to Stand: Min guard, Min assist           General transfer comment: vc's and tactile cues for positioning/technique    Ambulation/Gait Ambulation/Gait assistance: Min guard, Min assist Gait Distance (Feet): 160 Feet Assistive device: Rolling walker (2 wheels)   Gait velocity: decreased     General Gait Details: short shuffling steps R LE more than L LE (mild improvement with vc's to take longer steps)   Stairs             Wheelchair Mobility    Modified Rankin (Stroke Patients Only)       Balance Overall balance assessment: Needs assistance Sitting-balance support: No upper extremity supported, Feet supported Sitting balance-Leahy Scale: Good Sitting balance - Comments: steady sitting reaching within BOS   Standing balance support: Bilateral upper extremity supported, During functional activity, Reliant on assistive device for balance Standing balance-Leahy Scale: Good Standing balance comment: no loss of balance with ambulation  Cognition Arousal/Alertness:  Awake/alert Behavior During Therapy: Flat affect Overall Cognitive Status: Impaired/Different from baseline Area of Impairment: Orientation, Following commands, Safety/judgement, Awareness, Problem solving                 Orientation Level: Disoriented to, Time, Situation Current Attention Level: Focused Memory: Decreased short-term memory Following Commands: Follows one step commands inconsistently, Follows one step commands with increased time Safety/Judgement: Decreased awareness of safety, Decreased awareness of deficits Awareness: Intellectual Problem Solving: Slow processing, Decreased initiation, Difficulty sequencing, Requires verbal cues, Requires tactile cues          Exercises      General Comments  Nursing cleared pt for participation in physical therapy.  Pt agreeable to PT session.      Pertinent Vitals/Pain Pain Assessment Pain Assessment: Faces Faces Pain Scale: Hurts a little bit Pain Location: abdomen Pain Descriptors / Indicators: Discomfort Pain Intervention(s): Limited activity within patient's tolerance, Monitored during session, Repositioned, Other (comment) (RN notified) Vitals (HR and O2 on room air) stable and WFL throughout treatment session.    Home Living                          Prior Function            PT Goals (current goals can now be found in the care plan section) Acute Rehab PT Goals Patient Stated Goal: to improve mobility PT Goal Formulation: With patient Time For Goal Achievement: 09/17/22 Potential to Achieve Goals: Good Progress towards PT goals: Progressing toward goals    Frequency    7X/week      PT Plan Current plan remains appropriate    Co-evaluation              AM-PAC PT "6 Clicks" Mobility   Outcome Measure  Help needed turning from your back to your side while in a flat bed without using bedrails?: A Little Help needed moving from lying on your back to sitting on the side of a  flat bed without using bedrails?: A Lot Help needed moving to and from a bed to a chair (including a wheelchair)?: A Little Help needed standing up from a chair using your arms (e.g., wheelchair or bedside chair)?: A Little Help needed to walk in hospital room?: A Little Help needed climbing 3-5 steps with a railing? : A Lot 6 Click Score: 16    End of Session Equipment Utilized During Treatment: Gait belt Activity Tolerance: Patient tolerated treatment well Patient left: in bed;with call bell/phone within reach;with bed alarm set Nurse Communication: Mobility status;Precautions;Other (comment) (pt's c/o abdominal pain) PT Visit Diagnosis: Other abnormalities of gait and mobility (R26.89);Muscle weakness (generalized) (M62.81);History of falling (Z91.81);Other symptoms and signs involving the nervous system (R29.898);Difficulty in walking, not elsewhere classified (R26.2);Unsteadiness on feet (R26.81)     Time: [id number] PT Time Calculation (min) (ACUTE ONLY): 28 min  Charges:  $Gait Training: 8-22 mins $Therapeutic Activity: 8-22 mins                     Leitha Bleak, PT 09/10/22, 3:54 PM

## 2022-09-10 NOTE — TOC Progression Note (Signed)
Transition of Care [facility]) - Progression Note    Patient Details  Name: [name] MRN: [id number] Date of Birth: 01/02/1941  [facility]) CM/SW Contact  Laurena Slimmer, RN Phone Number: 09/10/2022, 2:34 PM  Clinical Narrative:    Contacted [facility] regarding bed offer.  Bed offer not extended   2:00pm Spoke with patient's son while in patient's room. Patient does not speak or understand english. Her son was advised bed offer not extended to [facility] or [facility].  Her son was advised a bed was offered at [facility]. He is going to check on facility.           Expected Discharge Plan and Services                                               Social Determinants of Health (SDOH) Interventions SDOH Screenings   Tobacco Use: Low Risk  (09/01/2022)    Readmission Risk Interventions     No data to display

## 2022-09-11 NOTE — Progress Notes (Signed)
Physical Therapy Treatment Patient Details Name: Kylie TALFORD MRN: [id number] DOB: August 18, 1940 Today's Date: 09/11/2022   History of Present Illness [name] is a 82 y.o. female presenting to hospital 09/01/22 with confusion, speaking nonsensically, unsteady gait starting 3 days ago following fall.  Recent falls and difficulty speaking; chronic difficulty with R hand (related to tumor)  Imaging showing suspected acute to subacute L thalamic infarct and an additional possible smaller R thalamic infarct; also sequelae of prior L retrosigmoid craniectomy with cranioplasty and R occipital approach VP shunt catheter.  Pt admitted with acute thalamic infarction and hypertensive emergency.  PMH includes htn, HLD, CAD, diastolic CHF, asthma, brain tumor (acoustic neuroma) with resection about 7 years ago at [facility], h/o STEMI 2013 s/p stent to RCA, hydrocephalus s/p VP shunt.    PT Comments    Pt was side lying in bed upon arrival. Interpretor used however pt still difficult to understand at times. Untouched lunch tray at bedside. Pt unwilling to eat. Overall she is confused but was able to fully participate with session. Needs increased time to process + tactile cueing for desired task requested of her. Pt was able to exit bed, stand, and ambulate with RW. Poor overall gait kinematics with shuffling gait observed. Was able to correct but quickly reverts back to shuffling. Overall pt tolerated session well. She will greatly benefit for STR at DC to maximize her independence while decreasing caregiver burden.      Recommendations for follow up therapy are one component of a multi-disciplinary discharge planning process, led by the attending physician.  Recommendations may be updated based on patient status, additional functional criteria and insurance authorization.  Follow Up Recommendations  Skilled nursing-short term rehab (<3 hours/day)     Assistance Recommended at Discharge Frequent or constant  Supervision/Assistance  Patient can return home with the following Assist for transportation;Help with stairs or ramp for entrance;Assistance with cooking/housework;Direct supervision/assist for medications management;Assistance with feeding;A little help with walking and/or transfers;A little help with bathing/dressing/bathroom   Equipment Recommendations  Rolling walker (2 wheels);BSC/3in1 (youth/pediatric)       Precautions / Restrictions Precautions Precautions: Fall Restrictions Weight Bearing Restrictions: No     Mobility  Bed Mobility Overal bed mobility: Needs Assistance Bed Mobility: Supine to Sit, Sit to Supine  Supine to sit: Min guard Sit to supine: Supervision, HOB elevated   General bed mobility comments: CGA for safety and for helping to initiate desired movement of requested. Supervision to return to   supine after OOB activity.    Transfers Overall transfer level: Needs assistance Equipment used: Rolling walker (2 wheels) Transfers: Sit to/from Stand Sit to Stand: Min guard, Min assist  General transfer comment: CGA-min assist for safety. increased time to perform desired task due to cognition. eventually able to perform desired task with CGA-min A    Ambulation/Gait Ambulation/Gait assistance: Min guard Gait Distance (Feet): 120 Feet Assistive device: Rolling walker (2 wheels) Gait Pattern/deviations: Step-to pattern, Decreased step length - right, Decreased step length - left, Decreased stride length, Shuffle Gait velocity: decreased    General Gait Details: pt tolerated ambulation ~ 120 ft but required constant vcs for improved step quality. Shuffling gait observed. Is able to correct but quickly reverts to shuffling pattern.   Balance Overall balance assessment: Needs assistance Sitting-balance support: No upper extremity supported, Feet supported Sitting balance-Leahy Scale: Good     Standing balance support: Bilateral upper extremity supported,  During functional activity Standing balance-Leahy Scale: Fair Standing balance comment:  no loss of balance with ambulation    Cognition Arousal/Alertness: Awake/alert Behavior During Therapy: Flat affect Overall Cognitive Status: Impaired/Different from baseline      Following Commands: Follows one step commands inconsistently, Follows one step commands with increased time Safety/Judgement: Decreased awareness of safety, Decreased awareness of deficits Awareness: Intellectual Problem Solving: Slow processing, Decreased initiation, Difficulty sequencing, Requires verbal cues, Requires tactile cues General Comments: Requires mod cues for direction, decreased attention to right side. Requires questions/directions to be repeated at times               Pertinent Vitals/Pain Pain Assessment Pain Assessment: No/denies pain     PT Goals (current goals can now be found in the care plan section) Acute Rehab PT Goals Patient Stated Goal: none stated Progress towards PT goals: Progressing toward goals    Frequency    7X/week      PT Plan Current plan remains appropriate    Co-evaluation     PT goals addressed during session: Mobility/safety with mobility;Balance        AM-PAC PT "6 Clicks" Mobility   Outcome Measure  Help needed turning from your back to your side while in a flat bed without using bedrails?: A Little Help needed moving from lying on your back to sitting on the side of a flat bed without using bedrails?: A Little Help needed moving to and from a bed to a chair (including a wheelchair)?: A Little Help needed standing up from a chair using your arms (e.g., wheelchair or bedside chair)?: A Little Help needed to walk in hospital room?: A Little Help needed climbing 3-5 steps with a railing? : A Lot 6 Click Score: 17    End of Session   Activity Tolerance: Patient tolerated treatment well Patient left: in bed;with call bell/phone within reach;with bed  alarm set Nurse Communication: Mobility status PT Visit Diagnosis: Other abnormalities of gait and mobility (R26.89);Muscle weakness (generalized) (M62.81);History of falling (Z91.81);Other symptoms and signs involving the nervous system (R29.898);Difficulty in walking, not elsewhere classified (R26.2);Unsteadiness on feet (R26.81)     Time: [id number] PT Time Calculation (min) (ACUTE ONLY): 21 min  Charges:  $Gait Training: 8-22 mins                    Julaine Fusi PTA 09/11/22, 3:16 PM

## 2022-09-11 NOTE — TOC Progression Note (Signed)
Transition of Care [facility]) - Progression Note    Patient Details  Name: [name] MRN: [id number] Date of Birth: [birth year]  Transition of Care [facility]) CM/SW Contact  Laurena Slimmer, RN Phone Number: 09/11/2022, 1:47 PM  Clinical Narrative:    Spoke with patien's son Vo regarding discharge plan. He is agreeable to SNF at [facility].  Call placed to [facility] in admissions at [facility]. Left a message regarding bed availability and pending discharge. Request return call to this RNCM.         Expected Discharge Plan and Services                                               Social Determinants of Health (SDOH) Interventions SDOH Screenings   Tobacco Use: Low Risk  (09/01/2022)    Readmission Risk Interventions     No data to display

## 2022-09-11 NOTE — Progress Notes (Signed)
PROGRESS NOTE    [name]   [id number] DOB: 03-Jun-1941  DOA: 09/01/2022 Date of Service: 09/11/22 PCP: Albina Billet, MD     Brief Narrative / Hospital Course:  [name] is a 82 y.o. 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  EMS recorded a systolic blood pressure of 220. 03/02: BP as high as 190/84 with otherwise normal vitals.  Labs unremarkable.  EKG, personally viewed and interpreted showing sinus at 78 with no acute ST-T wave changes.  CT head showing acute to subacute thalamic infarct. Stroke likely happened 3 days prior given onset of symptoms followed in fall 3 days prior to presentation. Admitted to hospitalist service late evening 03/03: neurology saw patient - Continue ASA/Plavix, permissive HTN and stroke work-up. Echo done. SLP saw patient and cleared for diet.  03/04: PT/OT recs for SNF.  03/05 - 03/06: SNF placement pending.  03/06 eval for CIR bu family would like to stay local.  03/07-03/12: pend SNF     Consultants:  Neurology   Procedures: none      ASSESSMENT & PLAN:   Principal Problem:   Acute thalamic infarction ([place]) Active Problems:   CAD S/P percutaneous coronary angioplasty   Hypertensive emergency   Asthma   Chronic diastolic CHF (congestive heart failure) ([place])   S/P excision of acoustic neuroma 2014   VP (ventriculoperitoneal) shunt status for hydrocephalus  Acute thalamic infarction ([place]) CT head showing acute to subacute thalamic infarct.  Echocardiogram showed ejection fraction of 60 to 65%  Carotid ultrasound showed atherosclerotic plaque involving bilateral carotid with estimated stenosis less than 50% bilaterally.Blood pressure control   Continue home atorvastatin 80  Continue ASA '81mg'$  daily, Plavix '75mg'$  daily x 3 weeks then monotherapy thereafter (patient got full dose aspirin in the ED) SNF placement pending     VP (ventriculoperitoneal) shunt status for hydrocephalus Ventriculostomy catheter in correct placement per CT.   No ventriculomegaly   S/P excision of acoustic neuroma 2014 No acute issues suspected based on head CT   Chronic diastolic CHF (congestive heart failure) ([facility]) Clinically euvolemic Continue metoprolol Dose of lisinopril have been adjusted upward for adequate blood pressure control   Asthma Not acutely exacerbated Albuterol as needed   Hypertensive emergency We allowed permissive hypertension for the first 24 hours Blood pressure medications being continued at this time   CAD S/P percutaneous coronary angioplasty History of STEMI 2013 s/p bare-metal stent to RCA No complaints of chest pain, EKG nonacute Continue atorvastatin, clopidogrel, metoprolol and nitroglycerin as needed chest pain     DVT prophylaxis: lovenox Pertinent IV fluids/nutrition: po diet no IV fluids  Central lines / invasive devices: none  Code Status: FULL CODE  Current Admission Status: inpatient   TOC needs / Dispo plan: placement Barriers to discharge / significant pending items: SNF placement             Subjective / Brief ROS:  Patient reports no concerns. No concerns from RN , pt is awake w/ family in the room    Family Communication: family at bedside on rounds      Objective Findings:  Vitals:   09/10/22 2347 09/11/22 0416 09/11/22 1115 09/11/22 1522  BP: (!) 121/54 132/62 (!) 157/57 (!) 132/55  Pulse: 64 68 63 74  Resp: '18 18 18 18  '$ Temp: 97.7 F (36.5 C) 98.7 F (37.1 C) 98.2 F (36.8 C) 98 F (36.7 C)  TempSrc: Oral  Oral Oral  SpO2: 97% 95% 100% 97%  Weight:        Intake/Output Summary (Last 24 hours) at 09/11/2022 1706 Last data filed at 09/11/2022 1432 Gross per 24  hour  Intake 480 ml  Output --  Net 480 ml    Filed Weights   09/01/22 1951 09/08/22 0339  Weight: 70 kg 68.5 kg    Examination:  Physical Exam Constitutional:      General: She is not in acute distress.    Appearance: She is not ill-appearing.  Cardiovascular:     Rate and Rhythm: Normal rate and regular rhythm.  Pulmonary:     Effort: Pulmonary effort is normal.     Breath sounds: Normal breath sounds.  Skin:    General: Skin is warm and dry.  Neurological:     Mental Status: She is alert. Mental status is at baseline.          Scheduled Medications:   aspirin EC  81 mg Oral Daily   atorvastatin  80 mg Oral Daily   clopidogrel  75 mg Oral Daily   enoxaparin (LOVENOX) injection  40 mg Subcutaneous Q24H   lisinopril  5 mg Oral Daily   metoprolol tartrate  12.5 mg Oral BID    Continuous Infusions:   PRN Medications:  acetaminophen **OR** acetaminophen (TYLENOL) oral liquid 160 mg/5 mL **OR** acetaminophen, albuterol, influenza vaccine adjuvanted, nitroGLYCERIN  Antimicrobials from admission:  Anti-infectives (From admission, onward)    None           Data Reviewed:  I have personally reviewed the following...  CBC: Recent Labs  Lab 09/05/22 0311  WBC 12.1*  NEUTROABS 9.0*  HGB 13.0  HCT 39.4  MCV 93.1  PLT 99991111   Basic Metabolic Panel: Recent Labs  Lab 09/05/22 0311 09/08/22 0419  NA 136  --   K 3.6  --   CL 102  --   CO2 25  --   GLUCOSE 116*  --   BUN 20  --   CREATININE 0.75 0.83  CALCIUM 9.0  --    GFR: CrCl cannot be calculated (Unknown ideal weight.). Liver Function Tests: No results for input(s): "AST", "ALT", "ALKPHOS", "BILITOT", "PROT", "ALBUMIN" in the last 168 hours. No results for input(s): "LIPASE", "AMYLASE" in the last 168 hours. No results for input(s): "AMMONIA" in the last 168 hours. Coagulation Profile: No results for input(s): "INR", "PROTIME" in the last 168 hours. Cardiac Enzymes: No results for  input(s): "CKTOTAL", "CKMB", "CKMBINDEX", "TROPONINI" in the last 168 hours. BNP (last 3 results) No results for input(s): "PROBNP" in the last 8760 hours. HbA1C: No results for input(s): "HGBA1C" in the last 72 hours. CBG: Recent Labs  Lab 09/07/22 0347  GLUCAP 104*   Lipid Profile: No results for input(s): "CHOL", "HDL", "LDLCALC", "TRIG", "CHOLHDL", "LDLDIRECT" in the last 72 hours. Thyroid Function Tests: No results for input(s): "TSH", "T4TOTAL", "FREET4", "T3FREE", "THYROIDAB" in the last 72 hours. Anemia Panel: No results for input(s): "VITAMINB12", "FOLATE", "FERRITIN", "TIBC", "IRON", "RETICCTPCT" in the last 72 hours. Most Recent Urinalysis On File:     Component Value Date/Time   COLORURINE YELLOW (A) 09/01/2022 2059   APPEARANCEUR CLEAR (A) 09/01/2022 2059   APPEARANCEUR Clear 09/22/2012 2145   LABSPEC 1.013 09/01/2022 2059   LABSPEC 1.015  09/22/2012 2145   PHURINE 7.0 09/01/2022 2059   GLUCOSEU NEGATIVE 09/01/2022 2059   GLUCOSEU Negative 09/22/2012 2145   HGBUR SMALL (A) 09/01/2022 2059   BILIRUBINUR NEGATIVE 09/01/2022 2059   BILIRUBINUR Negative 09/22/2012 2145   [place] NEGATIVE 09/01/2022 2059   PROTEINUR 100 (A) 09/01/2022 2059   NITRITE NEGATIVE 09/01/2022 2059   LEUKOCYTESUR NEGATIVE 09/01/2022 2059   LEUKOCYTESUR Trace 09/22/2012 2145   Sepsis Labs: '@LABRCNTIP'$ (procalcitonin:4,lacticidven:4) Microbiology: No results found for this or any previous visit (from the past 240 hour(s)).    Radiology Studies last 3 days: No results found.           LOS: 10 days      Emeterio Reeve, DO [facility] 09/11/2022, 5:06 PM    Dictation software may have been used to generate the above note. Typos may occur and escape review in typed/dictated notes. Please contact Dr Sheppard Coil directly for clarity if needed.  Staff may message me via secure chat in [place]  but this may not receive an immediate response,  please page me for urgent  matters!  If 7PM-7AM, please contact night coverage [email or web address]

## 2022-09-12 LAB — CBC
HCT: 39.3 % (ref 36.0–46.0)
Hemoglobin: 12.9 g/dL (ref 12.0–15.0)
MCH: 30.6 pg (ref 26.0–34.0)
MCHC: 32.8 g/dL (ref 30.0–36.0)
MCV: 93.1 fL (ref 80.0–100.0)
Platelets: 270 10*3/uL (ref 150–400)
RBC: 4.22 MIL/uL (ref 3.87–5.11)
RDW: 11.9 % (ref 11.5–15.5)
WBC: 9.1 10*3/uL (ref 4.0–10.5)
nRBC: 0 % (ref 0.0–0.2)

## 2022-09-12 LAB — URINALYSIS, COMPLETE (UACMP) WITH MICROSCOPIC
Bilirubin Urine: NEGATIVE
Glucose, UA: NEGATIVE mg/dL
Ketones, ur: NEGATIVE mg/dL
Nitrite: NEGATIVE
Protein, ur: 30 mg/dL — AB
RBC / HPF: >50 RBC/hpf (ref 0–5)
Specific Gravity, Urine: 1.02 (ref 1.005–1.030)
WBC, UA: >50 WBC/hpf (ref 0–5)
pH: 5 (ref 5.0–8.0)

## 2022-09-12 LAB — BASIC METABOLIC PANEL
Anion gap: 9 (ref 5–15)
BUN: 22 mg/dL (ref 8–23)
CO2: 25 mmol/L (ref 22–32)
Calcium: 9 mg/dL (ref 8.9–10.3)
Chloride: 103 mmol/L (ref 98–111)
Creatinine, Ser: 0.81 mg/dL (ref 0.44–1.00)
GFR, Estimated: >60 mL/min (ref >60)
Glucose, Bld: 100 mg/dL — ABNORMAL HIGH (ref 70–99)
Potassium: 4.2 mmol/L (ref 3.5–5.1)
Sodium: 137 mmol/L (ref 135–145)

## 2022-09-12 LAB — GLUCOSE, CAPILLARY: Glucose-Capillary: 143 mg/dL — ABNORMAL HIGH (ref 70–99)

## 2022-09-12 LAB — MAGNESIUM: Magnesium: 2.2 mg/dL (ref 1.7–2.4)

## 2022-09-12 MED ORDER — LACTATED RINGERS IV BOLUS
500.0000 mL | Freq: Once | INTRAVENOUS | Status: AC
  Administered 2022-09-12: 500 mL via INTRAVENOUS

## 2022-09-12 NOTE — TOC Progression Note (Addendum)
Transition of Care [facility]) - Progression Note    Patient Details  Name: CUMI HAGEMANN MRN: [id number] Date of Birth: [birth year]  Transition [facility] Care [facility]) CM/SW Contact  Laurena Slimmer, RN Phone Number: 09/12/2022, 9:19 AM  Clinical Narrative:    Left a message for admissions at [facility] regarding bed offer. Request for return call to this RNCM.   3:28pm Left a message for admissions at [facility].         Expected Discharge Plan and Services                                               Social Determinants of Health (SDOH) Interventions SDOH Screenings   Tobacco Use: Low Risk  (09/01/2022)    Readmission Risk Interventions     No data to display

## 2022-09-12 NOTE — Progress Notes (Signed)
Progress Note   Patient: [name] [id number] DOB: 1940-12-10 DOA: 09/01/2022     11 DOS: the patient was seen and examined on 09/12/2022   Objective: Patient was seen this morning on rounds in the presence of physical therapist Was able to engage in conversation Currently awaiting skilled nursing facility placement Denies nausea vomiting or abdominal pain  Brief Narrative / Hospital Course:  [name] is a 82 y.o. 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  EMS recorded a systolic blood pressure of 220. 03/02: BP as high as 190/84 with otherwise normal vitals.  Labs unremarkable.  EKG, personally viewed and interpreted showing sinus at 78 with no acute ST-T wave changes.  CT head showing acute to subacute thalamic infarct. Stroke likely happened 3 days prior given onset of symptoms followed in fall 3 days prior to presentation. Admitted to hospitalist service late evening 03/03: neurology saw patient - Continue ASA/Plavix, permissive HTN and stroke work-up. Echo done. SLP saw patient and cleared for diet.  03/04: PT/OT recs for SNF. 03/05 - 03/06: SNF placement pending. 03/06 eval for CIR bu family would like to stay local. 03/07-03/11: pend SNF      Consultants:  Neurology    Procedures: none     ASSESSMENT & PLAN:   Principal Problem:   Acute thalamic infarction ([place]) Active Problems:   CAD S/P percutaneous coronary angioplasty   Hypertensive emergency   Asthma   Chronic diastolic CHF (congestive heart failure) ([place])   S/P excision of acoustic neuroma 2014   VP (ventriculoperitoneal) shunt status for hydrocephalus   Acute thalamic infarction ([place]) CT head showing acute to subacute  thalamic infarct.  Echocardiogram showed ejection fraction of 60 to 65%  Carotid ultrasound showed atherosclerotic plaque involving bilateral carotid with estimated stenosis less than 50% bilaterally.Blood pressure control  Continue home atorvastatin 80  Continue ASA '81mg'$  daily, Plavix '75mg'$  daily x 3 weeks then monotherapy thereafter (patient got full dose aspirin in the ED) PT OT have recommended skilled nursing facility placement      VP (ventriculoperitoneal) shunt status for hydrocephalus Ventriculostomy catheter in correct placement per CT.   No ventriculomegaly   S/P excision of acoustic neuroma 2014 No acute issues suspected based on head CT   Chronic diastolic CHF (congestive heart failure) (HCC) Clinically euvolemic Continue metoprolol Dose of lisinopril have been adjusted upward for adequate blood pressure control   Asthma Not acutely exacerbated Albuterol as needed   Hypertensive emergency We allowed permissive hypertension for the first 24 hours Blood pressure medications being continued at this time   CAD S/P percutaneous coronary angioplasty History of STEMI 2013 s/p bare-metal stent to RCA No complaints of chest pain, EKG nonacute Continue atorvastatin, clopidogrel, metoprolol and nitroglycerin as needed chest pain     DVT prophylaxis: lovenox Pertinent IV fluids/nutrition: po diet no IV fluids  Central lines / invasive devices: none   Code Status: FULL CODE   Current Admission Status: inpatient   TOC needs / Dispo plan: placement Barriers to discharge / significant pending items: SNF placement     Family Communication: family at bedside on rounds      Physical Exam Constitutional:      General: She is not in acute distress.  Appearance: She is not ill-appearing.  Cardiovascular:     Rate and Rhythm: Normal rate and regular rhythm.  Pulmonary:     Effort: Pulmonary effort is normal.     Breath sounds: Normal breath sounds.  Skin:    General:  Skin is warm and dry.  Neurological:     Mental Status: She is alert. Mental status is at baseline.         Vitals:   09/12/22 0849 09/12/22 0941 09/12/22 1113 09/12/22 1640  BP: 122/63 (!) 113/59 (!) 136/59 (!) 115/58  Pulse: 77 80 82 70  Resp: '18 16 18 18  '$ Temp: 99.2 F (37.3 C)  98.1 F (36.7 C) 99.1 F (37.3 C)  TempSrc:    Oral  SpO2: 99%  97% 97%  Weight:         Time spent: 36 minutes  Author: Verline Lema, MD 09/12/2022 5:50 PM  For on call review [email or web address].

## 2022-09-12 NOTE — Progress Notes (Signed)
Physical Therapy Treatment Patient Details Name: [name] MRN: [id number] DOB: 04-23-1941 Today's Date: 09/12/2022   History of Present Illness [name] is a 82 y.o. female presenting to hospital 09/01/22 with confusion, speaking nonsensically, unsteady gait starting 3 days ago following fall.  Recent falls and difficulty speaking; chronic difficulty with R hand (related to tumor)  Imaging showing suspected acute to subacute L thalamic infarct and an additional possible smaller R thalamic infarct; also sequelae of prior L retrosigmoid craniectomy with cranioplasty and R occipital approach VP shunt catheter.  Pt admitted with acute thalamic infarction and hypertensive emergency.  PMH includes htn, HLD, CAD, diastolic CHF, asthma, brain tumor (acoustic neuroma) with resection about 7 years ago at [facility], h/o STEMI 2013 s/p stent to RCA, hydrocephalus s/p VP shunt.    PT Comments    Pt was supine in bed awake however disoriented. Interpreter used however pt is very flat and does not speak often.  Currently only oriented to self. Struggles/ inconsistently able to follow simple commands. She was able to exit bed, ambulate short distance and then was reposition in recliner post session. Author assisted pt with breakfast tray set up. She was able to take several bites assisted but RN staff made aware she will need assistance with the rest of meal. Pt remains SNF appropriate to maximize independence, safety with all ADLs, while decreasing caregiver burden.     Recommendations for follow up therapy are one component of a multi-disciplinary discharge planning process, led by the attending physician.  Recommendations may be updated based on patient status, additional functional criteria and insurance authorization.  Follow Up Recommendations  Skilled nursing-short term rehab (<3 hours/day)     Assistance Recommended at Discharge Frequent or constant Supervision/Assistance  Patient can return home  with the following Assist for transportation;Help with stairs or ramp for entrance;Assistance with cooking/housework;Direct supervision/assist for medications management;Assistance with feeding;A little help with walking and/or transfers;A little help with bathing/dressing/bathroom   Equipment Recommendations  Rolling walker (2 wheels);BSC/3in1 (needs youth/ pediatric RW)       Precautions / Restrictions Precautions Precautions: Fall Restrictions Weight Bearing Restrictions: No     Mobility  Bed Mobility Overal bed mobility: Needs Assistance Bed Mobility: Supine to Sit, Sit to Supine  Supine to sit: Max assist  General bed mobility comments: Max assist due to cognition and decreased initiation of movements. Needs max assist + tc/vcs    Transfers Overall transfer level: Needs assistance Equipment used: Rolling walker (2 wheels) Transfers: Sit to/from Stand Sit to Stand: Min assist    General transfer comment: Min assist to stand from lowest bed height and from recliner. Once pt is able to cognitively/motor plan for desired task  requested, is able to perform with not much assistance however initiation of desired task is poor. INcreased time throughout to perform all task.    Ambulation/Gait Ambulation/Gait assistance: Min guard Gait Distance (Feet): 60 Feet Assistive device: Rolling walker (2 wheels) Gait Pattern/deviations: Step-to pattern, Decreased step length - right, Decreased step length - left, Decreased stride length, Shuffle Gait velocity: decreased     General Gait Details: Pt was able to ambulate however continues to have slow shuffling gait kinematics. UNable to correct. Distance limited by author due to breakfast tray arrival. Min assist to progress and maintain RW advancement    Balance Overall balance assessment: Needs assistance Sitting-balance support: No upper extremity supported, Feet supported Sitting balance-Leahy Scale: Good     Standing balance  support: Bilateral upper extremity  supported, During functional activity Standing balance-Leahy Scale: Fair       Cognition Arousal/Alertness: Awake/alert Behavior During Therapy: Flat affect Overall Cognitive Status: Impaired/Different from baseline Area of Impairment: Orientation, Following commands, Safety/judgement, Awareness, Problem solving    Orientation Level: Disoriented to, Place, Time, Situation     Following Commands: Follows one step commands inconsistently, Follows one step commands with increased time Safety/Judgement: Decreased awareness of safety, Decreased awareness of deficits Awareness: Intellectual Problem Solving: Slow processing, Decreased initiation, Difficulty sequencing, Requires verbal cues, Requires tactile cues General Comments: Pt remains confused and disoriented. Did know her name but unaware she is in hospital. Cognition greatly limits session progression               Pertinent Vitals/Pain Pain Assessment Pain Assessment: No/denies pain     PT Goals (current goals can now be found in the care plan section) Acute Rehab PT Goals Patient Stated Goal: none stated Progress towards PT goals: Progressing toward goals    Frequency    7X/week      PT Plan Current plan remains appropriate    Co-evaluation     PT goals addressed during session: Mobility/safety with mobility;Balance;Proper use of DME        AM-PAC PT "6 Clicks" Mobility   Outcome Measure  Help needed turning from your back to your side while in a flat bed without using bedrails?: A Little Help needed moving from lying on your back to sitting on the side of a flat bed without using bedrails?: A Little Help needed moving to and from a bed to a chair (including a wheelchair)?: A Little Help needed standing up from a chair using your arms (e.g., wheelchair or bedside chair)?: A Little Help needed to walk in hospital room?: A Little Help needed climbing 3-5 steps with a  railing? : A Little 6 Click Score: 18    End of Session Equipment Utilized During Treatment: Gait belt Activity Tolerance: Patient tolerated treatment well Patient left: in chair;with call bell/phone within reach;with chair alarm set Nurse Communication: Mobility status PT Visit Diagnosis: Other abnormalities of gait and mobility (R26.89);Muscle weakness (generalized) (M62.81);History of falling (Z91.81);Other symptoms and signs involving the nervous system (R29.898);Difficulty in walking, not elsewhere classified (R26.2);Unsteadiness on feet (R26.81)     Time: [id number] PT Time Calculation (min) (ACUTE ONLY): 33 min  Charges:  $Gait Training: 8-22 mins $Therapeutic Activity: 8-22 mins                    Julaine Fusi PTA 09/12/22, 11:44 AM

## 2022-09-12 NOTE — Progress Notes (Signed)
Occupational Therapy Treatment Patient Details Name: [name] MRN: [id number] DOB: 1940/09/16 Today's Date: 09/12/2022   History of present illness [name] is a 82 y.o. female presenting to hospital 09/01/22 with confusion, speaking nonsensically, unsteady gait starting 3 days ago following fall.  Recent falls and difficulty speaking; chronic difficulty with R hand (related to tumor)  Imaging showing suspected acute to subacute L thalamic infarct and an additional possible smaller R thalamic infarct; also sequelae of prior L retrosigmoid craniectomy with cranioplasty and R occipital approach VP shunt catheter.  Pt admitted with acute thalamic infarction and hypertensive emergency.  PMH includes htn, HLD, CAD, diastolic CHF, asthma, brain tumor (acoustic neuroma) with resection about 7 years ago at [facility], h/o STEMI 2013 s/p stent to RCA, hydrocephalus s/p VP shunt.   OT comments  Upon entering the room, pt supine in bed and sleeping soundly with family member present. Use of video interpreter for pt's preferred language. Pt needing additional encouragement from family for participation. Max multimodal cuing during session to initiate and sequence. Pt with increased confusion during session and family reports pt has had confusion of speech while taking to them as well. Pt stands with min A and ambulates 10' to bathroom for toileting needs. Pt unable to sequence or initiate hygiene needs after voiding. Pt found to have had passed clot in toilet and RN notified. RN arrives to room to assess clot and plans to notify MD. Pt returns to bed at with mod A and quickly returns to sleep. Call bell and all needed items within reach and bed alarm activated. Pt continues to benefit from OT intervention.    Recommendations for follow up therapy are one component of a multi-disciplinary discharge planning process, led by the attending physician.  Recommendations may be updated based on patient status,  additional functional criteria and insurance authorization.    Follow Up Recommendations  Skilled nursing-short term rehab (<3 hours/day)     Assistance Recommended at Discharge Frequent or constant Supervision/Assistance  Patient can return home with the following  Help with stairs or ramp for entrance;Assist for transportation;Assistance with cooking/housework;A lot of help with bathing/dressing/bathroom;A lot of help with walking and/or transfers;Direct supervision/assist for financial management;Direct supervision/assist for medications management   Equipment Recommendations  Other (comment) (defer to next venue of care)       Precautions / Restrictions Precautions Precautions: Fall       Mobility Bed Mobility Overal bed mobility: Needs Assistance Bed Mobility: Supine to Sit, Sit to Supine     Supine to sit: Max assist Sit to supine: Max assist   General bed mobility comments: increased assistance and cuing secondary to cognition and lethargy    Transfers Overall transfer level: Needs assistance Equipment used: 1 person hand held assist Transfers: Sit to/from Stand Sit to Stand: Min assist                 Balance Overall balance assessment: Needs assistance Sitting-balance support: No upper extremity supported, Feet supported Sitting balance-Leahy Scale: Good Sitting balance - Comments: steady sitting reaching within BOS   Standing balance support: Bilateral upper extremity supported, During functional activity Standing balance-Leahy Scale: Poor                             ADL either performed or assessed with clinical judgement   ADL Overall ADL's : Needs assistance/impaired  Toilet Transfer: Moderate assistance;Ambulation;Regular Toilet;Grab bars   Toileting- Clothing Manipulation and Hygiene: Maximal assistance;Sit to/from stand              Extremity/Trunk Assessment Upper Extremity  Assessment Upper Extremity Assessment: Difficult to assess due to impaired cognition   Lower Extremity Assessment Lower Extremity Assessment: Difficult to assess due to impaired cognition        Vision   Vision Assessment?: Vision impaired- to be further tested in functional context Additional Comments: R inattention          Cognition Arousal/Alertness: Awake/alert, Lethargic Behavior During Therapy: Flat affect Overall Cognitive Status: Impaired/Different from baseline Area of Impairment: Orientation, Following commands, Safety/judgement, Awareness, Problem solving                 Orientation Level: Disoriented to, Place, Time, Situation Current Attention Level: Focused Memory: Decreased short-term memory Following Commands: Follows one step commands inconsistently, Follows one step commands with increased time Safety/Judgement: Decreased awareness of safety, Decreased awareness of deficits Awareness: Intellectual Problem Solving: Slow processing, Decreased initiation, Difficulty sequencing, Requires verbal cues, Requires tactile cues General Comments: Pt remains confused and disoriented. Did know her name but unaware she is in hospital. Cognition greatly limits session progression                   Pertinent Vitals/ Pain       Pain Assessment Pain Assessment: Faces Faces Pain Scale: No hurt         Frequency  Min 3X/week        Progress Toward Goals  OT Goals(current goals can now be found in the care plan section)  Progress towards OT goals: Progressing toward goals     Plan Frequency remains appropriate;Discharge plan remains appropriate       AM-PAC OT "6 Clicks" Daily Activity     Outcome Measure   Help from another person eating meals?: A Lot Help from another person taking care of personal grooming?: A Lot Help from another person toileting, which includes using toliet, bedpan, or urinal?: A Lot Help from another person bathing  (including washing, rinsing, drying)?: A Lot Help from another person to put on and taking off regular upper body clothing?: A Lot Help from another person to put on and taking off regular lower body clothing?: A Lot 6 Click Score: 12    End of Session    OT Visit Diagnosis: Unsteadiness on feet (R26.81);Muscle weakness (generalized) (M62.81);Repeated falls (R29.6);Hemiplegia and hemiparesis   Activity Tolerance Patient limited by lethargy   Patient Left with call bell/phone within reach;in bed;with bed alarm set;with family/visitor present   Nurse Communication Mobility status;Other (comment) (blot clot noted to be in toilet after urination)        Time: [id number] OT Time Calculation (min): 24 min  Charges: OT General Charges $OT Visit: 1 Visit OT Treatments $Self Care/Home Management : 23-37 mins  Darleen Crocker, MS, OTR/L , CBIS ascom [phone]  09/12/22, 3:21 PM

## 2022-09-12 NOTE — Progress Notes (Signed)
       CROSS COVER NOTE  NAME: RASHIYA LOFLAND MRN: [id number] DOB : 1940/09/06    HPI/Events of Note   Report:Nurse reports routine ua sent + from leukocytes, and HGB,  No symptoms of UTI BP 88/64 with HR 52 On review of chart: Rare bacteria an micro No nitrites  Ongoing low grade temp 99.4;    Assessment and  Interventions   Assessment: Low grad temp likely atelectasis with her decreased mobilitcbg requested Plan: No need for treatment for UTI at this time 500 LR bolus  Monitor MEWS Incentive spirometer Beta blocker held [place] NP [facility]

## 2022-09-13 MED ORDER — SODIUM CHLORIDE 0.9 % IV SOLN
1.0000 g | INTRAVENOUS | Status: DC
  Administered 2022-09-13 – 2022-09-14 (×2): 1 g via INTRAVENOUS
  Filled 2022-09-13: qty 10
  Filled 2022-09-13: qty 1

## 2022-09-13 NOTE — Progress Notes (Signed)
Progress Note   Patient: [name] [id number] DOB: 16-Oct-1940 DOA: 09/01/2022     12 DOS: the patient was seen and examined on 09/13/2022     Objective: Patient was seen this morning on rounds in the presence of physical therapist Currently awaiting skilled nursing facility placement Denies nausea vomiting or abdominal pain Apparently overnight patient had a low blood pressure but responded to IV fluid bolus  Brief Narrative / Hospital Course:  [name] is a 82 y.o. 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  EMS recorded a systolic blood pressure of 220. 03/02: BP as high as 190/84 with otherwise normal vitals.  Labs unremarkable.  EKG, personally viewed and interpreted showing sinus at 78 with no acute ST-T wave changes.  CT head showing acute to subacute thalamic infarct. Stroke likely happened 3 days prior given onset of symptoms followed in fall 3 days prior to presentation. Admitted to hospitalist service late evening 03/03: neurology saw patient - Continue ASA/Plavix, permissive HTN and stroke work-up. Echo done. SLP saw patient and cleared for diet.  03/04: PT/OT recs for SNF. 03/05 - 03/06: SNF placement pending. 03/06 eval for CIR bu family would like to stay local. 03/07-03/11: pend SNF      Consultants:  Neurology    Procedures: none     ASSESSMENT & PLAN:   Principal Problem:   Acute thalamic infarction ([place]) Active Problems:   CAD S/P percutaneous coronary angioplasty   Hypertensive emergency   Asthma   Chronic diastolic CHF (congestive heart failure) ([place])   S/P excision of acoustic neuroma 2014   VP (ventriculoperitoneal) shunt status for hydrocephalus   Acute  thalamic infarction ([name]) CT head showing acute to subacute thalamic infarct.  Echocardiogram showed ejection fraction of 60 to 65%  Carotid ultrasound showed atherosclerotic plaque involving bilateral carotid with estimated stenosis less than 50% bilaterally.Blood pressure control  Continue home atorvastatin 80  Continue ASA '81mg'$  daily, Plavix '75mg'$  daily x 3 weeks then monotherapy thereafter (patient got full dose aspirin in the ED) PT OT have recommended skilled nursing facility placement  Case worker working on placement hopefully tomorrow   Urinary tract infection Initiated on ceftriaxone  VP (ventriculoperitoneal) shunt status for hydrocephalus Ventriculostomy catheter in correct placement per CT.   No ventriculomegaly   S/P excision of acoustic neuroma 2014 No acute issues suspected based on head CT   Chronic diastolic CHF (congestive heart failure) (HCC) Clinically euvolemic Continue metoprolol Dose of lisinopril have been adjusted upward for adequate blood pressure control   Asthma Not acutely exacerbated Albuterol as needed   Hypertensive emergency We allowed permissive hypertension for the first 24 hours Blood pressure medications being continued at this time   CAD S/P percutaneous coronary angioplasty History of STEMI 2013 s/p bare-metal stent to RCA No complaints of chest pain, EKG nonacute Continue atorvastatin, clopidogrel, metoprolol and nitroglycerin as needed chest pain     DVT prophylaxis: lovenox Pertinent IV fluids/nutrition: po diet no IV fluids  Central lines / invasive devices: none   Code Status: FULL CODE   Current Admission Status: inpatient   TOC needs / Dispo plan: placement Barriers to discharge / significant pending items: SNF placement   Laboratory review: I reviewed patient's urinalysis showing findings  of possible UTI  Family Communication: Discussed with patient's son over the phone     Physical Exam Constitutional:       General: She is not in acute distress.    Appearance: She is not ill-appearing.  Cardiovascular:     Rate and Rhythm: Normal rate and regular rhythm.  Pulmonary:     Effort: Pulmonary effort is normal.     Breath sounds: Normal breath sounds.  Skin:    General: Skin is warm and dry.  Neurological:     Mental Status: She is alert. Mental status is at baseline.         Vitals:   09/13/22 0420 09/13/22 0744 09/13/22 1122 09/13/22 1527  BP: (!) 109/48 123/60 (!) 102/40 (!) 115/52  Pulse: (!) 59 61 (!) 58 (!) 55  Resp: '14 16 16 17  '$ Temp: 97.8 F (36.6 C) 98.1 F (36.7 C) 98.1 F (36.7 C) 97.9 F (36.6 C)  TempSrc: Oral     SpO2: 100% 97% 95% 98%  Weight:        Time spent: 38 minutes  Author: Verline Lema, MD 09/13/2022 5:36 PM  For on call review [email or web address].

## 2022-09-13 NOTE — TOC Progression Note (Addendum)
Transition of Care [facility]) - Progression Note    Patient Details  Name: [name] MRN: [id number] Date of Birth: 24-Jul-1940  Transition of Care [facility]) CM/SW [place], LCSW Phone Number: 09/13/2022, 9:42 AM  Clinical Narrative:   Sent SNF referral to [facility] for review.  10:12 am: [facility] has offered a bed. Admissions coordinator will check to see how soon a bed will be available if stable. They use a computer program for interpreter services.  11:12 am: [facility] might have a bed available today. If not today, they will have one tomorrow. Sent secure chat to MD to notify.  12:52 pm: [facility] had a bed around 4:00 today but CSW called 6 different ambulance agencies and none of them could transport. [facility] will call if they have a cancellation. Scheduled transport will [place] for 10:00 am tomorrow. Son is aware and agreeable.  Expected Discharge Plan and Services                                               Social Determinants of Health (SDOH) Interventions SDOH Screenings   Tobacco Use: Low Risk  (09/01/2022)    Readmission Risk Interventions     No data to display

## 2022-09-13 NOTE — Plan of Care (Signed)
  Problem: Nutrition: Goal: Dietary intake will improve Outcome: Progressing   Problem: Coping: Goal: Level of anxiety will decrease Outcome: Progressing   Problem: Elimination: Goal: Will not experience complications related to bowel motility Outcome: Progressing   Problem: Safety: Goal: Ability to remain free from injury will improve Outcome: Progressing   Problem: Skin Integrity: Goal: Risk for impaired skin integrity will decrease Outcome: Progressing

## 2022-09-13 NOTE — Care Management Important Message (Signed)
Important Message  Patient Details  Name: [name] MRN: [id number] Date of Birth: [birth year]   Medicare Important Message Given:  Yes     [name] 09/13/2022, 2:29 PM

## 2022-09-13 NOTE — Progress Notes (Signed)
PT Cancellation Note  Patient Details Name: [name] MRN: [id number] DOB: 23-Apr-1941   Cancelled Treatment:     PT attempt. Pt lethargic upon arrival. She does awake but requested not to get up with PT at this time. Encouraged OOB activity but pt remains unwilling. Acute PT will continue to follow and progress per current POC.    Willette Pa 09/13/2022, 3:21 PM

## 2022-09-14 MED ORDER — LEVOFLOXACIN 500 MG PO TABS: 500.0000 mg | ORAL_TABLET | Freq: Every day | ORAL | Status: DC

## 2022-09-14 MED ORDER — ACETAMINOPHEN 325 MG PO TABS: 650.0000 mg | ORAL_TABLET | ORAL | 0 refills | Status: DC | PRN

## 2022-09-14 MED ORDER — LEVOFLOXACIN 500 MG PO TABS: 500.0000 mg | ORAL_TABLET | Freq: Every day | ORAL | Status: AC

## 2022-09-14 NOTE — Progress Notes (Signed)
Called report to Statistician at [facility] , [phone]

## 2022-09-14 NOTE — TOC Transition Note (Signed)
Transition of Care [facility]) - CM/SW Discharge Note   Patient Details  Name: [name] MRN: [id number] Date of Birth: 1941-01-21  [facility] CM/SW Contact:  Gerilyn Pilgrim, LCSW Phone Number: 09/14/2022, 8:41 AM   Clinical Narrative:   Pt transporting to [facility] health care. Transport arranged for 10 through [employer]. Medical Necessity printed to unit. Kia with [employer]. CSW will send DC summary once available.           Patient Goals and CMS Choice      Discharge Placement                         Discharge Plan and Services Additional resources added to the After Visit Summary for                                       Social Determinants of Health (SDOH) Interventions SDOH Screenings   Tobacco Use: Low Risk  (09/01/2022)     Readmission Risk Interventions     No data to display

## 2022-09-14 NOTE — Discharge Summary (Signed)
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 EMS recorded a systolic blood pressure of 220 on arrival however throughout hospitalization patient's blood pressure has improved to normal range.  Within the last 2 days patient's blood pressure medication have been withheld as blood pressure has been relatively low to normal limits. CT head showed acute to subacute thalamic infarct.  Echocardiogram showed ejection fraction of 60 to 65%  Carotid ultrasound showed atherosclerotic plaque involving bilateral carotid with estimated stenosis less than 50% bilaterally. Patient was seen by speech therapist and was cleared.  Was also seen by PT OT who recommended rehab placement.  Her mental status has since improved patient is currently cleared for discharge today.  She will have to continue Plavix and aspirin for 3 weeks and thereafter monotherapy.  Her blood pressure needs to be monitored and if found to be hypertensive again to reinitiate her blood pressure medications.   Procedures  performed: None Disposition: Rehabilitation facility Diet recommendation:  Discharge Diet Orders (From admission, onward)     Start     Ordered   09/14/22 0000  Diet - low sodium heart healthy        09/14/22 0847           Cardiac diet DISCHARGE MEDICATION: Allergies as of 09/14/2022   No Known Allergies      Medication List     STOP taking these medications    benzonatate 100 MG capsule Commonly known as: TESSALON   isosorbide mononitrate 60 MG 24 hr tablet Commonly known as: IMDUR   lisinopril 2.5 MG tablet Commonly known as: ZESTRIL   metoprolol tartrate 25 MG tablet Commonly known as: LOPRESSOR   traMADol 50 MG tablet Commonly known as: ULTRAM       TAKE these medications    acetaminophen 325 MG tablet Commonly known as: TYLENOL Take 2 tablets (650 mg total) by mouth every 4 (four) hours as needed for mild pain (or temp > 37.5 C (99.5 F)).   albuterol 108 (90 Base) MCG/ACT inhaler Commonly known as: VENTOLIN HFA Inhale 2 puffs into the lungs every 6 (six) hours as needed for wheezing or shortness of breath.   aspirin EC 81 MG tablet Take 1 tablet (81 mg total) by mouth daily. Swallow whole.   atorvastatin 80 MG tablet Commonly known as: LIPITOR Take 1 tablet (80 mg total) by mouth daily. What changed:  when to take this reasons to take this   clopidogrel 75 MG tablet Commonly known as: PLAVIX Take 1 tablet (75 mg total) by mouth daily.   levofloxacin 500 MG tablet Commonly known as: LEVAQUIN Take 1 tablet (500 mg total)  by mouth daily for 5 days. Start taking on: September 15, 2022   nitroGLYCERIN 0.4 MG SL tablet Commonly known as: NITROSTAT Place 1 tablet (0.4 mg total) under the tongue every 5 (five) minutes as needed for chest pain.        Contact information for after-discharge care     Destination     [facility] Preferred SNF .   Service: Skilled Nursing Contact information: [address]  [place] [phone]                    Discharge Exam: Physical Exam Constitutional:      General: She is not in acute distress.    Appearance: She is not ill-appearing.  Cardiovascular:     Rate and Rhythm: Normal rate and regular rhythm.  Pulmonary:     Effort: Pulmonary effort is normal.     Breath sounds: Normal breath sounds.  Skin:    General: Skin is warm and dry.  Neurological:     Mental Status: She is alert. Mental status is at baseline.      Filed Weights   09/01/22 1951 09/08/22 0339  Weight: 70 kg 68.5 kg    Condition at discharge: good  Discharge time spent: greater than 30 minutes.  Signed: Verline Lema, MD [facility] 09/14/2022

## 2023-01-31 ENCOUNTER — Encounter (HOSPITAL_COMMUNITY): Payer: Self-pay

## 2023-03-03 DEATH — deceased
